# Patient Record
Sex: Female | Born: 1990 | Race: Black or African American | Hispanic: No | Marital: Single | Smoking: Former smoker
Health system: Southern US, Community
[De-identification: ages and names within clinical notes are randomized; demographics above are authoritative.]

## PROBLEM LIST (undated history)

## (undated) ENCOUNTER — Inpatient Hospital Stay (HOSPITAL_COMMUNITY): Payer: Self-pay

## (undated) DIAGNOSIS — N83209 Unspecified ovarian cyst, unspecified side: Secondary | ICD-10-CM

## (undated) DIAGNOSIS — W3400XA Accidental discharge from unspecified firearms or gun, initial encounter: Secondary | ICD-10-CM

## (undated) DIAGNOSIS — D219 Benign neoplasm of connective and other soft tissue, unspecified: Secondary | ICD-10-CM

## (undated) DIAGNOSIS — O133 Gestational [pregnancy-induced] hypertension without significant proteinuria, third trimester: Secondary | ICD-10-CM

## (undated) DIAGNOSIS — B999 Unspecified infectious disease: Secondary | ICD-10-CM

## (undated) DIAGNOSIS — N390 Urinary tract infection, site not specified: Secondary | ICD-10-CM

---
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
## 2010-07-21 LAB — CBC
HCT: 37.7 % (ref 36.0–46.0)
Hemoglobin: 12.6 g/dL (ref 12.0–15.0)
MCHC: 33.2 g/dL (ref 30.0–36.0)
MCHC: 33.4 g/dL (ref 30.0–36.0)
MCV: 90.6 fL (ref 78.0–100.0)
MCV: 91.1 fL (ref 78.0–100.0)
Platelets: 291 10*3/uL (ref 150–400)
Platelets: 329 10*3/uL (ref 150–400)
RBC: 4.16 MIL/uL (ref 3.87–5.11)
RBC: 4.21 MIL/uL (ref 3.87–5.11)
RDW: 13.3 % (ref 11.5–15.5)
RDW: 13.6 % (ref 11.5–15.5)
WBC: 16.3 10*3/uL — ABNORMAL HIGH (ref 4.0–10.5)

## 2010-07-21 LAB — RPR: RPR Ser Ql: NONREACTIVE

## 2010-10-22 ENCOUNTER — Emergency Department (HOSPITAL_COMMUNITY)
Admission: EM | Admit: 2010-10-22 | Discharge: 2010-10-22 | Disposition: A | Discharge status: Home / Self Care | Payer: No Typology Code available for payment source | Attending: Emergency Medicine | Admitting: Emergency Medicine

## 2010-10-22 DIAGNOSIS — S139XXA Sprain of joints and ligaments of unspecified parts of neck, initial encounter: Secondary | ICD-10-CM | POA: Insufficient documentation

## 2010-10-22 DIAGNOSIS — M549 Dorsalgia, unspecified: Secondary | ICD-10-CM | POA: Insufficient documentation

## 2010-10-22 DIAGNOSIS — S335XXA Sprain of ligaments of lumbar spine, initial encounter: Secondary | ICD-10-CM | POA: Insufficient documentation

## 2010-10-22 DIAGNOSIS — M542 Cervicalgia: Secondary | ICD-10-CM | POA: Insufficient documentation

## 2011-01-24 LAB — CBC
HCT: 32.5 — ABNORMAL LOW
Hemoglobin: 10.9 — ABNORMAL LOW
MCHC: 33.4
MCV: 80.3
MCV: 80.8
RDW: 15
WBC: 17.1 — ABNORMAL HIGH

## 2011-01-24 LAB — COMPREHENSIVE METABOLIC PANEL
Alkaline Phosphatase: 169 — ABNORMAL HIGH
BUN: 1 — ABNORMAL LOW
CO2: 25
Calcium: 8.8
Creatinine, Ser: 0.47
Sodium: 136
Total Bilirubin: 0.9
Total Protein: 5.7 — ABNORMAL LOW

## 2011-01-24 LAB — URINALYSIS, DIPSTICK ONLY
Nitrite: NEGATIVE
Protein, ur: NEGATIVE
Specific Gravity, Urine: 1.01
Urobilinogen, UA: 0.2
pH: 7

## 2011-01-24 LAB — LACTATE DEHYDROGENASE: LDH: 172

## 2011-01-24 LAB — RPR: RPR Ser Ql: NONREACTIVE

## 2011-02-11 LAB — URINALYSIS, ROUTINE W REFLEX MICROSCOPIC
Hgb urine dipstick: NEGATIVE
Nitrite: NEGATIVE
Urobilinogen, UA: 1
pH: 7.5

## 2011-02-11 LAB — URINE CULTURE: Colony Count: NO GROWTH

## 2011-02-11 LAB — URINE MICROSCOPIC-ADD ON

## 2011-02-14 LAB — URINALYSIS, ROUTINE W REFLEX MICROSCOPIC
Glucose, UA: NEGATIVE
Hgb urine dipstick: NEGATIVE
Ketones, ur: 15 — AB
Protein, ur: NEGATIVE
Urobilinogen, UA: 1
pH: 6

## 2011-02-14 LAB — GC/CHLAMYDIA PROBE AMP, GENITAL
Chlamydia, DNA Probe: NEGATIVE
GC Probe Amp, Genital: NEGATIVE

## 2011-02-14 LAB — WET PREP, GENITAL: Trich, Wet Prep: NONE SEEN

## 2011-12-06 ENCOUNTER — Emergency Department (HOSPITAL_COMMUNITY)
Admission: EM | Admit: 2011-12-06 | Discharge: 2011-12-06 | Disposition: A | Discharge status: Home / Self Care | Payer: Self-pay | Attending: Emergency Medicine | Admitting: Emergency Medicine

## 2011-12-06 ENCOUNTER — Encounter (HOSPITAL_COMMUNITY): Payer: Self-pay | Admitting: *Deleted

## 2011-12-06 DIAGNOSIS — T148XXA Other injury of unspecified body region, initial encounter: Secondary | ICD-10-CM

## 2011-12-06 DIAGNOSIS — F17200 Nicotine dependence, unspecified, uncomplicated: Secondary | ICD-10-CM | POA: Insufficient documentation

## 2011-12-06 DIAGNOSIS — IMO0002 Reserved for concepts with insufficient information to code with codable children: Secondary | ICD-10-CM | POA: Insufficient documentation
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
## 2011-12-06 NOTE — ED Provider Notes (Signed)
Medical screening examination/treatment/procedure(s) were performed by non-physician practitioner and as supervising physician I was immediately available for consultation/collaboration.   Loren Racer, MD 12/06/11 2223

## 2011-12-17 ENCOUNTER — Emergency Department (HOSPITAL_COMMUNITY)
Admission: EM | Admit: 2011-12-17 | Discharge: 2011-12-17 | Disposition: A | Discharge status: Home / Self Care | Payer: No Typology Code available for payment source | Attending: Emergency Medicine | Admitting: Emergency Medicine

## 2011-12-17 ENCOUNTER — Encounter (HOSPITAL_COMMUNITY): Payer: Self-pay | Admitting: Emergency Medicine

## 2011-12-17 ENCOUNTER — Emergency Department (HOSPITAL_COMMUNITY): Payer: No Typology Code available for payment source

## 2011-12-17 DIAGNOSIS — Z043 Encounter for examination and observation following other accident: Secondary | ICD-10-CM | POA: Insufficient documentation

## 2011-12-17 DIAGNOSIS — Y93I9 Activity, other involving external motion: Secondary | ICD-10-CM | POA: Insufficient documentation

## 2011-12-17 DIAGNOSIS — Y998 Other external cause status: Secondary | ICD-10-CM | POA: Insufficient documentation

## 2011-12-17 DIAGNOSIS — F17200 Nicotine dependence, unspecified, uncomplicated: Secondary | ICD-10-CM | POA: Insufficient documentation

## 2011-12-17 MED ORDER — OXYCODONE-ACETAMINOPHEN 5-325 MG PO TABS
2.0000 | ORAL_TABLET | Freq: Once | ORAL | Status: AC
  Administered 2011-12-17: 2 via ORAL
  Filled 2011-12-17: qty 2

## 2011-12-17 MED ORDER — DIAZEPAM 5 MG PO TABS: 5.0000 mg | ORAL_TABLET | Freq: Two times a day (BID) | ORAL | Status: AC

## 2011-12-17 MED ORDER — HYDROCODONE-ACETAMINOPHEN 5-325 MG PO TABS: 2.0000 | ORAL_TABLET | Freq: Four times a day (QID) | ORAL | Status: AC | PRN
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
## 2011-12-17 NOTE — ED Notes (Signed)
Pt removed from backboard

## 2011-12-19 NOTE — ED Provider Notes (Signed)
Medical screening examination/treatment/procedure(s) were performed by non-physician practitioner and as supervising physician I was immediately available for consultation/collaboration.  Codylee Patil K Linker, MD 12/19/11 0712 

## 2012-02-06 ENCOUNTER — Observation Stay (HOSPITAL_COMMUNITY)
Admission: EM | Admit: 2012-02-06 | Discharge: 2012-02-08 | Disposition: A | Discharge status: Home / Self Care | Payer: Medicaid Other | Attending: Orthopedic Surgery | Admitting: Orthopedic Surgery

## 2012-02-06 DIAGNOSIS — S81809A Unspecified open wound, unspecified lower leg, initial encounter: Secondary | ICD-10-CM | POA: Insufficient documentation

## 2012-02-06 DIAGNOSIS — Y998 Other external cause status: Secondary | ICD-10-CM | POA: Insufficient documentation

## 2012-02-06 DIAGNOSIS — S82209B Unspecified fracture of shaft of unspecified tibia, initial encounter for open fracture type I or II: Secondary | ICD-10-CM

## 2012-02-06 DIAGNOSIS — S82409B Unspecified fracture of shaft of unspecified fibula, initial encounter for open fracture type I or II: Principal | ICD-10-CM | POA: Insufficient documentation

## 2012-02-06 DIAGNOSIS — S81009A Unspecified open wound, unspecified knee, initial encounter: Secondary | ICD-10-CM | POA: Insufficient documentation

## 2012-02-06 DIAGNOSIS — W3400XA Accidental discharge from unspecified firearms or gun, initial encounter: Secondary | ICD-10-CM

## 2012-02-06 DIAGNOSIS — F17200 Nicotine dependence, unspecified, uncomplicated: Secondary | ICD-10-CM | POA: Insufficient documentation

## 2012-02-06 DIAGNOSIS — S82899B Other fracture of unspecified lower leg, initial encounter for open fracture type I or II: Secondary | ICD-10-CM | POA: Insufficient documentation

## 2012-02-06 MED ORDER — FENTANYL CITRATE 0.05 MG/ML IJ SOLN
50.0000 ug | Freq: Once | INTRAMUSCULAR | Status: AC
  Administered 2012-02-07: 50 ug via INTRAVENOUS
  Filled 2012-02-06: qty 2
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
## 2012-02-07 ENCOUNTER — Encounter (HOSPITAL_COMMUNITY)
Admission: EM | Disposition: A | Discharge status: Home / Self Care | Payer: Self-pay | Source: Home / Self Care | Attending: Emergency Medicine

## 2012-02-07 ENCOUNTER — Observation Stay (HOSPITAL_COMMUNITY): Payer: Medicaid Other

## 2012-02-07 ENCOUNTER — Observation Stay (HOSPITAL_COMMUNITY): Payer: Medicaid Other | Admitting: Certified Registered"

## 2012-02-07 ENCOUNTER — Encounter (HOSPITAL_COMMUNITY): Payer: Self-pay | Admitting: Emergency Medicine

## 2012-02-07 ENCOUNTER — Encounter (HOSPITAL_COMMUNITY): Payer: Self-pay | Admitting: Certified Registered"

## 2012-02-07 ENCOUNTER — Emergency Department (HOSPITAL_COMMUNITY): Payer: Medicaid Other

## 2012-02-07 HISTORY — PX: ORIF TIBIA FRACTURE: SHX5416

## 2012-02-07 HISTORY — PX: I & D EXTREMITY: SHX5045

## 2012-02-07 LAB — CBC
Hemoglobin: 12.9 g/dL (ref 12.0–15.0)
MCHC: 33.6 g/dL (ref 30.0–36.0)
Platelets: 227 10*3/uL (ref 150–400)
RDW: 13 % (ref 11.5–15.5)

## 2012-02-07 LAB — TYPE AND SCREEN
ABO/RH(D): B POS
Antibody Screen: NEGATIVE

## 2012-02-07 LAB — BASIC METABOLIC PANEL
Calcium: 9 mg/dL (ref 8.4–10.5)
GFR calc Af Amer: >90 mL/min (ref >90)
GFR calc non Af Amer: >90 mL/min (ref >90)
Potassium: 3.1 mEq/L — ABNORMAL LOW (ref 3.5–5.1)
Sodium: 143 mEq/L (ref 135–145)

## 2012-02-07 SURGERY — IRRIGATION AND DEBRIDEMENT EXTREMITY
Anesthesia: General | Site: Leg Lower | Laterality: Right | Wound class: Clean

## 2012-02-07 MED ORDER — SUCCINYLCHOLINE CHLORIDE 20 MG/ML IJ SOLN
INTRAMUSCULAR | Status: DC | PRN
  Administered 2012-02-07: 100 mg via INTRAVENOUS

## 2012-02-07 MED ORDER — LACTATED RINGERS IV SOLN
INTRAVENOUS | Status: DC | PRN
  Administered 2012-02-07 (×2): via INTRAVENOUS

## 2012-02-07 MED ORDER — ONDANSETRON HCL 4 MG PO TABS: 4.0000 mg | ORAL_TABLET | Freq: Four times a day (QID) | ORAL | Status: DC | PRN

## 2012-02-07 MED ORDER — EPHEDRINE SULFATE 50 MG/ML IJ SOLN
INTRAMUSCULAR | Status: DC | PRN
  Administered 2012-02-07: 15 mg via INTRAVENOUS

## 2012-02-07 MED ORDER — OXYCODONE HCL 5 MG PO TABS: 5.0000 mg | ORAL_TABLET | Freq: Once | ORAL | Status: DC | PRN

## 2012-02-07 MED ORDER — MIDAZOLAM HCL 5 MG/5ML IJ SOLN
INTRAMUSCULAR | Status: DC | PRN
  Administered 2012-02-07: 2 mg via INTRAVENOUS

## 2012-02-07 MED ORDER — PROMETHAZINE HCL 25 MG/ML IJ SOLN
6.2500 mg | INTRAMUSCULAR | Status: DC | PRN
  Administered 2012-02-07: 6.25 mg via INTRAVENOUS

## 2012-02-07 MED ORDER — METOCLOPRAMIDE HCL 5 MG/ML IJ SOLN: 5.0000 mg | Freq: Three times a day (TID) | INTRAMUSCULAR | Status: DC | PRN

## 2012-02-07 MED ORDER — LIDOCAINE HCL (CARDIAC) 20 MG/ML IV SOLN
INTRAVENOUS | Status: DC | PRN
  Administered 2012-02-07: 70 mg via INTRAVENOUS

## 2012-02-07 MED ORDER — TETANUS-DIPHTH-ACELL PERTUSSIS 5-2.5-18.5 LF-MCG/0.5 IM SUSP
0.5000 mL | Freq: Once | INTRAMUSCULAR | Status: AC
  Administered 2012-02-07: 0.5 mL via INTRAMUSCULAR
  Filled 2012-02-07: qty 0.5

## 2012-02-07 MED ORDER — PROPOFOL 10 MG/ML IV BOLUS
INTRAVENOUS | Status: DC | PRN
  Administered 2012-02-07: 150 mg via INTRAVENOUS

## 2012-02-07 MED ORDER — SUFENTANIL CITRATE 50 MCG/ML IV SOLN
INTRAVENOUS | Status: DC | PRN
  Administered 2012-02-07: 5 ug via INTRAVENOUS
  Administered 2012-02-07: 10 ug via INTRAVENOUS
  Administered 2012-02-07: 5 ug via INTRAVENOUS

## 2012-02-07 MED ORDER — MORPHINE SULFATE 4 MG/ML IJ SOLN
4.0000 mg | Freq: Once | INTRAMUSCULAR | Status: AC
  Administered 2012-02-07: 4 mg via INTRAVENOUS
  Filled 2012-02-07: qty 1

## 2012-02-07 MED ORDER — ONDANSETRON HCL 4 MG/2ML IJ SOLN
4.0000 mg | Freq: Once | INTRAMUSCULAR | Status: AC
  Administered 2012-02-07: 4 mg via INTRAVENOUS
  Filled 2012-02-07: qty 2

## 2012-02-07 MED ORDER — POTASSIUM CHLORIDE IN NACL 20-0.9 MEQ/L-% IV SOLN
INTRAVENOUS | Status: AC
  Filled 2012-02-07 (×4): qty 1000

## 2012-02-07 MED ORDER — METHOCARBAMOL 100 MG/ML IJ SOLN: 500.0000 mg | Freq: Four times a day (QID) | INTRAVENOUS | Status: DC | PRN

## 2012-02-07 MED ORDER — HYDROMORPHONE HCL PF 1 MG/ML IJ SOLN
1.0000 mg | Freq: Once | INTRAMUSCULAR | Status: AC
  Administered 2012-02-07: 1 mg via INTRAVENOUS
  Filled 2012-02-07: qty 1

## 2012-02-07 MED ORDER — METHOCARBAMOL 500 MG PO TABS
500.0000 mg | ORAL_TABLET | Freq: Four times a day (QID) | ORAL | Status: DC | PRN
  Administered 2012-02-07 – 2012-02-08 (×5): 500 mg via ORAL
  Filled 2012-02-07 (×4): qty 1

## 2012-02-07 MED ORDER — ONDANSETRON HCL 4 MG/2ML IJ SOLN: 4.0000 mg | Freq: Four times a day (QID) | INTRAMUSCULAR | Status: DC | PRN

## 2012-02-07 MED ORDER — CEFAZOLIN SODIUM 1-5 GM-% IV SOLN
1.0000 g | Freq: Once | INTRAVENOUS | Status: AC
  Administered 2012-02-07: 1 g via INTRAVENOUS
  Filled 2012-02-07: qty 50

## 2012-02-07 MED ORDER — MEPERIDINE HCL 25 MG/ML IJ SOLN: 6.2500 mg | INTRAMUSCULAR | Status: DC | PRN

## 2012-02-07 MED ORDER — HYDROMORPHONE HCL PF 1 MG/ML IJ SOLN: 0.2500 mg | INTRAMUSCULAR | Status: DC | PRN

## 2012-02-07 MED ORDER — ASPIRIN 325 MG PO TABS
325.0000 mg | ORAL_TABLET | Freq: Every day | ORAL | Status: DC
  Administered 2012-02-07 – 2012-02-08 (×2): 325 mg via ORAL
  Filled 2012-02-07 (×2): qty 1

## 2012-02-07 MED ORDER — OXYCODONE HCL 5 MG/5ML PO SOLN: 5.0000 mg | Freq: Once | ORAL | Status: DC | PRN

## 2012-02-07 MED ORDER — MORPHINE SULFATE 2 MG/ML IJ SOLN
1.0000 mg | INTRAMUSCULAR | Status: DC | PRN
  Administered 2012-02-07 – 2012-02-08 (×3): 1 mg via INTRAVENOUS
  Filled 2012-02-07: qty 1

## 2012-02-07 MED ORDER — CEFAZOLIN SODIUM 1-5 GM-% IV SOLN
1.0000 g | Freq: Three times a day (TID) | INTRAVENOUS | Status: AC
  Administered 2012-02-07 (×2): 1 g via INTRAVENOUS
  Filled 2012-02-07 (×4): qty 50

## 2012-02-07 MED ORDER — CEFAZOLIN SODIUM-DEXTROSE 2-3 GM-% IV SOLR
INTRAVENOUS | Status: DC | PRN
  Administered 2012-02-07: 2 g via INTRAVENOUS

## 2012-02-07 MED ORDER — METOCLOPRAMIDE HCL 5 MG PO TABS
5.0000 mg | ORAL_TABLET | Freq: Three times a day (TID) | ORAL | Status: DC | PRN
  Filled 2012-02-07: qty 2

## 2012-02-07 MED ORDER — CEFAZOLIN SODIUM 1-5 GM-% IV SOLN
INTRAVENOUS | Status: AC
  Filled 2012-02-07: qty 100

## 2012-02-07 MED ORDER — OXYCODONE HCL 5 MG PO TABS
5.0000 mg | ORAL_TABLET | ORAL | Status: DC | PRN
  Administered 2012-02-07 – 2012-02-08 (×7): 10 mg via ORAL
  Filled 2012-02-07 (×6): qty 2

## 2012-02-07 SURGICAL SUPPLY — 57 items
BANDAGE ELASTIC 4 VELCRO ST LF (GAUZE/BANDAGES/DRESSINGS) ×2 IMPLANT
BANDAGE ELASTIC 6 VELCRO ST LF (GAUZE/BANDAGES/DRESSINGS) ×1 IMPLANT
BANDAGE GAUZE ELAST BULKY 4 IN (GAUZE/BANDAGES/DRESSINGS) ×1 IMPLANT
BIT DRILL 2.9X70 QC CALB (BIT) ×1 IMPLANT
BNDG COHESIVE 4X5 TAN STRL (GAUZE/BANDAGES/DRESSINGS) IMPLANT
CLOTH BEACON ORANGE TIMEOUT ST (SAFETY) ×3 IMPLANT
COVER SURGICAL LIGHT HANDLE (MISCELLANEOUS) ×3 IMPLANT
CUFF TOURNIQUET SINGLE 18IN (TOURNIQUET CUFF) ×2 IMPLANT
CUFF TOURNIQUET SINGLE 24IN (TOURNIQUET CUFF) IMPLANT
CUFF TOURNIQUET SINGLE 34IN LL (TOURNIQUET CUFF) IMPLANT
CUFF TOURNIQUET SINGLE 44IN (TOURNIQUET CUFF) IMPLANT
DRAPE U-SHAPE 47X51 STRL (DRAPES) ×4 IMPLANT
DRSG KUZMA FLUFF (GAUZE/BANDAGES/DRESSINGS) ×1 IMPLANT
DRSG PAD ABDOMINAL 8X10 ST (GAUZE/BANDAGES/DRESSINGS) ×2 IMPLANT
DURAPREP 26ML APPLICATOR (WOUND CARE) ×3 IMPLANT
ELECT REM PT RETURN 9FT ADLT (ELECTROSURGICAL)
ELECTRODE REM PT RTRN 9FT ADLT (ELECTROSURGICAL) IMPLANT
FACESHIELD LNG OPTICON STERILE (SAFETY) ×2 IMPLANT
GAUZE XEROFORM 1X8 LF (GAUZE/BANDAGES/DRESSINGS) ×2 IMPLANT
GAUZE XEROFORM 5X9 LF (GAUZE/BANDAGES/DRESSINGS) IMPLANT
GLOVE BIOGEL PI IND STRL 8 (GLOVE) ×2 IMPLANT
GLOVE BIOGEL PI INDICATOR 8 (GLOVE) ×1
GLOVE SURG ORTHO 8.0 STRL STRW (GLOVE) ×3 IMPLANT
GOWN PREVENTION PLUS LG XLONG (DISPOSABLE) IMPLANT
GOWN PREVENTION PLUS XLARGE (GOWN DISPOSABLE) ×3 IMPLANT
GOWN STRL NON-REIN LRG LVL3 (GOWN DISPOSABLE) ×5 IMPLANT
HANDPIECE INTERPULSE COAX TIP (DISPOSABLE)
K-WIRE ACE 1.6X6 (WIRE) ×3
KIT BASIN OR (CUSTOM PROCEDURE TRAY) ×3 IMPLANT
KIT ROOM TURNOVER OR (KITS) ×3 IMPLANT
KWIRE ACE 1.6X6 (WIRE) IMPLANT
MANIFOLD NEPTUNE II (INSTRUMENTS) ×3 IMPLANT
NS IRRIG 1000ML POUR BTL (IV SOLUTION) ×3 IMPLANT
PACK ORTHO EXTREMITY (CUSTOM PROCEDURE TRAY) ×3 IMPLANT
PAD ARMBOARD 7.5X6 YLW CONV (MISCELLANEOUS) ×5 IMPLANT
PAD CAST 4YDX4 CTTN HI CHSV (CAST SUPPLIES) IMPLANT
PADDING CAST COTTON 4X4 STRL (CAST SUPPLIES)
SCREW ACE CAN 4.0 36M (Screw) ×1 IMPLANT
SCREW CANN ACE 4.0X32 (Screw) ×1 IMPLANT
SET HNDPC FAN SPRY TIP SCT (DISPOSABLE) IMPLANT
SPONGE GAUZE 4X4 12PLY (GAUZE/BANDAGES/DRESSINGS) ×2 IMPLANT
SPONGE LAP 18X18 X RAY DECT (DISPOSABLE) ×7 IMPLANT
SPONGE LAP 4X18 X RAY DECT (DISPOSABLE) ×2 IMPLANT
STOCKINETTE IMPERVIOUS 9X36 MD (GAUZE/BANDAGES/DRESSINGS) ×3 IMPLANT
SUT ETHILON 2 0 FS 18 (SUTURE) IMPLANT
SUT ETHILON 3 0 PS 1 (SUTURE) IMPLANT
SUT ETHILON 4 0 PS 2 18 (SUTURE) IMPLANT
SUT PROLENE 3 0 PS 2 (SUTURE) IMPLANT
SUT VIC AB 3-0 SH 27 (SUTURE)
SUT VIC AB 3-0 SH 27X BRD (SUTURE) IMPLANT
TOWEL OR 17X24 6PK STRL BLUE (TOWEL DISPOSABLE) ×3 IMPLANT
TOWEL OR 17X26 10 PK STRL BLUE (TOWEL DISPOSABLE) ×3 IMPLANT
TUBE ANAEROBIC SPECIMEN COL (MISCELLANEOUS) IMPLANT
TUBE CONNECTING 12X1/4 (SUCTIONS) ×3 IMPLANT
UNDERPAD 30X30 INCONTINENT (UNDERPADS AND DIAPERS) ×3 IMPLANT
WATER STERILE IRR 1000ML POUR (IV SOLUTION) ×3 IMPLANT
YANKAUER SUCT BULB TIP NO VENT (SUCTIONS) ×3 IMPLANT
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
## 2012-02-07 NOTE — Progress Notes (Signed)
Orthopedic Tech Progress Note Patient Details:  Paula Gilbert 1991/01/16 [id number]  Ortho Devices Type of Ortho Device: CAM walker Ortho Device/Splint Location: right cam walker Ortho Device/Splint Interventions: Application   Cammer, Mickie Bail 02/07/2012, 9:10 AM

## 2012-02-07 NOTE — ED Notes (Signed)
Dr Dean at bedside.
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
## 2012-02-07 NOTE — Progress Notes (Signed)
Dictating addendum to the H&P ulceration at worst reviewed the CT scan and it does show the fracture extends down into the joint. This will require screw fixation in order to maintain the reduced articular surface. Don't think anything more extensive such as waiting will be required. I think screw fixation to hold the articular surface and positioned along with for 6 weeks of short leg casting should be adequate for this to heal. All questions answered patient understands the additional procedure required nor to fix the right ankle.

## 2012-02-07 NOTE — ED Notes (Signed)
Consent signed and pt to or.

## 2012-02-07 NOTE — ED Notes (Signed)
Introduced self to patient, and pt dressing to left ankle changed.
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
## 2012-02-07 NOTE — Anesthesia Preprocedure Evaluation (Addendum)
Anesthesia Evaluation  Patient identified by MRN, date of birth, ID band Patient awake, Patient confused and Patient unresponsive    Reviewed: Allergy & Precautions, H&P , NPO status   Airway Mallampati: I  Neck ROM: full    Dental  (+) Teeth Intact, Caps and Dental Advidsory Given   Pulmonary Current Smoker,  Smokes 1pk/dy X 52yrs breath sounds clear to auscultation        Cardiovascular Exercise Tolerance: Good negative cardio ROS  Rhythm:Regular Rate:Normal     Neuro/Psych negative neurological ROS     GI/Hepatic negative GI ROS, Neg liver ROS,   Endo/Other  negative endocrine ROS  Renal/GU negative Renal ROS  negative genitourinary   Musculoskeletal   Abdominal   Peds  Hematology negative hematology ROS (+)   Anesthesia Other Findings   Reproductive/Obstetrics negative OB ROS                          Anesthesia Physical Anesthesia Plan  ASA: I and Emergent  Anesthesia Plan: General   Post-op Pain Management:    Induction: Intravenous, Rapid sequence and Cricoid pressure planned  Airway Management Planned: Oral ETT  Additional Equipment:   Intra-op Plan:   Post-operative Plan: Extubation in OR  Informed Consent: I have reviewed the patients History and Physical, chart, labs and discussed the procedure including the risks, benefits and alternatives for the proposed anesthesia with the patient or authorized representative who has indicated his/her understanding and acceptance.   Dental Advisory Given and Dental advisory given  Plan Discussed with: Anesthesiologist, CRNA and Surgeon  Anesthesia Plan Comments:         Anesthesia Quick Evaluation

## 2012-02-07 NOTE — ED Notes (Signed)
Pt family informed multiple times that only 2 visitors per room, due to acuity of pts on assignment and due to patients injuires for safety

## 2012-02-07 NOTE — Anesthesia Postprocedure Evaluation (Signed)
  Anesthesia Post-op Note  Patient: Paula Gilbert  Procedure(s) Performed: Procedure(s) (LRB) with comments: IRRIGATION AND DEBRIDEMENT EXTREMITY (Bilateral) OPEN REDUCTION INTERNAL FIXATION (ORIF) TIBIA FRACTURE (Right)  Patient Location: PACU  Anesthesia Type: General  Level of Consciousness: awake, alert , oriented and patient cooperative  Airway and Oxygen Therapy: Patient Spontanous Breathing  Post-op Pain: mild  Post-op Assessment: Post-op Vital signs reviewed, Patient's Cardiovascular Status Stable, Respiratory Function Stable, Patent Airway, No signs of Nausea or vomiting and Pain level controlled  Post-op Vital Signs: Reviewed and stable  Complications: No apparent anesthesia complications

## 2012-02-07 NOTE — ED Notes (Signed)
Pt to ct 

## 2012-02-07 NOTE — Anesthesia Procedure Notes (Signed)
Procedure Name: Intubation Date/Time: 02/07/2012 6:48 AM Performed by: Alanda Amass A Pre-anesthesia Checklist: Patient identified, Timeout performed, Emergency Drugs available, Suction available and Patient being monitored Patient Re-evaluated:Patient Re-evaluated prior to inductionOxygen Delivery Method: Circle system utilized Preoxygenation: Pre-oxygenation with 100% oxygen Intubation Type: IV induction Laryngoscope Size: Mac and 3 Grade View: Grade II Tube type: Oral Tube size: 8.0 mm Number of attempts: 1 Airway Equipment and Method: Stylet

## 2012-02-08 MED ORDER — ASPIRIN 325 MG PO TABS: 325.0000 mg | ORAL_TABLET | Freq: Every day | ORAL | Status: DC

## 2012-02-08 MED ORDER — METHOCARBAMOL 500 MG PO TABS: 500.0000 mg | ORAL_TABLET | Freq: Four times a day (QID) | ORAL | Status: DC | PRN

## 2012-02-08 MED ORDER — OXYCODONE HCL 5 MG PO TABS: 5.0000 mg | ORAL_TABLET | ORAL | Status: DC | PRN
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
## 2012-02-08 NOTE — Progress Notes (Signed)
Pt stable Left leg doing well - can wb on this side rle toes mobile and perfused - sens diminished Plan dc today after PT Will need [facility] for PT and blood draws for coumadin

## 2012-02-08 NOTE — Progress Notes (Signed)
   CARE MANAGEMENT NOTE 02/08/2012  Patient:  ZOXWRUE,[id number]   Account Number:  [id number]  Date Initiated:  02/08/2012  Documentation initiated by:  [facility]  Subjective/Objective Assessment:     Action/Plan:   HH, DME   Anticipated DC Date:  02/08/2012   Anticipated DC Plan:  HOME W HOME HEALTH SERVICES      DC Planning Services  CM consult      [facility] Choice  HOME HEALTH   Choice offered to / List presented to:  C-1 Patient      DME agency  [facility]     HH arranged  HH-2 PT  HH-3 OT      [facility] agency  [facility]   Status of service:  Completed, signed off Medicare Important Message given?   (If response is "NO", the following Medicare IM given date fields will be blank) Date Medicare IM given:   Date Additional Medicare IM given:    Discharge Disposition:  HOME W HOME HEALTH SERVICES  Per UR Regulation:    If discussed at Long Length of Stay Meetings, dates discussed:    Comments:  02/08/2012 1430 NCM spoke to pt and offered choice for [facility] Pt states no preference for HH. States she does have Medicaid. Pt listed as self-pay. Provided Unit RN with [facility] contact info to add to pt's d/c instructions for home. NCM instructed pt to contact [facility] with Medicaid information. NCM contacted pt accounting and left vm. Contacted [facility] with referral for [facility] DME delivered by [facility]. Isidoro Donning RN CCM Case Mgmt phone [phone]

## 2012-02-09 ENCOUNTER — Encounter (HOSPITAL_COMMUNITY): Payer: Self-pay | Admitting: Orthopedic Surgery

## 2012-02-10 NOTE — Progress Notes (Signed)
Utilization review completed. Cairo Agostinelli, RN, BSN. 
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
## 2012-08-12 ENCOUNTER — Ambulatory Visit (INDEPENDENT_AMBULATORY_CARE_PROVIDER_SITE_OTHER): Payer: Medicaid Other | Admitting: Obstetrics & Gynecology

## 2012-08-12 ENCOUNTER — Encounter: Payer: Self-pay | Admitting: Obstetrics & Gynecology

## 2012-08-12 VITALS — BP 109/75 | HR 64 | Temp 98.6°F | Ht 67.0 in | Wt 122.0 lb

## 2012-08-12 DIAGNOSIS — Z3046 Encounter for surveillance of implantable subdermal contraceptive: Secondary | ICD-10-CM | POA: Insufficient documentation

## 2012-08-12 DIAGNOSIS — Z789 Other specified health status: Secondary | ICD-10-CM

## 2012-08-12 MED ORDER — MEDROXYPROGESTERONE ACETATE 150 MG/ML IM SUSP: 150.0000 mg | INTRAMUSCULAR | Status: DC

## 2012-08-12 NOTE — Patient Instructions (Signed)

## 2012-08-12 NOTE — Progress Notes (Deleted)
NEXPLANON REMOVAL NOTE  Date of LMP:   08/05/12  Contraception used: *Nexplanon  Pregnancy test result:  No results found for this basename: PREGTESTUR, PREGSERUM, HCG, HCGQUANT    Indications:  The patient desires contraception.  She understands risks, benefits, and alternatives to Implanon and would like to proceed.  Anesthesia:   Lidocaine 1% plain.  Procedure:  A time-out was performed confirming the procedure and the patient's allergy status.  The patient's non-dominant was identified as the {left/right:311354} arm.  The protection cap was removed. While placing countertraction on the skin, the needle was inserted at a 30 degree angle.  The applicator was held horizontal to the skin; the skin was tented upward as the needle was introduced into the subdermal space.  While holding the applicator in place, the slider was unlocked. The Nexplanon was removed from the field.  The Nexplanon was palpated to ensure proper placement.  Complications: {Mis complications:31741}  Instructions:  The patient was instructed to remove the dressing in 24 hours and that some bruising is to be expected.  She was advised to use over the counter analgesics as needed for any pain at the site.  She is to keep the area dry for 24 hours and to call if her hand or arm becomes cold, numb, or blue.  Return visit:  Return in {1-6 weeks:20341} Pt would like to use the Depo for contraception after removal.

## 2012-08-12 NOTE — Progress Notes (Signed)
Reasons  for removal:  Due for removal   A timeout was performed confirming the patient, the procedure and allergy status. The patient's left  arm was palpated and the implant device located. The area was prepped with Betadinex3. The distal end of the device was palpated and 2 cc of 1% lidocaine was injected. A 2 mm incision was made. Any fibrotic tissue was carefully dissected away using blunt and/or sharp dissection. The device was removed in an intact manner. Steri-strips and a sterile dressing were applied to the incision. The patient tolerated the procedure well.  New contraceptive method: Depo-Provera

## 2012-10-13 ENCOUNTER — Ambulatory Visit: Payer: Medicaid Other | Admitting: Obstetrics & Gynecology

## 2013-02-15 ENCOUNTER — Ambulatory Visit (INDEPENDENT_AMBULATORY_CARE_PROVIDER_SITE_OTHER): Payer: Medicaid Other | Admitting: *Deleted

## 2013-02-15 VITALS — BP 120/77 | HR 91 | Temp 99.3°F | Wt 124.0 lb

## 2013-02-15 DIAGNOSIS — Z3201 Encounter for pregnancy test, result positive: Secondary | ICD-10-CM | POA: Diagnosis not present

## 2013-02-15 LAB — POCT URINE PREGNANCY: Preg Test, Ur: POSITIVE

## 2013-02-15 NOTE — Progress Notes (Signed)
Pt in office today for a confirmation of pregnancy. Pt states she had a positive home pregnancy test about 1 month ago.  Pregnancy test in office is positive.  Pt advised to schedule a NOB appointment and to start on prenatal vitamin. Pt given samples of prenatal vitamins.

## 2013-03-08 ENCOUNTER — Encounter (HOSPITAL_COMMUNITY): Payer: Self-pay | Admitting: Emergency Medicine

## 2013-03-08 ENCOUNTER — Emergency Department (HOSPITAL_COMMUNITY): Payer: Medicaid Other

## 2013-03-08 ENCOUNTER — Emergency Department (HOSPITAL_COMMUNITY)
Admission: EM | Admit: 2013-03-08 | Discharge: 2013-03-08 | Disposition: A | Discharge status: Home / Self Care | Payer: Medicaid Other | Attending: Emergency Medicine | Admitting: Emergency Medicine

## 2013-03-08 DIAGNOSIS — O99330 Smoking (tobacco) complicating pregnancy, unspecified trimester: Secondary | ICD-10-CM | POA: Insufficient documentation

## 2013-03-08 DIAGNOSIS — O039 Complete or unspecified spontaneous abortion without complication: Secondary | ICD-10-CM | POA: Insufficient documentation

## 2013-03-08 DIAGNOSIS — Z7982 Long term (current) use of aspirin: Secondary | ICD-10-CM | POA: Insufficient documentation

## 2013-03-08 DIAGNOSIS — N939 Abnormal uterine and vaginal bleeding, unspecified: Secondary | ICD-10-CM

## 2013-03-08 LAB — COMPREHENSIVE METABOLIC PANEL
AST: 12 U/L (ref 0–37)
Albumin: 4.3 g/dL (ref 3.5–5.2)
Alkaline Phosphatase: 58 U/L (ref 39–117)
Chloride: 103 mEq/L (ref 96–112)
Creatinine, Ser: 0.53 mg/dL (ref 0.50–1.10)
Potassium: 3.3 mEq/L — ABNORMAL LOW (ref 3.5–5.1)
Sodium: 139 mEq/L (ref 135–145)
Total Bilirubin: 0.8 mg/dL (ref 0.3–1.2)

## 2013-03-08 LAB — CBC WITH DIFFERENTIAL/PLATELET
Basophils Absolute: 0 10*3/uL (ref 0.0–0.1)
Basophils Relative: 0 % (ref 0–1)
MCHC: 35.1 g/dL (ref 30.0–36.0)
Neutro Abs: 6.7 10*3/uL (ref 1.7–7.7)
Neutrophils Relative %: 63 % (ref 43–77)
Platelets: 277 10*3/uL (ref 150–400)
RDW: 12.7 % (ref 11.5–15.5)

## 2013-03-08 LAB — URINE MICROSCOPIC-ADD ON

## 2013-03-08 LAB — URINALYSIS, ROUTINE W REFLEX MICROSCOPIC
Glucose, UA: NEGATIVE mg/dL
Ketones, ur: NEGATIVE mg/dL
pH: 6 (ref 5.0–8.0)

## 2013-03-08 LAB — ABO/RH: ABO/RH(D): B POS

## 2013-03-08 LAB — WET PREP, GENITAL: Trich, Wet Prep: NONE SEEN

## 2013-03-08 NOTE — ED Provider Notes (Signed)
CSN: [id number]     Arrival date & time 03/08/13  1955 History   First MD Initiated Contact with Patient 03/08/13 2055     Chief Complaint  Patient presents with  . Vaginal Bleeding   (Consider location/radiation/quality/duration/timing/severity/associated sxs/prior Treatment) HPI Pt here for vaginal bleeding. She states she is 4 months pregnant. Onset was one episode of bleeding found on underwear 3 days ago, occurred again just prior to arrival.  The pain is mild, described as lower cramping. Modifying factors: none.  Associated symptoms: no fever, no emesis, no recent trauma.  Recent medical care: none. One prior OB visit for this pregnancy. No ultrasound.    History reviewed. No pertinent past medical history. Past Surgical History  Procedure Laterality Date  . I&d extremity  02/07/2012    Procedure: IRRIGATION AND DEBRIDEMENT EXTREMITY;  Surgeon: Cammy Copa, MD;  Location: [facility] OR;  Service: Orthopedics;  Laterality: Bilateral;  . Orif tibia fracture  02/07/2012    Procedure: OPEN REDUCTION INTERNAL FIXATION (ORIF) TIBIA FRACTURE;  Surgeon: Cammy Copa, MD;  Location: [facility] OR;  Service: Orthopedics;  Laterality: Right;   Family History  Problem Relation Age of Onset  . Hypertension Paternal Grandfather   . Hypertension Maternal Grandmother   . Hypertension Mother   . Hypertension Maternal Grandfather    History  Substance Use Topics  . Smoking status: Current Every Day Smoker -- 0.50 packs/day for 3 years    Types: Cigarettes  . Smokeless tobacco: Not on file  . Alcohol Use: No   OB History   Grav Para Term Preterm Abortions TAB SAB Ect Mult Living   1              Review of Systems Constitutional: Negative for fever.  Eyes: Negative for vision loss.  ENT: Negative for difficulty swallowing.  Cardiovascular: Negative for chest pain. Respiratory: Negative for respiratory distress.  Gastrointestinal:  Negative for vomiting.  Genitourinary: Negative for  inability to void.  Musculoskeletal: Negative for gait problem.  Integumentary: Negative for rash.  Neurological: Negative for new focal weakness.     Allergies  Review of patient's allergies indicates no known allergies.  Home Medications   Current Outpatient Rx  Name  Route  Sig  Dispense  Refill  . aspirin 325 MG tablet   Oral   Take 1 tablet (325 mg total) by mouth daily.   30 tablet   0    BP 120/77  Pulse 66  Temp(Src) 98.4 F (36.9 C) (Oral)  Resp 14  Wt 125 lb (56.7 kg)  SpO2 100%  LMP 11/26/2012 Physical Exam Nursing note and vitals reviewed.  Constitutional: Pt is alert and appears stated age. Eyes: No injection, no scleral icterus. HENT: Atraumatic, airway open without erythema or exudate.  Respiratory: No respiratory distress. Equal breathing bilaterally. Cardiovascular: Normal rate. Extremities warm and well perfused.  Abdomen: Soft, non-tender. MSK: Extremities are atraumatic without deformity. Skin: No rash, no wounds.   Neuro: No motor nor sensory deficit. GU: Normal external vagina. Scant dark blood in vaginal vault. Os closed. No tissue. No cervical or uterine tenderness.     ED Course  Procedures (including critical care time) Labs Review Labs Reviewed  CBC WITH DIFFERENTIAL - Abnormal; Notable for the following:    WBC 10.7 (*)    All other components within normal limits  COMPREHENSIVE METABOLIC PANEL - Abnormal; Notable for the following:    Potassium 3.3 (*)    All other components within normal limits  HCG, QUANTITATIVE, PREGNANCY - Abnormal; Notable for the following:    hCG, Beta Chain, Quant, S 8103 (*)    All other components within normal limits  URINALYSIS, ROUTINE W REFLEX MICROSCOPIC - Abnormal; Notable for the following:    APPearance CLOUDY (*)    Hgb urine dipstick LARGE (*)    Leukocytes, UA SMALL (*)    All other components within normal limits  URINE MICROSCOPIC-ADD ON - Abnormal; Notable for the following:     Squamous Epithelial / LPF FEW (*)    Bacteria, UA FEW (*)    Casts HYALINE CASTS (*)    All other components within normal limits  POCT PREGNANCY, URINE - Abnormal; Notable for the following:    Preg Test, Ur POSITIVE (*)    All other components within normal limits  URINE CULTURE  GC/CHLAMYDIA PROBE AMP  WET PREP, GENITAL  ABO/RH   Imaging Review No results found.  EKG Interpretation   None       MDM   1. Vaginal bleeding   2. Miscarriage    22 y.o. female G3P2 presents w/ vaginal bleeding in context of pregnancy. LMP 7/25, pt about 14 weeks by dates. No prior US. Pt looks well, normal vitals. Plan to r/u ectopic.   Labs reviewed and are unremarkable. No signs of anemia. [place] with empty and misshapen gestational sac most compatible with fetal demise or an anembryonic pregnancy. Pt informed of results. She is doing well on re-eval. Rh +. Plan to follow up with ob/gyn. Counseling provided regarding diagnosis, treatment plan, follow up recommendations, and return precautions. Questions answered.       I independently viewed, interpreted, and used in my medical decision making all ordered lab and imaging tests. Medical Decision Making discussed with ED attending Toy Baker, MD      Charm Barges, MD 03/08/13 [phone]

## 2013-03-08 NOTE — ED Notes (Signed)
Pt did not respond to call from waiting room x 1  

## 2013-03-08 NOTE — ED Notes (Signed)
Pt. reports vaginal bleeding onset this evening with mild low abdominal cramping , pt. stated she is 4 months pregnant ( G3P2 ) .

## 2013-03-09 LAB — GC/CHLAMYDIA PROBE AMP: CT Probe RNA: NEGATIVE

## 2013-03-10 ENCOUNTER — Encounter: Payer: Self-pay | Admitting: Obstetrics

## 2013-03-10 ENCOUNTER — Encounter: Payer: Medicaid Other | Admitting: Obstetrics

## 2013-03-10 ENCOUNTER — Ambulatory Visit: Payer: Medicaid Other | Admitting: Obstetrics

## 2013-03-10 ENCOUNTER — Ambulatory Visit (INDEPENDENT_AMBULATORY_CARE_PROVIDER_SITE_OTHER): Payer: Medicaid Other | Admitting: Obstetrics

## 2013-03-10 VITALS — BP 109/72 | HR 75 | Temp 98.5°F | Ht 67.0 in | Wt 122.0 lb

## 2013-03-10 DIAGNOSIS — Z0000 Encounter for general adult medical examination without abnormal findings: Secondary | ICD-10-CM

## 2013-03-10 DIAGNOSIS — O039 Complete or unspecified spontaneous abortion without complication: Secondary | ICD-10-CM | POA: Insufficient documentation

## 2013-03-10 DIAGNOSIS — Z3202 Encounter for pregnancy test, result negative: Secondary | ICD-10-CM

## 2013-03-10 LAB — POCT URINALYSIS DIPSTICK
Bilirubin, UA: NEGATIVE
Blood, UA: NEGATIVE
Glucose, UA: NEGATIVE
Ketones, UA: NEGATIVE
Leukocytes, UA: NEGATIVE
Nitrite, UA: NEGATIVE
Spec Grav, UA: 1.01
Urobilinogen, UA: NEGATIVE
pH, UA: 8

## 2013-03-10 LAB — URINE CULTURE: Colony Count: >=100000

## 2013-03-10 LAB — POCT URINE PREGNANCY: Preg Test, Ur: POSITIVE

## 2013-03-10 NOTE — Progress Notes (Signed)
New patient in office following recent miscarriage, reports light pink discharge, not currently using any contraceptives, denies pain or discomfort, educational review complete. PE;       Abdomen - soft, NT.  A/P:  SAB - complete.  Doing well.            Contraceptive options discussed.  Considering IUD.           F/U 2 weeks.

## 2013-03-10 NOTE — ED Provider Notes (Signed)
I saw and evaluated the patient, reviewed the resident's note and I agree with the findings and plan.  Toy Baker, MD 03/10/13 1921

## 2013-03-24 ENCOUNTER — Ambulatory Visit: Payer: Medicaid Other | Admitting: Obstetrics

## 2013-03-29 ENCOUNTER — Encounter (HOSPITAL_COMMUNITY): Payer: Self-pay | Admitting: Emergency Medicine

## 2013-03-29 ENCOUNTER — Emergency Department (HOSPITAL_COMMUNITY): Payer: Medicaid Other

## 2013-03-29 ENCOUNTER — Emergency Department (HOSPITAL_COMMUNITY)
Admission: EM | Admit: 2013-03-29 | Discharge: 2013-03-29 | Disposition: A | Discharge status: Home / Self Care | Payer: Medicaid Other | Attending: Emergency Medicine | Admitting: Emergency Medicine

## 2013-03-29 DIAGNOSIS — O99330 Smoking (tobacco) complicating pregnancy, unspecified trimester: Secondary | ICD-10-CM | POA: Insufficient documentation

## 2013-03-29 DIAGNOSIS — Z79899 Other long term (current) drug therapy: Secondary | ICD-10-CM | POA: Insufficient documentation

## 2013-03-29 DIAGNOSIS — O039 Complete or unspecified spontaneous abortion without complication: Secondary | ICD-10-CM

## 2013-03-29 LAB — URINALYSIS, ROUTINE W REFLEX MICROSCOPIC
Glucose, UA: NEGATIVE mg/dL
Nitrite: NEGATIVE
Protein, ur: NEGATIVE mg/dL
pH: 6 (ref 5.0–8.0)

## 2013-03-29 LAB — URINE MICROSCOPIC-ADD ON

## 2013-03-29 LAB — CBC
Hemoglobin: 13.3 g/dL (ref 12.0–15.0)
Platelets: 258 10*3/uL (ref 150–400)
RBC: 4.19 MIL/uL (ref 3.87–5.11)
WBC: 13 10*3/uL — ABNORMAL HIGH (ref 4.0–10.5)

## 2013-03-29 LAB — WET PREP, GENITAL
Clue Cells Wet Prep HPF POC: NONE SEEN
Trich, Wet Prep: NONE SEEN
Yeast Wet Prep HPF POC: NONE SEEN

## 2013-03-29 LAB — PREGNANCY, URINE: Preg Test, Ur: POSITIVE — AB

## 2013-03-29 MED ORDER — HYDROCODONE-ACETAMINOPHEN 5-325 MG PO TABS: 1.0000 | ORAL_TABLET | Freq: Four times a day (QID) | ORAL | Status: DC | PRN

## 2013-03-29 MED ORDER — ONDANSETRON HCL 4 MG/2ML IJ SOLN
4.0000 mg | Freq: Once | INTRAMUSCULAR | Status: AC
  Administered 2013-03-29: 4 mg via INTRAVENOUS
  Filled 2013-03-29: qty 2

## 2013-03-29 MED ORDER — MORPHINE SULFATE 4 MG/ML IJ SOLN
4.0000 mg | Freq: Once | INTRAMUSCULAR | Status: AC
  Administered 2013-03-29: 4 mg via INTRAVENOUS
  Filled 2013-03-29: qty 1

## 2013-03-29 MED ORDER — SODIUM CHLORIDE 0.9 % IV BOLUS (SEPSIS)
1000.0000 mL | Freq: Once | INTRAVENOUS | Status: AC
  Administered 2013-03-29: 1000 mL via INTRAVENOUS

## 2013-03-29 NOTE — ED Notes (Signed)
Pt updated about Dr. Jeraldine Loots waiting for OBGYN MD to call back. Jeraldine Loots reports to keep pt NPO for right now.

## 2013-03-29 NOTE — ED Notes (Signed)
After the pelvic exam pt delivered a large blood clot, looking like placenta. MD and PA notified. Pt very upset, asked if she would like the staff to call anyone for her, she said.

## 2013-03-29 NOTE — ED Notes (Signed)
Pt reports that at 6am this morning she woke up with bad cramps, then went to the bathroom and had a large blood clot. Pt reports it feels like she is in labor. LMP July 2014. Reports she was recently pregnant and told about 3 weeks ago she was having a miscarriage. sts that 3 weeks ago she had vaginal bleeding but no pain. Pt c/o pain across lower abd. reports she is feeling a little lightheaded.

## 2013-03-29 NOTE — ED Provider Notes (Signed)
CSN: [id number]     Arrival date & time 03/29/13  0807 History   First MD Initiated Contact with Patient 03/29/13 [phone]     Chief Complaint  Patient presents with  . Vaginal Bleeding  . Abdominal Pain   (Consider location/radiation/quality/duration/timing/severity/associated sxs/prior Treatment) HPI Comments: Patient who is currently G3P2 presents with a chief complaint of vaginal bleeding.  She is currently pregnant.  LMP was 07/25.  She was seen in the ED on 03/08/13 for vaginal bleeding.   At that time she had an ultrasound done, which showed an empty and misshapen gestational sac most compatible with fetal demise or anembryonic pregnancy.  She reports that she has had vaginal spotting since that time, but this morning was passing large blood clots and having intense pelvic cramping.  She reports that the cramping felt like "labor pains."  She has not taken anything for pain prior to arrival.  She reports that she is feeling slightly lightheaded, but no syncope.  She denies nausea or vomiting.  OB/GYN is Dr. Gaynell Face.  The history is provided by the patient.    History reviewed. No pertinent past medical history. Past Surgical History  Procedure Laterality Date  . I&d extremity  02/07/2012    Procedure: IRRIGATION AND DEBRIDEMENT EXTREMITY;  Surgeon: Cammy Copa, MD;  Location: [facility];  Service: Orthopedics;  Laterality: Bilateral;  . Orif tibia fracture  02/07/2012    Procedure: OPEN REDUCTION INTERNAL FIXATION (ORIF) TIBIA FRACTURE;  Surgeon: Cammy Copa, MD;  Location: [facility] OR;  Service: Orthopedics;  Laterality: Right;   Family History  Problem Relation Age of Onset  . Hypertension Paternal Grandfather   . Hypertension Maternal Grandmother   . Diabetes Maternal Grandmother   . Hypertension Mother   . Hypertension Maternal Grandfather   . Diabetes Paternal Grandmother    History  Substance Use Topics  . Smoking status: Current Every Day Smoker -- 0.50 packs/day for 3  years    Types: Cigarettes  . Smokeless tobacco: Not on file  . Alcohol Use: No   OB History   Grav Para Term Preterm Abortions TAB SAB Ect Mult Living   1              Review of Systems  All other systems reviewed and are negative.    Allergies  Review of patient's allergies indicates no known allergies.  Home Medications   Current Outpatient Rx  Name  Route  Sig  Dispense  Refill  . Prenatal Vit-Fe Fumarate-FA (MULTIVITAMIN-PRENATAL) 27-0.8 MG TABS tablet   Oral   Take 1 tablet by mouth daily at 12 noon.          BP 111/79  Pulse 66  Temp(Src) 97.3 F (36.3 C) (Oral)  Resp 16  SpO2 100%  LMP 03/08/2013 Physical Exam  Nursing note and vitals reviewed. Constitutional: She appears well-developed and well-nourished.  HENT:  Head: Normocephalic and atraumatic.  Mouth/Throat: Oropharynx is clear and moist.  Neck: Neck supple.  Cardiovascular: Normal rate, regular rhythm and normal heart sounds.   Pulmonary/Chest: Effort normal and breath sounds normal.  Abdominal: Soft. Bowel sounds are normal. She exhibits no distension and no mass. There is tenderness in the suprapubic area and left lower quadrant. There is no rebound and no guarding.  Genitourinary: Right adnexum displays no mass, no tenderness and no fullness. Left adnexum displays tenderness. Left adnexum displays no mass.  Large amount of blood and tissue in the vaginal vault  Musculoskeletal: Normal range of  motion.  Neurological: She is alert.  Skin: Skin is warm and dry.  Psychiatric: She has a normal mood and affect.    ED Course  Procedures (including critical care time) Labs Review Labs Reviewed  GC/CHLAMYDIA PROBE AMP  WET PREP, GENITAL  PREGNANCY, URINE  CBC  URINALYSIS, ROUTINE W REFLEX MICROSCOPIC   Imaging Review US Ob Comp Less 14 Wks  03/29/2013   CLINICAL DATA:  Abdominal pain. Vaginal bleeding. Positive urine pregnancy test.  EXAM: OBSTETRIC <14 WK [place] AND TRANSVAGINAL OB US   TECHNIQUE: Both transabdominal and transvaginal ultrasound examinations were performed for complete evaluation of the gestation as well as the maternal uterus, adnexal regions, and pelvic cul-de-sac. Transvaginal technique was performed to assess early pregnancy.  COMPARISON:  03/08/2013  FINDINGS: Intrauterine gestational sac: The gestational sac noted in the uterus on the prior study is no longer seen.  Yolk sac:  None  Embryo:  None  Maternal uterus/adnexae: There is complex material extending from the lower endometrial canal into the endocervical canal, distending the endocervical canal by 12 mm. This has the appearance of thrombus possibly retained products of conception. The endometrium above this measures 11 mm in thickness. No uterine mass. The external cervical os appears closed.  Ovaries are normal in size in echogenicity.  Small amount pelvic free fluid.  No adnexal masses.  IMPRESSION: 1. The misshapen gestational sac noted on the prior study is no longer present. There is no evidence of an intrauterine pregnancy. There is heterogeneous material distending the lower endometrial canal and the endocervical canal consistent with thrombus and possibly products of conception. 2. Normal ovaries.  No adnexal masses. 3. Small amount pelvic free fluid.   Electronically Signed   By: Amie Portland M.D.   On: 03/29/2013 11:47   US Ob Transvaginal  03/29/2013   CLINICAL DATA:  Abdominal pain. Vaginal bleeding. Positive urine pregnancy test.  EXAM: OBSTETRIC <14 WK [place] AND TRANSVAGINAL OB US  TECHNIQUE: Both transabdominal and transvaginal ultrasound examinations were performed for complete evaluation of the gestation as well as the maternal uterus, adnexal regions, and pelvic cul-de-sac. Transvaginal technique was performed to assess early pregnancy.  COMPARISON:  03/08/2013  FINDINGS: Intrauterine gestational sac: The gestational sac noted in the uterus on the prior study is no longer seen.  Yolk sac:  None   Embryo:  None  Maternal uterus/adnexae: There is complex material extending from the lower endometrial canal into the endocervical canal, distending the endocervical canal by 12 mm. This has the appearance of thrombus possibly retained products of conception. The endometrium above this measures 11 mm in thickness. No uterine mass. The external cervical os appears closed.  Ovaries are normal in size in echogenicity.  Small amount pelvic free fluid.  No adnexal masses.  IMPRESSION: 1. The misshapen gestational sac noted on the prior study is no longer present. There is no evidence of an intrauterine pregnancy. There is heterogeneous material distending the lower endometrial canal and the endocervical canal consistent with thrombus and possibly products of conception. 2. Normal ovaries.  No adnexal masses. 3. Small amount pelvic free fluid.   Electronically Signed   By: Amie Portland M.D.   On: 03/29/2013 11:47    EKG Interpretation   None     During ED course the patient expelled a large amount of bloody vaginal tissue.  She reports that the pain improved after this.    10:17 AM Discussed with Dr. Gaynell Face with OB/GYN.  He recommends getting an ultrasound.  2:00 PM Discussed the results of the pelvic ultrasound with Dr. Tamela Oddi with OB/GYN.  She recommends having the patient follow up in the office in 2 weeks.   MDM  No diagnosis found. Patient who is G3P2, currently pregnant presents today with a chief complaint of vaginal bleeding.  Patient was seen in the ED on 03/08/13 and ultrasound at that time showed an empty gestational sac.  She reports that she has had some vaginal spotting since that time, however, today was having a large amount of vaginal bleeding with large clots.  Bleeding associated with pelvic cramping.  Repeat ultrasound today shows no evidence of intrauterine pregnancy.  Does show heterogeneous material distending the lower end of the endometrial canal and the endocervical  canal consistent with thrombus and possibly products of conception.  Patient is hemodynamically stable.  Pain controlled in the ED.  Results of the ultrasound discussed with Dr. Tamela Oddi with OB/GYN.  She reports that the patient can follow up in the office in 2 weeks.  Patient stable for discharge.  Return precautions given.    Santiago Glad, PA-C 03/29/13 1547

## 2013-03-30 LAB — URINE CULTURE

## 2013-03-30 LAB — GC/CHLAMYDIA PROBE AMP: GC Probe RNA: NEGATIVE

## 2013-04-01 NOTE — ED Provider Notes (Signed)
  Medical screening examination/treatment/procedure(s) were performed by non-physician practitioner and as supervising physician I was immediately available for consultation/collaboration. With the passage of material, there was concern for incomplete miscarriage.  Her case was discussed at length, multiple times, with her obstetric team.    Gerhard Munch, MD 04/01/13 [phone]

## 2013-05-05 NOTE — L&D Delivery Note (Signed)
Delivery Note At 11:20 PM a viable female was delivered via Vaginal, Spontaneous Delivery (Presentation: ; Occiput Anterior).  APGAR: 7, 9; weight pending.   Placenta status: Intact, Spontaneous.  Cord: 3 vessels with the following complications: None.  Cord pH: pending   Anesthesia: Epidural  Episiotomy: None Lacerations: None Suture Repair: N/A Est. Blood Loss (mL): 150  Mother doing well.  Baby completely pink with pulse O2 of 96% at 27min postpartum. Mom to postpartum.  Baby to Couplet care / Skin to Skin.  ADAMS,SHNIQUAL SHWON 02/23/2014, 11:46 PM

## 2013-05-05 NOTE — L&D Delivery Note (Signed)
Attestation of Attending Supervision of Advanced Practitioner (PA/CNM/NP): Evaluation and management procedures were performed by the Advanced Practitioner under my supervision and collaboration.  I have reviewed the Advanced Practitioner's note and chart, and I agree with the management and plan.  Alethea Terhaar, MD, FACOG Attending Obstetrician & Gynecologist Faculty Practice, [facility]   

## 2013-07-19 ENCOUNTER — Inpatient Hospital Stay (HOSPITAL_COMMUNITY): Payer: Medicaid Other

## 2013-07-19 ENCOUNTER — Ambulatory Visit (INDEPENDENT_AMBULATORY_CARE_PROVIDER_SITE_OTHER): Payer: Medicaid Other | Admitting: *Deleted

## 2013-07-19 ENCOUNTER — Inpatient Hospital Stay (HOSPITAL_COMMUNITY)
Admission: AD | Admit: 2013-07-19 | Discharge: 2013-07-19 | Disposition: A | Discharge status: Home / Self Care | Payer: Medicaid Other | Source: Ambulatory Visit | Attending: Family Medicine | Admitting: Family Medicine

## 2013-07-19 ENCOUNTER — Encounter: Payer: Self-pay | Admitting: *Deleted

## 2013-07-19 ENCOUNTER — Encounter (HOSPITAL_COMMUNITY): Payer: Self-pay | Admitting: *Deleted

## 2013-07-19 DIAGNOSIS — O4590 Premature separation of placenta, unspecified, unspecified trimester: Secondary | ICD-10-CM

## 2013-07-19 DIAGNOSIS — O468X9 Other antepartum hemorrhage, unspecified trimester: Secondary | ICD-10-CM

## 2013-07-19 DIAGNOSIS — O418X90 Other specified disorders of amniotic fluid and membranes, unspecified trimester, not applicable or unspecified: Secondary | ICD-10-CM

## 2013-07-19 DIAGNOSIS — N926 Irregular menstruation, unspecified: Secondary | ICD-10-CM

## 2013-07-19 DIAGNOSIS — O208 Other hemorrhage in early pregnancy: Secondary | ICD-10-CM | POA: Insufficient documentation

## 2013-07-19 DIAGNOSIS — O4690 Antepartum hemorrhage, unspecified, unspecified trimester: Secondary | ICD-10-CM

## 2013-07-19 DIAGNOSIS — O99330 Smoking (tobacco) complicating pregnancy, unspecified trimester: Secondary | ICD-10-CM | POA: Insufficient documentation

## 2013-07-19 HISTORY — DX: Unspecified infectious disease: B99.9

## 2013-07-19 LAB — CBC
HCT: 36.5 % (ref 36.0–46.0)
Hemoglobin: 12.8 g/dL (ref 12.0–15.0)
MCH: 31.6 pg (ref 26.0–34.0)
MCHC: 35.1 g/dL (ref 30.0–36.0)
MCV: 90.1 fL (ref 78.0–100.0)
PLATELETS: 267 10*3/uL (ref 150–400)
RBC: 4.05 MIL/uL (ref 3.87–5.11)
RDW: 12.4 % (ref 11.5–15.5)
WBC: 12 10*3/uL — ABNORMAL HIGH (ref 4.0–10.5)

## 2013-07-19 LAB — URINALYSIS, ROUTINE W REFLEX MICROSCOPIC
Glucose, UA: NEGATIVE mg/dL
KETONES UR: NEGATIVE mg/dL
Leukocytes, UA: NEGATIVE
NITRITE: NEGATIVE
Protein, ur: 30 mg/dL — AB
SPECIFIC GRAVITY, URINE: 1.02 (ref 1.005–1.030)
UROBILINOGEN UA: 1 mg/dL (ref 0.0–1.0)
pH: 8 (ref 5.0–8.0)

## 2013-07-19 LAB — WET PREP, GENITAL
TRICH WET PREP: NONE SEEN
YEAST WET PREP: NONE SEEN

## 2013-07-19 LAB — POCT PREGNANCY, URINE: Preg Test, Ur: POSITIVE — AB

## 2013-07-19 LAB — URINE MICROSCOPIC-ADD ON

## 2013-07-19 LAB — HCG, QUANTITATIVE, PREGNANCY: HCG, BETA CHAIN, QUANT, S: 9174 m[IU]/mL — AB (ref <5)
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
## 2013-07-19 NOTE — Discharge Instructions (Signed)
° °Pelvic Rest °Pelvic rest is sometimes recommended for women when:  °· The placenta is partially or completely covering the opening of the cervix (placenta previa). °· There is bleeding between the uterine wall and the amniotic sac in the first trimester (subchorionic hemorrhage). °· The cervix begins to open without labor starting (incompetent cervix, cervical insufficiency). °· The labor is too early (preterm labor). °HOME CARE INSTRUCTIONS °· Do not have sexual intercourse, stimulation, or an orgasm. °· Do not use tampons, douche, or put anything in the vagina. °· Do not lift anything over 10 pounds (4.5 kg). °· Avoid strenuous activity or straining your pelvic muscles. °SEEK MEDICAL CARE IF:  °· You have any vaginal bleeding during pregnancy. Treat this as a potential emergency. °· You have cramping pain felt low in the stomach (stronger than menstrual cramps). °· You notice vaginal discharge (watery, mucus, or bloody). °· You have a low, dull backache. °· There are regular contractions or uterine tightening. °SEEK IMMEDIATE MEDICAL CARE IF: °You have vaginal bleeding and have placenta previa.  °Document Released: 08/16/2010 Document Revised: 07/14/2011 Document Reviewed: 08/16/2010 °ExitCare® Patient Information ©2014 [employer]. ° °Vaginal Bleeding During Pregnancy, First Trimester °A small amount of bleeding (spotting) from the vagina is relatively common in early pregnancy. It usually stops on its own. Various things may cause bleeding or spotting in early pregnancy. Some bleeding may be related to the pregnancy, and some may not. In most cases, the bleeding is normal and is not a problem. However, bleeding can also be a sign of something serious. Be sure to tell your health care provider about any vaginal bleeding right away. °Some possible causes of vaginal bleeding during the first trimester include: °· Infection or inflammation of the cervix. °· Growths (polyps) on the cervix. °· Miscarriage or  threatened miscarriage. °· Pregnancy tissue has developed outside of the uterus and in a fallopian tube (tubal pregnancy). °· Tiny cysts have developed in the uterus instead of pregnancy tissue (molar pregnancy). °HOME CARE INSTRUCTIONS  °Watch your condition for any changes. The following actions may help to lessen any discomfort you are feeling: °· Follow your health care provider's instructions for limiting your activity. If your health care provider orders bed rest, you may need to stay in bed and only get up to use the bathroom. However, your health care provider may allow you to continue light activity. °· If needed, make plans for someone to help with your regular activities and responsibilities while you are on bed rest. °· Keep track of the number of pads you use each day, how often you change pads, and how soaked (saturated) they are. Write this down. °· Do not use tampons. Do not douche. °· Do not have sexual intercourse or orgasms until approved by your health care provider. °· If you pass any tissue from your vagina, save the tissue so you can show it to your health care provider. °· Only take over-the-counter or prescription medicines as directed by your health care provider. °· Do not take aspirin because it can make you bleed. °· Keep all follow-up appointments as directed by your health care provider. °SEEK MEDICAL CARE IF: °· You have any vaginal bleeding during any part of your pregnancy. °· You have cramps or labor pains. °SEEK IMMEDIATE MEDICAL CARE IF:  °· You have severe cramps in your back or belly (abdomen). °· You have a fever, not controlled by medicine. °· You pass large clots or tissue from your vagina. °· Your bleeding   increases. °· You feel lightheaded or weak, or you have fainting episodes. °· You have chills. °· You are leaking fluid or have a gush of fluid from your vagina. °· You pass out while having a bowel movement. °MAKE SURE YOU: °· Understand these instructions. °· Will watch  your condition. °· Will get help right away if you are not doing well or get worse. °Document Released: 01/29/2005 Document Revised: 02/09/2013 Document Reviewed: 12/27/2012 °ExitCare® Patient Information ©2014 [employer]. ° °

## 2013-07-19 NOTE — Progress Notes (Signed)
Pregnancy test: POSITIVE, labs and dating ultrasound ordered/obtained today. Letter of pregnancy verification given.

## 2013-07-19 NOTE — MAU Provider Note (Signed)
Attestation of Attending Supervision of Advanced Practitioner (PA/CNM/NP): Evaluation and management procedures were performed by the Advanced Practitioner under my supervision and collaboration.  I have reviewed the Advanced Practitioner's note and chart, and I agree with the management and plan.  Donnamae Jude, MD [facility] Attending 07/19/2013 8:55 PM
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
## 2013-07-20 LAB — OBSTETRIC PANEL
Antibody Screen: NEGATIVE
Basophils Absolute: 0 10*3/uL (ref 0.0–0.1)
Basophils Relative: 0 % (ref 0–1)
EOS ABS: 0.1 10*3/uL (ref 0.0–0.7)
Eosinophils Relative: 1 % (ref 0–5)
HCT: 38.6 % (ref 36.0–46.0)
HEP B S AG: NEGATIVE
Hemoglobin: 13.1 g/dL (ref 12.0–15.0)
Lymphocytes Relative: 25 % (ref 12–46)
Lymphs Abs: 2.2 10*3/uL (ref 0.7–4.0)
MCH: 30.2 pg (ref 26.0–34.0)
MCHC: 33.9 g/dL (ref 30.0–36.0)
MCV: 88.9 fL (ref 78.0–100.0)
Monocytes Absolute: 0.6 10*3/uL (ref 0.1–1.0)
Monocytes Relative: 7 % (ref 3–12)
NEUTROS PCT: 67 % (ref 43–77)
Neutro Abs: 5.8 10*3/uL (ref 1.7–7.7)
PLATELETS: 289 10*3/uL (ref 150–400)
RBC: 4.34 MIL/uL (ref 3.87–5.11)
RDW: 13 % (ref 11.5–15.5)
Rh Type: POSITIVE
Rubella: 0.46 Index (ref <0.90)
WBC: 8.7 10*3/uL (ref 4.0–10.5)

## 2013-07-20 LAB — GC/CHLAMYDIA PROBE AMP
CT PROBE, AMP APTIMA: NEGATIVE
GC Probe RNA: NEGATIVE

## 2013-07-20 LAB — HIV ANTIBODY (ROUTINE TESTING W REFLEX): HIV: NONREACTIVE

## 2013-07-21 ENCOUNTER — Encounter: Payer: Self-pay | Admitting: Family

## 2013-07-21 DIAGNOSIS — O9989 Other specified diseases and conditions complicating pregnancy, childbirth and the puerperium: Secondary | ICD-10-CM

## 2013-07-21 DIAGNOSIS — Z283 Underimmunization status: Secondary | ICD-10-CM | POA: Insufficient documentation

## 2013-07-21 DIAGNOSIS — Z2839 Other underimmunization status: Secondary | ICD-10-CM | POA: Insufficient documentation

## 2013-07-21 LAB — URINE CULTURE: Colony Count: >=100000

## 2013-07-21 LAB — HEMOGLOBINOPATHY EVALUATION
HEMOGLOBIN OTHER: 0 %
HGB A2 QUANT: 2.9 % (ref 2.2–3.2)
HGB A: 97.1 % (ref 96.8–97.8)
HGB S QUANTITAION: 0 %
Hgb F Quant: 0 % (ref 0.0–2.0)

## 2013-07-22 ENCOUNTER — Other Ambulatory Visit: Payer: Self-pay | Admitting: Medical

## 2013-07-22 DIAGNOSIS — O2340 Unspecified infection of urinary tract in pregnancy, unspecified trimester: Principal | ICD-10-CM

## 2013-07-22 DIAGNOSIS — B951 Streptococcus, group B, as the cause of diseases classified elsewhere: Secondary | ICD-10-CM

## 2013-07-22 MED ORDER — AMOXICILLIN 500 MG PO CAPS: 500.0000 mg | ORAL_CAPSULE | Freq: Three times a day (TID) | ORAL | Status: DC

## 2013-07-22 NOTE — Progress Notes (Signed)
Called patient and informed her of UTI dx and Rx at her pharmacy. Confirmed correct pharmacy. Advised increased PO hydration as tolerated. Return to MAU if symptoms worsen or fever develops.   Farris Has, PA-C 07/22/2013 12:14 PM

## 2013-07-29 ENCOUNTER — Ambulatory Visit (HOSPITAL_COMMUNITY)
Admission: RE | Admit: 2013-07-29 | Discharge: 2013-07-29 | Disposition: A | Discharge status: Home / Self Care | Payer: Medicaid Other | Source: Ambulatory Visit | Attending: Family | Admitting: Family

## 2013-07-29 DIAGNOSIS — Z3689 Encounter for other specified antenatal screening: Secondary | ICD-10-CM | POA: Insufficient documentation

## 2013-07-29 DIAGNOSIS — O3680X0 Pregnancy with inconclusive fetal viability, not applicable or unspecified: Secondary | ICD-10-CM | POA: Insufficient documentation

## 2013-07-29 DIAGNOSIS — N926 Irregular menstruation, unspecified: Secondary | ICD-10-CM

## 2013-07-29 DIAGNOSIS — O208 Other hemorrhage in early pregnancy: Secondary | ICD-10-CM | POA: Insufficient documentation

## 2013-07-31 ENCOUNTER — Encounter (HOSPITAL_COMMUNITY): Payer: Self-pay | Admitting: Family

## 2013-08-03 ENCOUNTER — Inpatient Hospital Stay (HOSPITAL_COMMUNITY)
Admission: AD | Admit: 2013-08-03 | Discharge: 2013-08-03 | Disposition: A | Discharge status: Home / Self Care | Payer: Medicaid Other | Source: Ambulatory Visit | Attending: Obstetrics & Gynecology | Admitting: Obstetrics & Gynecology

## 2013-08-03 ENCOUNTER — Telehealth: Payer: Self-pay | Admitting: *Deleted

## 2013-08-03 ENCOUNTER — Encounter (HOSPITAL_COMMUNITY): Payer: Self-pay | Admitting: *Deleted

## 2013-08-03 DIAGNOSIS — R197 Diarrhea, unspecified: Secondary | ICD-10-CM | POA: Insufficient documentation

## 2013-08-03 DIAGNOSIS — R112 Nausea with vomiting, unspecified: Secondary | ICD-10-CM

## 2013-08-03 DIAGNOSIS — O210 Mild hyperemesis gravidarum: Secondary | ICD-10-CM | POA: Insufficient documentation

## 2013-08-03 DIAGNOSIS — Z8249 Family history of ischemic heart disease and other diseases of the circulatory system: Secondary | ICD-10-CM | POA: Insufficient documentation

## 2013-08-03 DIAGNOSIS — O99330 Smoking (tobacco) complicating pregnancy, unspecified trimester: Secondary | ICD-10-CM | POA: Insufficient documentation

## 2013-08-03 LAB — URINALYSIS, ROUTINE W REFLEX MICROSCOPIC
Glucose, UA: NEGATIVE mg/dL
Hgb urine dipstick: NEGATIVE
KETONES UR: 40 mg/dL — AB
Leukocytes, UA: NEGATIVE
NITRITE: NEGATIVE
PH: 7 (ref 5.0–8.0)
Protein, ur: 30 mg/dL — AB
SPECIFIC GRAVITY, URINE: 1.015 (ref 1.005–1.030)
UROBILINOGEN UA: 0.2 mg/dL (ref 0.0–1.0)

## 2013-08-03 LAB — URINE MICROSCOPIC-ADD ON

## 2013-08-03 MED ORDER — LACTATED RINGERS IV SOLN
INTRAVENOUS | Status: DC
  Administered 2013-08-03: 12:00:00 via INTRAVENOUS
  Filled 2013-08-03 (×19): qty 1000

## 2013-08-03 MED ORDER — PROMETHAZINE HCL 25 MG PO TABS: 25.0000 mg | ORAL_TABLET | Freq: Four times a day (QID) | ORAL | Status: DC | PRN

## 2013-08-03 MED ORDER — FAMOTIDINE IN NACL 20-0.9 MG/50ML-% IV SOLN
20.0000 mg | Freq: Once | INTRAVENOUS | Status: AC
  Administered 2013-08-03: 20 mg via INTRAVENOUS
  Filled 2013-08-03: qty 50

## 2013-08-03 MED ORDER — PROMETHAZINE HCL 25 MG/ML IJ SOLN
25.0000 mg | Freq: Once | INTRAMUSCULAR | Status: AC
  Administered 2013-08-03: 25 mg via INTRAVENOUS
  Filled 2013-08-03: qty 1

## 2013-08-03 NOTE — MAU Note (Signed)
Patient presents to MAU with dizziness and diarrhea since Sunday. Reports a decrease in appetite. Concerned about dehydration.

## 2013-08-03 NOTE — MAU Provider Note (Signed)
History     CSN: [id number]  Arrival date and time: 08/03/13 1004   First Provider Initiated Contact with Patient 08/03/13 1115      Chief Complaint  Patient presents with  . Dizziness  . Diarrhea  . decreased appetite    HPI Comments: Paula Gilbert 23 y.o. [id number] presents to MAU with dizziness, nausea, vomiting and degydration ongoing for 3 days. She also has a lack of appetite. Her care will be at [facility]. She has had an ultrasound with this pregnancy and has IUP.  Dizziness Associated symptoms include nausea and vomiting.  Diarrhea  Associated symptoms include vomiting.      Past Medical History  Diagnosis Date  . Infection     trich, chlamydia    Past Surgical History  Procedure Laterality Date  . I&d extremity  02/07/2012    Procedure: IRRIGATION AND DEBRIDEMENT EXTREMITY;  Surgeon: Meredith Pel, MD;  Location: [facility];  Service: Orthopedics;  Laterality: Bilateral;  . Orif tibia fracture  02/07/2012    Procedure: OPEN REDUCTION INTERNAL FIXATION (ORIF) TIBIA FRACTURE;  Surgeon: Meredith Pel, MD;  Location: [facility];  Service: Orthopedics;  Laterality: Right;    Family History  Problem Relation Age of Onset  . Hypertension Paternal Grandfather   . Hypertension Maternal Grandmother   . Diabetes Maternal Grandmother   . Hypertension Mother   . Hypertension Maternal Grandfather   . Diabetes Paternal Grandmother   . Hearing loss Neg Hx     History  Substance Use Topics  . Smoking status: Current Every Day Smoker -- 0.50 packs/day for 5 years    Types: Cigarettes  . Smokeless tobacco: Never Used  . Alcohol Use: No    Allergies: No Known Allergies  No prescriptions prior to admission    Review of Systems  Gastrointestinal: Positive for nausea, vomiting and diarrhea.  Neurological: Positive for dizziness.   Physical Exam   Blood pressure 111/72, pulse 105, temperature 97.9 F (36.6 C), temperature source Oral, resp. rate 16, height  5\' 7"  (1.702 m), weight 118 lb 12.8 oz (53.887 kg), last menstrual period 05/23/2013, SpO2 100.00%.  Physical Exam  Constitutional: She is oriented to person, place, and time. She appears well-developed and well-nourished. No distress.  multiple tattoos  HENT:  Head: Normocephalic and atraumatic.  Eyes: Pupils are equal, round, and reactive to light.  Cardiovascular: Normal rate, regular rhythm and normal heart sounds.   Respiratory: Effort normal and breath sounds normal. No respiratory distress.  GI: Soft. Bowel sounds are normal. She exhibits no distension. There is no tenderness. There is no rebound.  Genitourinary:  Not examined  Musculoskeletal: Normal range of motion.  Neurological: She is alert and oriented to person, place, and time.  Skin: Skin is dry.  Psychiatric: She has a normal mood and affect. Her behavior is normal. Judgment and thought content normal.   Results for orders placed during the hospital encounter of 08/03/13 (from the past 24 hour(s))  URINALYSIS, ROUTINE W REFLEX MICROSCOPIC     Status: Abnormal   Collection Time    08/03/13 10:25 AM      Result Value Ref Range   Color, Urine YELLOW  YELLOW   APPearance CLEAR  CLEAR   Specific Gravity, Urine 1.015  1.005 - 1.030   pH 7.0  5.0 - 8.0   Glucose, UA NEGATIVE  NEGATIVE mg/dL   Hgb urine dipstick NEGATIVE  NEGATIVE   Bilirubin Urine SMALL (*) NEGATIVE   Ketones, ur 40 (*)  NEGATIVE mg/dL   Protein, ur 30 (*) NEGATIVE mg/dL   Urobilinogen, UA 0.2  0.0 - 1.0 mg/dL   Nitrite NEGATIVE  NEGATIVE   Leukocytes, UA NEGATIVE  NEGATIVE  URINE MICROSCOPIC-ADD ON     Status: None   Collection Time    08/03/13 10:25 AM      Result Value Ref Range   Squamous Epithelial / LPF RARE  RARE   WBC, UA 0-2  <3 WBC/hpf   Bacteria, UA RARE  RARE   Urine-Other MUCOUS PRESENT      MAU Course  Procedures  MDM  Phenergan 25 mg IVP LR 1 liter bolus Pepcid 20mg  IVPB  Assessment and Plan   A: Nausea and vomiting in  early pregnancy  P: Phenergan 25 mg po q6 hrs  Increase fluids/ rest Establish prenatal care Return prn   Georgia Duff 08/03/2013, 11:26 AM

## 2013-08-03 NOTE — Telephone Encounter (Signed)
Patient called nurse line requesting call back from nurse.  Needs information on dehydration being [redacted] wks pregnant.  Called patient and patient is currently in MAU.

## 2013-08-03 NOTE — MAU Provider Note (Signed)
Attestation of Attending Supervision of Advanced Practitioner (CNM/NP): Evaluation and management procedures were performed by the Advanced Practitioner under my supervision and collaboration.  I have reviewed the Advanced Practitioner's note and chart, and I agree with the management and plan.  HARRAWAY-SMITH, Kemani Heidel 2:19 PM

## 2013-08-03 NOTE — Discharge Instructions (Signed)

## 2013-08-17 ENCOUNTER — Other Ambulatory Visit (HOSPITAL_COMMUNITY)
Admission: RE | Admit: 2013-08-17 | Discharge: 2013-08-17 | Disposition: A | Discharge status: Home / Self Care | Payer: Medicaid Other | Source: Ambulatory Visit | Attending: Advanced Practice Midwife | Admitting: Advanced Practice Midwife

## 2013-08-17 ENCOUNTER — Encounter: Payer: Self-pay | Admitting: Advanced Practice Midwife

## 2013-08-17 ENCOUNTER — Ambulatory Visit (INDEPENDENT_AMBULATORY_CARE_PROVIDER_SITE_OTHER): Payer: Medicaid Other | Admitting: Advanced Practice Midwife

## 2013-08-17 ENCOUNTER — Other Ambulatory Visit: Payer: Self-pay | Admitting: Advanced Practice Midwife

## 2013-08-17 VITALS — BP 116/77 | Temp 98.3°F | Wt 121.6 lb

## 2013-08-17 DIAGNOSIS — R8271 Bacteriuria: Secondary | ICD-10-CM | POA: Insufficient documentation

## 2013-08-17 DIAGNOSIS — O209 Hemorrhage in early pregnancy, unspecified: Secondary | ICD-10-CM

## 2013-08-17 DIAGNOSIS — Z01419 Encounter for gynecological examination (general) (routine) without abnormal findings: Secondary | ICD-10-CM | POA: Insufficient documentation

## 2013-08-17 DIAGNOSIS — N390 Urinary tract infection, site not specified: Secondary | ICD-10-CM

## 2013-08-17 DIAGNOSIS — Z23 Encounter for immunization: Secondary | ICD-10-CM

## 2013-08-17 DIAGNOSIS — O09299 Supervision of pregnancy with other poor reproductive or obstetric history, unspecified trimester: Secondary | ICD-10-CM

## 2013-08-17 DIAGNOSIS — Z3682 Encounter for antenatal screening for nuchal translucency: Secondary | ICD-10-CM

## 2013-08-17 DIAGNOSIS — B951 Streptococcus, group B, as the cause of diseases classified elsewhere: Secondary | ICD-10-CM

## 2013-08-17 DIAGNOSIS — Z113 Encounter for screening for infections with a predominantly sexual mode of transmission: Secondary | ICD-10-CM | POA: Insufficient documentation

## 2013-08-17 HISTORY — DX: Bacteriuria: R82.71

## 2013-08-17 LAB — POCT URINALYSIS DIP (DEVICE)
BILIRUBIN URINE: NEGATIVE
Glucose, UA: NEGATIVE mg/dL
HGB URINE DIPSTICK: NEGATIVE
KETONES UR: NEGATIVE mg/dL
Leukocytes, UA: NEGATIVE
Nitrite: NEGATIVE
Protein, ur: NEGATIVE mg/dL
Specific Gravity, Urine: 1.015 (ref 1.005–1.030)
Urobilinogen, UA: 0.2 mg/dL (ref 0.0–1.0)
pH: 7.5 (ref 5.0–8.0)

## 2013-08-17 MED ORDER — PRENATAL VITAMINS PLUS 27-1 MG PO TABS: 1.0000 | ORAL_TABLET | Freq: Every day | ORAL | Status: DC

## 2013-08-17 NOTE — Progress Notes (Signed)
Pulse- 83  Pain-lower abd Weight gain 25-35lbs Declined flu vaccine

## 2013-08-17 NOTE — Patient Instructions (Signed)
Pregnancy - First Trimester  During sexual intercourse, millions of sperm go into the vagina. Only 1 sperm will penetrate and fertilize the female egg while it is in the Fallopian tube. One week later, the fertilized egg implants into the wall of the uterus. An embryo begins to develop into a baby. At 6 to 8 weeks, the eyes and face are formed and the heartbeat can be seen on ultrasound. At the end of 12 weeks (first trimester), all the baby's organs are formed. Now that you are pregnant, you will want to do everything you can to have a healthy baby. Two of the most important things are to get good prenatal care and follow your caregiver's instructions. Prenatal care is all the medical care you receive before the baby's birth. It is given to prevent, find, and treat problems during the pregnancy and childbirth.  PRENATAL EXAMS  · During prenatal visits, your weight, blood pressure, and urine are checked. This is done to make sure you are healthy and progressing normally during the pregnancy.  · A pregnant woman should gain 25 to 35 pounds during the pregnancy. However, if you are overweight or underweight, your caregiver will advise you regarding your weight.  · Your caregiver will ask and answer questions for you.  · Blood work, cervical cultures, other necessary tests, and a Pap test are done during your prenatal exams. These tests are done to check on your health and the probable health of your baby. Tests are strongly recommended and done for HIV with your permission. This is the virus that causes AIDS. These tests are done because medicines can be given to help prevent your baby from being born with this infection should you have been infected without knowing it. Blood work is also used to find out your blood type, previous infections, and follow your blood levels (hemoglobin).  · Low hemoglobin (anemia) is common during pregnancy. Iron and vitamins are given to help prevent this. Later in the pregnancy, blood  tests for diabetes will be done along with any other tests if any problems develop.  · You may need other tests to make sure you and the baby are doing well.  CHANGES DURING THE FIRST TRIMESTER   Your body goes through many changes during pregnancy. They vary from person to person. Talk to your caregiver about changes you notice and are concerned about. Changes can include:  · Your menstrual period stops.  · The egg and sperm carry the genes that determine what you look like. Genes from you and your partner are forming a baby. The female genes determine whether the baby is a boy or a girl.  · Your body increases in girth and you may feel bloated.  · Feeling sick to your stomach (nauseous) and throwing up (vomiting). If the vomiting is uncontrollable, call your caregiver.  · Your breasts will begin to enlarge and become tender.  · Your nipples may stick out more and become darker.  · The need to urinate more. Painful urination may mean you have a bladder infection.  · Tiring easily.  · Loss of appetite.  · Cravings for certain kinds of food.  · At first, you may gain or lose a couple of pounds.  · You may have changes in your emotions from day to day (excited to be pregnant or concerned something may go wrong with the pregnancy and baby).  · You may have more vivid and strange dreams.  HOME CARE INSTRUCTIONS   ·   It is very important to avoid all smoking, alcohol and non-prescribed drugs during your pregnancy. These affect the formation and growth of the baby. Avoid chemicals while pregnant to ensure the delivery of a healthy infant.  · Start your prenatal visits by the 12th week of pregnancy. They are usually scheduled monthly at first, then more often in the last 2 months before delivery. Keep your caregiver's appointments. Follow your caregiver's instructions regarding medicine use, blood and lab tests, exercise, and diet.  · During pregnancy, you are providing food for you and your baby. Eat regular, well-balanced  meals. Choose foods such as meat, fish, milk and other low fat dairy products, vegetables, fruits, and whole-grain breads and cereals. Your caregiver will tell you of the ideal weight gain.  · You can help morning sickness by keeping soda crackers at the bedside. Eat a couple before arising in the morning. You may want to use the crackers without salt on them.  · Eating 4 to 5 small meals rather than 3 large meals a day also may help the nausea and vomiting.  · Drinking liquids between meals instead of during meals also seems to help nausea and vomiting.  · A physical sexual relationship may be continued throughout pregnancy if there are no other problems. Problems may be early (premature) leaking of amniotic fluid from the membranes, vaginal bleeding, or belly (abdominal) pain.  · Exercise regularly if there are no restrictions. Check with your caregiver or physical therapist if you are unsure of the safety of some of your exercises. Greater weight gain will occur in the last 2 trimesters of pregnancy. Exercising will help:  · Control your weight.  · Keep you in shape.  · Prepare you for labor and delivery.  · Help you lose your pregnancy weight after you deliver your baby.  · Wear a good support or jogging bra for breast tenderness during pregnancy. This may help if worn during sleep too.  · Ask when prenatal classes are available. Begin classes when they are offered.  · Do not use hot tubs, steam rooms, or saunas.  · Wear your seat belt when driving. This protects you and your baby if you are in an accident.  · Avoid raw meat, uncooked cheese, cat litter boxes, and soil used by cats throughout the pregnancy. These carry germs that can cause birth defects in the baby.  · The first trimester is a good time to visit your dentist for your dental health. Getting your teeth cleaned is okay. Use a softer toothbrush and brush gently during pregnancy.  · Ask for help if you have financial, counseling, or nutritional needs  during pregnancy. Your caregiver will be able to offer counseling for these needs as well as refer you for other special needs.  · Do not take any medicines or herbs unless told by your caregiver.  · Inform your caregiver if there is any mental or physical domestic violence.  · Make a list of emergency phone numbers of family, friends, hospital, and police and fire departments.  · Write down your questions. Take them to your prenatal visit.  · Do not douche.  · Do not cross your legs.  · If you have to stand for long periods of time, rotate you feet or take small steps in a circle.  · You may have more vaginal secretions that may require a sanitary pad. Do not use tampons or scented sanitary pads.  MEDICINES AND DRUG USE IN PREGNANCY  ·   Take prenatal vitamins as directed. The vitamin should contain 1 milligram of folic acid. Keep all vitamins out of reach of children. Only a couple vitamins or tablets containing iron may be fatal to a baby or young child when ingested.  · Avoid use of all medicines, including herbs, over-the-counter medicines, not prescribed or suggested by your caregiver. Only take over-the-counter or prescription medicines for pain, discomfort, or fever as directed by your caregiver. Do not use aspirin, ibuprofen, or naproxen unless directed by your caregiver.  · Let your caregiver also know about herbs you may be using.  · Alcohol is related to a number of birth defects. This includes fetal alcohol syndrome. All alcohol, in any form, should be avoided completely. Smoking will cause low birth rate and premature babies.  · Street or illegal drugs are very harmful to the baby. They are absolutely forbidden. A baby born to an addicted mother will be addicted at birth. The baby will go through the same withdrawal an adult does.  · Let your caregiver know about any medicines that you have to take and for what reason you take them.  SEEK MEDICAL CARE IF:   You have any concerns or worries during your  pregnancy. It is better to call with your questions if you feel they cannot wait, rather than worry about them.  SEEK IMMEDIATE MEDICAL CARE IF:   · An unexplained oral temperature above 102° F (38.9° C) develops, or as your caregiver suggests.  · You have leaking of fluid from the vagina (birth canal). If leaking membranes are suspected, take your temperature and inform your caregiver of this when you call.  · There is vaginal spotting or bleeding. Notify your caregiver of the amount and how many pads are used.  · You develop a bad smelling vaginal discharge with a change in the color.  · You continue to feel sick to your stomach (nauseated) and have no relief from remedies suggested. You vomit blood or coffee ground-like materials.  · You lose more than 2 pounds of weight in 1 week.  · You gain more than 2 pounds of weight in 1 week and you notice swelling of your face, hands, feet, or legs.  · You gain 5 pounds or more in 1 week (even if you do not have swelling of your hands, face, legs, or feet).  · You get exposed to German measles and have never had them.  · You are exposed to fifth disease or chickenpox.  · You develop belly (abdominal) pain. Round ligament discomfort is a common non-cancerous (benign) cause of abdominal pain in pregnancy. Your caregiver still must evaluate this.  · You develop headache, fever, diarrhea, pain with urination, or shortness of breath.  · You fall or are in a car accident or have any kind of trauma.  · There is mental or physical violence in your home.  Document Released: 04/15/2001 Document Revised: 01/14/2012 Document Reviewed: 10/17/2008  [employer]® Patient Information ©2014 [employer].

## 2013-08-17 NOTE — Progress Notes (Signed)
New OB. Routines reviewed. See smartset  Subjective:    Paula Gilbert is a [id number] [redacted]w[redacted]d being seen today for her first obstetrical visit.  Her obstetrical history is significant for group B strep colonizer. Patient does intend to breast feed. Pregnancy history fully reviewed.  Patient reports no complaints.  Filed Vitals:   08/17/13 1006  BP: 116/77  Temp: 98.3 F (36.8 C)  Weight: 55.171 kg (121 lb 10.1 oz)    HISTORY: OB History  Gravida Para Term Preterm AB SAB TAB Ectopic Multiple Living  4 2 2  0 1 1 0 0 0 2    # Outcome Date GA Lbr Len/2nd Weight Sex Delivery Anes PTL Lv  4 CUR           3 SAB 03/2013             Comments: "lots of bleeding"  2 TRM 05/13/09 [redacted]w[redacted]d  2.92 kg (6 lb 7 oz) F SVD None N Y  1 TRM 06/16/08 [redacted]w[redacted]d  3.941 kg (8 lb 11 oz) F SVD EPI N Y     Past Medical History  Diagnosis Date  . Infection     trich, chlamydia   Past Surgical History  Procedure Laterality Date  . I&d extremity  02/07/2012    Procedure: IRRIGATION AND DEBRIDEMENT EXTREMITY;  Surgeon: Meredith Pel, MD;  Location: [facility];  Service: Orthopedics;  Laterality: Bilateral;  . Orif tibia fracture  02/07/2012    Procedure: OPEN REDUCTION INTERNAL FIXATION (ORIF) TIBIA FRACTURE;  Surgeon: Meredith Pel, MD;  Location: [facility];  Service: Orthopedics;  Laterality: Right;   Family History  Problem Relation Age of Onset  . Hypertension Paternal Grandfather   . Hypertension Maternal Grandmother   . Diabetes Maternal Grandmother   . Hypertension Mother   . Hypertension Maternal Grandfather   . Diabetes Paternal Grandmother   . Hearing loss Neg Hx      Exam    Uterus:  Fundal Height: 10 cm  Pelvic Exam:    Perineum: No Hemorrhoids, Normal Perineum   Vulva: Bartholin's, Urethra, Skene's normal   Vagina:  normal mucosa, normal discharge   pH:    Cervix: no cervical motion tenderness and no lesions   Adnexa: normal adnexa and no mass, fullness, tenderness   Bony Pelvis:  gynecoid  System: Breast:  normal appearance, no masses or tenderness   Skin: normal coloration and turgor, no rashes    Neurologic: oriented, normal mood   Extremities: normal strength, tone, and muscle mass   HEENT neck supple with midline trachea   Mouth/Teeth mucous membranes moist, pharynx normal without lesions   Neck supple and no masses   Cardiovascular: regular rate and rhythm, no murmurs or gallops   Respiratory:  appears well, vitals normal, no respiratory distress, acyanotic, normal RR, ear and throat exam is normal, neck free of mass or lymphadenopathy, chest clear, no wheezing, crepitations, rhonchi, normal symmetric air entry   Abdomen: soft, non-tender; bowel sounds normal; no masses,  no organomegaly   Urinary: urethral meatus normal      Assessment:    Pregnancy: [id number] Patient Active Problem List   Diagnosis Date Noted  . H/O spontaneous abortion, currently pregnant 08/17/2013  . Group B streptococcal bacteriuria 08/17/2013  . Rubella nonimmune status, delivered, current hospitalization 07/21/2013  . Complete spontaneous abortion without mention of complication 18/56/3149  . Encounter for Nexplanon removal 08/12/2012    SIUP at [redacted]w[redacted]d     Plan:  Initial labs drawn. Prenatal vitamins. Problem list reviewed and updated. Genetic Screening discussed Integrated Screen: ordered.  Ultrasound discussed; fetal survey: ordered.  Follow up in 4 weeks. 50% of 30 min visit spent on counseling and coordination of care.     Seabron Spates 08/17/2013

## 2013-08-19 LAB — CULTURE, OB URINE: Colony Count: 75000

## 2013-08-26 ENCOUNTER — Other Ambulatory Visit: Payer: Self-pay | Admitting: Advanced Practice Midwife

## 2013-08-26 MED ORDER — PENICILLIN V POTASSIUM 500 MG PO TABS: 500.0000 mg | ORAL_TABLET | Freq: Four times a day (QID) | ORAL | Status: DC

## 2013-08-26 NOTE — Progress Notes (Signed)
GBS in urine again.  Will retreat.

## 2013-08-29 ENCOUNTER — Telehealth: Payer: Self-pay

## 2013-08-29 MED ORDER — PENICILLIN V POTASSIUM 500 MG PO TABS: 500.0000 mg | ORAL_TABLET | Freq: Four times a day (QID) | ORAL | Status: DC

## 2013-08-29 NOTE — Telephone Encounter (Signed)
Message copied by Geanie Logan on Mon Aug 29, 2013  8:12 AM ------      Message from: Seabron Spates      Created: Fri Aug 26, 2013  4:43 PM      Regarding: UTI tx       Needs retreatment for UTI with GBS            Would not eprscribe            I put in ordr for PCN PenVK 500mg  qid x 7 d            Lelan Pons ------

## 2013-08-29 NOTE — Telephone Encounter (Signed)
Pt. Informed of UTI and prescription that was sent to pharmacy. Pt. Verbalized understanding. No questions or concerns.

## 2013-09-06 ENCOUNTER — Other Ambulatory Visit: Payer: Self-pay

## 2013-09-06 ENCOUNTER — Encounter (HOSPITAL_COMMUNITY): Payer: Self-pay

## 2013-09-06 ENCOUNTER — Ambulatory Visit (HOSPITAL_COMMUNITY)
Admission: RE | Admit: 2013-09-06 | Discharge: 2013-09-06 | Disposition: A | Discharge status: Home / Self Care | Payer: Medicaid Other | Source: Ambulatory Visit | Attending: Advanced Practice Midwife | Admitting: Advanced Practice Midwife

## 2013-09-06 DIAGNOSIS — Z36 Encounter for antenatal screening of mother: Secondary | ICD-10-CM | POA: Insufficient documentation

## 2013-09-06 DIAGNOSIS — Z3682 Encounter for antenatal screening for nuchal translucency: Secondary | ICD-10-CM

## 2013-09-07 ENCOUNTER — Encounter (HOSPITAL_COMMUNITY): Payer: Self-pay | Admitting: Advanced Practice Midwife

## 2013-09-14 ENCOUNTER — Ambulatory Visit (INDEPENDENT_AMBULATORY_CARE_PROVIDER_SITE_OTHER): Payer: Medicaid Other | Admitting: Family

## 2013-09-14 VITALS — BP 102/68 | HR 80 | Temp 97.1°F | Wt 126.4 lb

## 2013-09-14 DIAGNOSIS — O09299 Supervision of pregnancy with other poor reproductive or obstetric history, unspecified trimester: Secondary | ICD-10-CM

## 2013-09-14 LAB — POCT URINALYSIS DIP (DEVICE)
BILIRUBIN URINE: NEGATIVE
GLUCOSE, UA: NEGATIVE mg/dL
Ketones, ur: NEGATIVE mg/dL
Leukocytes, UA: NEGATIVE
Nitrite: NEGATIVE
PH: 6 (ref 5.0–8.0)
Protein, ur: NEGATIVE mg/dL
SPECIFIC GRAVITY, URINE: 1.02 (ref 1.005–1.030)
Urobilinogen, UA: 0.2 mg/dL (ref 0.0–1.0)

## 2013-09-14 NOTE — Progress Notes (Signed)
No questions or concerns today.  Reviewed OB labs.  Schedule ultrasound in 4 weeks.

## 2013-09-14 NOTE — Progress Notes (Signed)
Pt. Has not picked up any RXs including for UTI r/t finances; pt. States she will go today to pick them up.  C/o of occasional sharp shooting pain upper abdomen.

## 2013-10-07 ENCOUNTER — Ambulatory Visit (HOSPITAL_COMMUNITY)
Admission: RE | Admit: 2013-10-07 | Discharge: 2013-10-07 | Disposition: A | Discharge status: Home / Self Care | Payer: Medicaid Other | Source: Ambulatory Visit | Attending: Family | Admitting: Family

## 2013-10-07 ENCOUNTER — Other Ambulatory Visit: Payer: Self-pay | Admitting: Family

## 2013-10-07 DIAGNOSIS — O09299 Supervision of pregnancy with other poor reproductive or obstetric history, unspecified trimester: Secondary | ICD-10-CM

## 2013-10-07 DIAGNOSIS — Z3689 Encounter for other specified antenatal screening: Secondary | ICD-10-CM | POA: Insufficient documentation

## 2013-10-11 ENCOUNTER — Encounter: Payer: Self-pay | Admitting: Family

## 2013-10-12 ENCOUNTER — Ambulatory Visit: Payer: Medicaid Other | Admitting: Advanced Practice Midwife

## 2013-10-12 VITALS — BP 116/79 | HR 80 | Wt 130.5 lb

## 2013-10-12 DIAGNOSIS — O09299 Supervision of pregnancy with other poor reproductive or obstetric history, unspecified trimester: Secondary | ICD-10-CM

## 2013-10-12 DIAGNOSIS — N949 Unspecified condition associated with female genital organs and menstrual cycle: Secondary | ICD-10-CM

## 2013-10-12 NOTE — Progress Notes (Signed)
US done last week. Will schedule repeat one at next visit for 6 wks to complete anatomy. Has some ligament pain with intercourse. Reassured will probably resolve

## 2013-10-12 NOTE — Progress Notes (Signed)
Patient reports pelvic pain after intercourse.

## 2013-10-12 NOTE — Patient Instructions (Signed)
Second Trimester of Pregnancy The second trimester is from week 13 through week 28, months 4 through 6. The second trimester is often a time when you feel your best. Your body has also adjusted to being pregnant, and you begin to feel better physically. Usually, morning sickness has lessened or quit completely, you may have more energy, and you may have an increase in appetite. The second trimester is also a time when the fetus is growing rapidly. At the end of the sixth month, the fetus is about 9 inches long and weighs about 1 pounds. You will likely begin to feel the baby move (quickening) between 18 and 20 weeks of the pregnancy. BODY CHANGES Your body goes through many changes during pregnancy. The changes vary from woman to woman.   Your weight will continue to increase. You will notice your lower abdomen bulging out.  You may begin to get stretch marks on your hips, abdomen, and breasts.  You may develop headaches that can be relieved by medicines approved by your caregiver.  You may urinate more often because the fetus is pressing on your bladder.  You may develop or continue to have heartburn as a result of your pregnancy.  You may develop constipation because certain hormones are causing the muscles that push waste through your intestines to slow down.  You may develop hemorrhoids or swollen, bulging veins (varicose veins).  You may have back pain because of the weight gain and pregnancy hormones relaxing your joints between the bones in your pelvis and as a result of a shift in weight and the muscles that support your balance.  Your breasts will continue to grow and be tender.  Your gums may bleed and may be sensitive to brushing and flossing.  Dark spots or blotches (chloasma, mask of pregnancy) may develop on your face. This will likely fade after the baby is born.  A dark line from your belly button to the pubic area (linea nigra) may appear. This will likely fade after the  baby is born. WHAT TO EXPECT AT YOUR PRENATAL VISITS During a routine prenatal visit:  You will be weighed to make sure you and the fetus are growing normally.  Your blood pressure will be taken.  Your abdomen will be measured to track your baby's growth.  The fetal heartbeat will be listened to.  Any test results from the previous visit will be discussed. Your caregiver may ask you:  How you are feeling.  If you are feeling the baby move.  If you have had any abnormal symptoms, such as leaking fluid, bleeding, severe headaches, or abdominal cramping.  If you have any questions. Other tests that may be performed during your second trimester include:  Blood tests that check for:  Low iron levels (anemia).  Gestational diabetes (between 24 and 28 weeks).  Rh antibodies.  Urine tests to check for infections, diabetes, or protein in the urine.  An ultrasound to confirm the proper growth and development of the baby.  An amniocentesis to check for possible genetic problems.  Fetal screens for spina bifida and Down syndrome. HOME CARE INSTRUCTIONS   Avoid all smoking, herbs, alcohol, and unprescribed drugs. These chemicals affect the formation and growth of the baby.  Follow your caregiver's instructions regarding medicine use. There are medicines that are either safe or unsafe to take during pregnancy.  Exercise only as directed by your caregiver. Experiencing uterine cramps is a good sign to stop exercising.  Continue to eat regular,   healthy meals.  Wear a good support bra for breast tenderness.  Do not use hot tubs, steam rooms, or saunas.  Wear your seat belt at all times when driving.  Avoid raw meat, uncooked cheese, cat litter boxes, and soil used by cats. These carry germs that can cause birth defects in the baby.  Take your prenatal vitamins.  Try taking a stool softener (if your caregiver approves) if you develop constipation. Eat more high-fiber foods,  such as fresh vegetables or fruit and whole grains. Drink plenty of fluids to keep your urine clear or pale yellow.  Take warm sitz baths to soothe any pain or discomfort caused by hemorrhoids. Use hemorrhoid cream if your caregiver approves.  If you develop varicose veins, wear support hose. Elevate your feet for 15 minutes, 3 4 times a day. Limit salt in your diet.  Avoid heavy lifting, wear low heel shoes, and practice good posture.  Rest with your legs elevated if you have leg cramps or low back pain.  Visit your dentist if you have not gone yet during your pregnancy. Use a soft toothbrush to brush your teeth and be gentle when you floss.  A sexual relationship may be continued unless your caregiver directs you otherwise.  Continue to go to all your prenatal visits as directed by your caregiver. SEEK MEDICAL CARE IF:   You have dizziness.  You have mild pelvic cramps, pelvic pressure, or nagging pain in the abdominal area.  You have persistent nausea, vomiting, or diarrhea.  You have a bad smelling vaginal discharge.  You have pain with urination. SEEK IMMEDIATE MEDICAL CARE IF:   You have a fever.  You are leaking fluid from your vagina.  You have spotting or bleeding from your vagina.  You have severe abdominal cramping or pain.  You have rapid weight gain or loss.  You have shortness of breath with chest pain.  You notice sudden or extreme swelling of your face, hands, ankles, feet, or legs.  You have not felt your baby move in over an hour.  You have severe headaches that do not go away with medicine.  You have vision changes. Document Released: 04/15/2001 Document Revised: 12/22/2012 Document Reviewed: 06/22/2012 [employer] Patient Information 2014 [employer].  

## 2013-11-09 ENCOUNTER — Ambulatory Visit (INDEPENDENT_AMBULATORY_CARE_PROVIDER_SITE_OTHER): Payer: Medicaid Other | Admitting: Family

## 2013-11-09 ENCOUNTER — Encounter: Payer: Medicaid Other | Admitting: Advanced Practice Midwife

## 2013-11-09 VITALS — BP 113/73 | HR 74 | Wt 135.9 lb

## 2013-11-09 DIAGNOSIS — Z3490 Encounter for supervision of normal pregnancy, unspecified, unspecified trimester: Secondary | ICD-10-CM

## 2013-11-09 DIAGNOSIS — Z3480 Encounter for supervision of other normal pregnancy, unspecified trimester: Secondary | ICD-10-CM

## 2013-11-09 DIAGNOSIS — O23599 Infection of other part of genital tract in pregnancy, unspecified trimester: Secondary | ICD-10-CM

## 2013-11-09 DIAGNOSIS — N760 Acute vaginitis: Secondary | ICD-10-CM

## 2013-11-09 DIAGNOSIS — O2390 Unspecified genitourinary tract infection in pregnancy, unspecified trimester: Secondary | ICD-10-CM

## 2013-11-09 DIAGNOSIS — O09299 Supervision of pregnancy with other poor reproductive or obstetric history, unspecified trimester: Secondary | ICD-10-CM

## 2013-11-09 LAB — POCT URINALYSIS DIP (DEVICE)
BILIRUBIN URINE: NEGATIVE
Glucose, UA: NEGATIVE mg/dL
HGB URINE DIPSTICK: NEGATIVE
Ketones, ur: NEGATIVE mg/dL
Nitrite: NEGATIVE
PH: 6.5 (ref 5.0–8.0)
PROTEIN: NEGATIVE mg/dL
SPECIFIC GRAVITY, URINE: 1.02 (ref 1.005–1.030)
Urobilinogen, UA: 0.2 mg/dL (ref 0.0–1.0)

## 2013-11-09 MED ORDER — FLUCONAZOLE 150 MG PO TABS: 150.0000 mg | ORAL_TABLET | Freq: Once | ORAL | Status: DC

## 2013-11-09 NOTE — Progress Notes (Signed)
Reports white vaginal discharge with itching.  Recently on antibiotics for UTI.  RX Diflucan, wet prep collected.  Follow-up anatomy scan ordered.

## 2013-11-09 NOTE — Progress Notes (Signed)
Has white discharge with itching.

## 2013-11-10 LAB — WET PREP, GENITAL
CLUE CELLS WET PREP: NONE SEEN
Trich, Wet Prep: NONE SEEN

## 2013-11-21 ENCOUNTER — Ambulatory Visit (HOSPITAL_COMMUNITY)
Admission: RE | Admit: 2013-11-21 | Discharge: 2013-11-21 | Disposition: A | Discharge status: Home / Self Care | Payer: Medicaid Other | Source: Ambulatory Visit | Attending: Advanced Practice Midwife | Admitting: Advanced Practice Midwife

## 2013-11-21 DIAGNOSIS — Z3689 Encounter for other specified antenatal screening: Secondary | ICD-10-CM | POA: Diagnosis not present

## 2013-11-21 DIAGNOSIS — O09299 Supervision of pregnancy with other poor reproductive or obstetric history, unspecified trimester: Secondary | ICD-10-CM | POA: Diagnosis not present

## 2013-12-07 ENCOUNTER — Ambulatory Visit (INDEPENDENT_AMBULATORY_CARE_PROVIDER_SITE_OTHER): Payer: Medicaid Other | Admitting: Family

## 2013-12-07 VITALS — BP 124/82 | HR 76 | Wt 138.5 lb

## 2013-12-07 DIAGNOSIS — O09292 Supervision of pregnancy with other poor reproductive or obstetric history, second trimester: Secondary | ICD-10-CM

## 2013-12-07 DIAGNOSIS — O09299 Supervision of pregnancy with other poor reproductive or obstetric history, unspecified trimester: Secondary | ICD-10-CM

## 2013-12-07 LAB — POCT URINALYSIS DIP (DEVICE)
Bilirubin Urine: NEGATIVE
Glucose, UA: NEGATIVE mg/dL
Ketones, ur: NEGATIVE mg/dL
Nitrite: NEGATIVE
PROTEIN: NEGATIVE mg/dL
Specific Gravity, Urine: 1.015 (ref 1.005–1.030)
Urobilinogen, UA: 4 mg/dL — ABNORMAL HIGH (ref 0.0–1.0)
pH: 7 (ref 5.0–8.0)

## 2013-12-07 LAB — CBC
HCT: 33.4 % — ABNORMAL LOW (ref 36.0–46.0)
HEMOGLOBIN: 11.8 g/dL — AB (ref 12.0–15.0)
MCH: 31 pg (ref 26.0–34.0)
MCHC: 35.3 g/dL (ref 30.0–36.0)
MCV: 87.7 fL (ref 78.0–100.0)
Platelets: 248 10*3/uL (ref 150–400)
RBC: 3.81 MIL/uL — ABNORMAL LOW (ref 3.87–5.11)
RDW: 13.3 % (ref 11.5–15.5)
WBC: 10.7 10*3/uL — ABNORMAL HIGH (ref 4.0–10.5)

## 2013-12-07 LAB — OB RESULTS CONSOLE GBS: GBS: POSITIVE

## 2013-12-07 NOTE — Progress Notes (Signed)
Reports lower pelvic pressure after working and on feet.  Denies vaginal bleeding or leaking of fluid.  Reviewed labor precautions.  1 hr today.  Reviewed repeat ultrasound (choroid plexus resolved).

## 2013-12-07 NOTE — Progress Notes (Signed)
Having pain in her lower abdomen.  1hr today due at [phone]

## 2013-12-08 LAB — GLUCOSE TOLERANCE, 1 HOUR (50G) W/O FASTING: GLUCOSE 1 HOUR GTT: 134 mg/dL (ref 70–140)

## 2013-12-08 LAB — RPR

## 2013-12-08 LAB — HIV ANTIBODY (ROUTINE TESTING W REFLEX): HIV: NONREACTIVE

## 2013-12-09 ENCOUNTER — Encounter: Payer: Self-pay | Admitting: Family

## 2013-12-10 LAB — CULTURE, OB URINE

## 2013-12-12 ENCOUNTER — Other Ambulatory Visit: Payer: Self-pay | Admitting: Family

## 2013-12-12 DIAGNOSIS — O09292 Supervision of pregnancy with other poor reproductive or obstetric history, second trimester: Secondary | ICD-10-CM

## 2013-12-12 MED ORDER — AMOXICILLIN 500 MG PO CAPS
500.0000 mg | ORAL_CAPSULE | Freq: Three times a day (TID) | ORAL | Status: DC
Start: 2013-12-12 — End: 2014-01-19

## 2013-12-12 NOTE — Progress Notes (Signed)
Left message on patient voicemail regarding needing to call clinic.  When pt calls inform regarding UTI and RX for Amoxicillin sent to pharmacy.

## 2013-12-26 ENCOUNTER — Encounter: Payer: Medicaid Other | Admitting: Physician Assistant

## 2014-01-02 ENCOUNTER — Encounter: Payer: Self-pay | Admitting: Advanced Practice Midwife

## 2014-01-02 ENCOUNTER — Ambulatory Visit (INDEPENDENT_AMBULATORY_CARE_PROVIDER_SITE_OTHER): Payer: Medicaid Other | Admitting: Advanced Practice Midwife

## 2014-01-02 VITALS — BP 114/70 | HR 90 | Wt 142.0 lb

## 2014-01-02 DIAGNOSIS — Z3490 Encounter for supervision of normal pregnancy, unspecified, unspecified trimester: Secondary | ICD-10-CM

## 2014-01-02 DIAGNOSIS — Z3480 Encounter for supervision of other normal pregnancy, unspecified trimester: Secondary | ICD-10-CM

## 2014-01-02 LAB — POCT URINALYSIS DIP (DEVICE)
Bilirubin Urine: NEGATIVE
Glucose, UA: NEGATIVE mg/dL
KETONES UR: NEGATIVE mg/dL
Nitrite: NEGATIVE
PH: 7 (ref 5.0–8.0)
Protein, ur: NEGATIVE mg/dL
SPECIFIC GRAVITY, URINE: 1.015 (ref 1.005–1.030)
Urobilinogen, UA: 2 mg/dL — ABNORMAL HIGH (ref 0.0–1.0)

## 2014-01-02 LAB — OB RESULTS CONSOLE GBS: GBS: POSITIVE

## 2014-01-02 NOTE — Patient Instructions (Signed)
Third Trimester of Pregnancy The third trimester is from week 29 through week 42, months 7 through 9. The third trimester is a time when the fetus is growing rapidly. At the end of the ninth month, the fetus is about 20 inches in length and weighs 6-10 pounds.  BODY CHANGES Your body goes through many changes during pregnancy. The changes vary from woman to woman.   Your weight will continue to increase. You can expect to gain 25-35 pounds (11-16 kg) by the end of the pregnancy.  You may begin to get stretch marks on your hips, abdomen, and breasts.  You may urinate more often because the fetus is moving lower into your pelvis and pressing on your bladder.  You may develop or continue to have heartburn as a result of your pregnancy.  You may develop constipation because certain hormones are causing the muscles that push waste through your intestines to slow down.  You may develop hemorrhoids or swollen, bulging veins (varicose veins).  You may have pelvic pain because of the weight gain and pregnancy hormones relaxing your joints between the bones in your pelvis. Backaches may result from overexertion of the muscles supporting your posture.  You may have changes in your hair. These can include thickening of your hair, rapid growth, and changes in texture. Some women also have hair loss during or after pregnancy, or hair that feels dry or thin. Your hair will most likely return to normal after your baby is born.  Your breasts will continue to grow and be tender. A yellow discharge may leak from your breasts called colostrum.  Your belly button may stick out.  You may feel short of breath because of your expanding uterus.  You may notice the fetus "dropping," or moving lower in your abdomen.  You may have a bloody mucus discharge. This usually occurs a few days to a week before labor begins.  Your cervix becomes thin and soft (effaced) near your due date. WHAT TO EXPECT AT YOUR PRENATAL  EXAMS  You will have prenatal exams every 2 weeks until week 36. Then, you will have weekly prenatal exams. During a routine prenatal visit:  You will be weighed to make sure you and the fetus are growing normally.  Your blood pressure is taken.  Your abdomen will be measured to track your baby's growth.  The fetal heartbeat will be listened to.  Any test results from the previous visit will be discussed.  You may have a cervical check near your due date to see if you have effaced. At around 36 weeks, your caregiver will check your cervix. At the same time, your caregiver will also perform a test on the secretions of the vaginal tissue. This test is to determine if a type of bacteria, Group B streptococcus, is present. Your caregiver will explain this further. Your caregiver may ask you:  What your birth plan is.  How you are feeling.  If you are feeling the baby move.  If you have had any abnormal symptoms, such as leaking fluid, bleeding, severe headaches, or abdominal cramping.  If you have any questions. Other tests or screenings that may be performed during your third trimester include:  Blood tests that check for low iron levels (anemia).  Fetal testing to check the health, activity level, and growth of the fetus. Testing is done if you have certain medical conditions or if there are problems during the pregnancy. FALSE LABOR You may feel small, irregular contractions that   eventually go away. These are called Braxton Hicks contractions, or false labor. Contractions may last for hours, days, or even weeks before true labor sets in. If contractions come at regular intervals, intensify, or become painful, it is best to be seen by your caregiver.  SIGNS OF LABOR   Menstrual-like cramps.  Contractions that are 5 minutes apart or less.  Contractions that start on the top of the uterus and spread down to the lower abdomen and back.  A sense of increased pelvic pressure or back  pain.  A watery or bloody mucus discharge that comes from the vagina. If you have any of these signs before the 37th week of pregnancy, call your caregiver right away. You need to go to the hospital to get checked immediately. HOME CARE INSTRUCTIONS   Avoid all smoking, herbs, alcohol, and unprescribed drugs. These chemicals affect the formation and growth of the baby.  Follow your caregiver's instructions regarding medicine use. There are medicines that are either safe or unsafe to take during pregnancy.  Exercise only as directed by your caregiver. Experiencing uterine cramps is a good sign to stop exercising.  Continue to eat regular, healthy meals.  Wear a good support bra for breast tenderness.  Do not use hot tubs, steam rooms, or saunas.  Wear your seat belt at all times when driving.  Avoid raw meat, uncooked cheese, cat litter boxes, and soil used by cats. These carry germs that can cause birth defects in the baby.  Take your prenatal vitamins.  Try taking a stool softener (if your caregiver approves) if you develop constipation. Eat more high-fiber foods, such as fresh vegetables or fruit and whole grains. Drink plenty of fluids to keep your urine clear or pale yellow.  Take warm sitz baths to soothe any pain or discomfort caused by hemorrhoids. Use hemorrhoid cream if your caregiver approves.  If you develop varicose veins, wear support hose. Elevate your feet for 15 minutes, 3-4 times a day. Limit salt in your diet.  Avoid heavy lifting, wear low heal shoes, and practice good posture.  Rest a lot with your legs elevated if you have leg cramps or low back pain.  Visit your dentist if you have not gone during your pregnancy. Use a soft toothbrush to brush your teeth and be gentle when you floss.  A sexual relationship may be continued unless your caregiver directs you otherwise.  Do not travel far distances unless it is absolutely necessary and only with the approval  of your caregiver.  Take prenatal classes to understand, practice, and ask questions about the labor and delivery.  Make a trial run to the hospital.  Pack your hospital bag.  Prepare the baby's nursery.  Continue to go to all your prenatal visits as directed by your caregiver. SEEK MEDICAL CARE IF:  You are unsure if you are in labor or if your water has broken.  You have dizziness.  You have mild pelvic cramps, pelvic pressure, or nagging pain in your abdominal area.  You have persistent nausea, vomiting, or diarrhea.  You have a bad smelling vaginal discharge.  You have pain with urination. SEEK IMMEDIATE MEDICAL CARE IF:   You have a fever.  You are leaking fluid from your vagina.  You have spotting or bleeding from your vagina.  You have severe abdominal cramping or pain.  You have rapid weight loss or gain.  You have shortness of breath with chest pain.  You notice sudden or extreme swelling   of your face, hands, ankles, feet, or legs.  You have not felt your baby move in over an hour.  You have severe headaches that do not go away with medicine.  You have vision changes. Document Released: 04/15/2001 Document Revised: 04/26/2013 Document Reviewed: 06/22/2012 [employer] Patient Information 2015 [employer]. This information is not intended to replace advice given to you by your health care provider. Make sure you discuss any questions you have with your health care provider.  

## 2014-01-02 NOTE — Progress Notes (Signed)
Informed glucola normal at 134. Feels well, has some B-H contractions, less than 5/hour. PTL precautions reviewed.

## 2014-01-19 ENCOUNTER — Ambulatory Visit (INDEPENDENT_AMBULATORY_CARE_PROVIDER_SITE_OTHER): Payer: Medicaid Other | Admitting: Family

## 2014-01-19 VITALS — BP 127/73 | HR 78 | Temp 98.0°F | Wt 142.4 lb

## 2014-01-19 DIAGNOSIS — N898 Other specified noninflammatory disorders of vagina: Secondary | ICD-10-CM

## 2014-01-19 DIAGNOSIS — Z23 Encounter for immunization: Secondary | ICD-10-CM

## 2014-01-19 DIAGNOSIS — O09299 Supervision of pregnancy with other poor reproductive or obstetric history, unspecified trimester: Secondary | ICD-10-CM

## 2014-01-19 DIAGNOSIS — O09293 Supervision of pregnancy with other poor reproductive or obstetric history, third trimester: Secondary | ICD-10-CM

## 2014-01-19 LAB — POCT URINALYSIS DIP (DEVICE)
Glucose, UA: NEGATIVE mg/dL
HGB URINE DIPSTICK: NEGATIVE
Ketones, ur: NEGATIVE mg/dL
NITRITE: NEGATIVE
Protein, ur: 30 mg/dL — AB
Specific Gravity, Urine: 1.015 (ref 1.005–1.030)
UROBILINOGEN UA: 1 mg/dL (ref 0.0–1.0)
pH: 8.5 — ABNORMAL HIGH (ref 5.0–8.0)

## 2014-01-19 MED ORDER — METRONIDAZOLE 500 MG PO TABS: 500.0000 mg | ORAL_TABLET | Freq: Two times a day (BID) | ORAL | Status: DC

## 2014-01-19 NOTE — Progress Notes (Signed)
Increased Braxton Hicks contractions while at work.  Reports about two throughout day.  Denies vaginal bleeding or leaking of fluid.  +white curdy discharge, no vaginal itching or burning.  +odor.  Desires letter stating work restrictions.  Works at [employer] approximately 35 hours/week.  Increased standing.  Existing work restrictions letter given to the patient.  Wet prep collected, RX flagyl sent.  flu vaccine today.

## 2014-01-19 NOTE — Progress Notes (Signed)
Pt has curdy discharge.  Pt is having contractions while at work and has concerns about some restrictions.  Needs a note for her employer.

## 2014-01-20 LAB — WET PREP, GENITAL
CLUE CELLS WET PREP: NONE SEEN
TRICH WET PREP: NONE SEEN

## 2014-01-25 ENCOUNTER — Other Ambulatory Visit: Payer: Self-pay | Admitting: Family

## 2014-01-25 MED ORDER — FLUCONAZOLE 150 MG PO TABS: 150.0000 mg | ORAL_TABLET | Freq: Once | ORAL | Status: DC

## 2014-01-25 NOTE — Progress Notes (Signed)
Pt called to notify regarding +yeast on wet prep and RX sent to pharmacy.  Please inform when patient returns call.

## 2014-02-03 ENCOUNTER — Ambulatory Visit (INDEPENDENT_AMBULATORY_CARE_PROVIDER_SITE_OTHER): Payer: Medicaid Other | Admitting: Obstetrics and Gynecology

## 2014-02-03 ENCOUNTER — Encounter: Payer: Self-pay | Admitting: Obstetrics and Gynecology

## 2014-02-03 ENCOUNTER — Encounter: Payer: Self-pay | Admitting: *Deleted

## 2014-02-03 VITALS — BP 120/87 | HR 77 | Wt 149.4 lb

## 2014-02-03 DIAGNOSIS — O23599 Infection of other part of genital tract in pregnancy, unspecified trimester: Secondary | ICD-10-CM

## 2014-02-03 DIAGNOSIS — O3663X1 Maternal care for excessive fetal growth, third trimester, fetus 1: Secondary | ICD-10-CM

## 2014-02-03 DIAGNOSIS — N760 Acute vaginitis: Secondary | ICD-10-CM

## 2014-02-03 LAB — POCT URINALYSIS DIP (DEVICE)
Bilirubin Urine: NEGATIVE
Glucose, UA: NEGATIVE mg/dL
Ketones, ur: NEGATIVE mg/dL
NITRITE: POSITIVE — AB
PH: 6.5 (ref 5.0–8.0)
PROTEIN: 30 mg/dL — AB
Specific Gravity, Urine: 1.02 (ref 1.005–1.030)
Urobilinogen, UA: 0.2 mg/dL (ref 0.0–1.0)

## 2014-02-03 MED ORDER — FLUCONAZOLE 150 MG PO TABS: 150.0000 mg | ORAL_TABLET | Freq: Once | ORAL | Status: DC

## 2014-02-03 NOTE — Patient Instructions (Addendum)
Third Trimester of Pregnancy The third trimester is from week 29 through week 42, months 7 through 9. The third trimester is a time when the fetus is growing rapidly. At the end of the ninth month, the fetus is about 20 inches in length and weighs 6-10 pounds.  BODY CHANGES Your body goes through many changes during pregnancy. The changes vary from woman to woman.   Your weight will continue to increase. You can expect to gain 25-35 pounds (11-16 kg) by the end of the pregnancy.  You may begin to get stretch marks on your hips, abdomen, and breasts.  You may urinate more often because the fetus is moving lower into your pelvis and pressing on your bladder.  You may develop or continue to have heartburn as a result of your pregnancy.  You may develop constipation because certain hormones are causing the muscles that push waste through your intestines to slow down.  You may develop hemorrhoids or swollen, bulging veins (varicose veins).  You may have pelvic pain because of the weight gain and pregnancy hormones relaxing your joints between the bones in your pelvis. Backaches may result from overexertion of the muscles supporting your posture.  You may have changes in your hair. These can include thickening of your hair, rapid growth, and changes in texture. Some women also have hair loss during or after pregnancy, or hair that feels dry or thin. Your hair will most likely return to normal after your baby is born.  Your breasts will continue to grow and be tender. A yellow discharge may leak from your breasts called colostrum.  Your belly button may stick out.  You may feel short of breath because of your expanding uterus.  You may notice the fetus "dropping," or moving lower in your abdomen.  You may have a bloody mucus discharge. This usually occurs a few days to a week before labor begins.  Your cervix becomes thin and soft (effaced) near your due date. WHAT TO EXPECT AT YOUR PRENATAL  EXAMS  You will have prenatal exams every 2 weeks until week 36. Then, you will have weekly prenatal exams. During a routine prenatal visit:  You will be weighed to make sure you and the fetus are growing normally.  Your blood pressure is taken.  Your abdomen will be measured to track your baby's growth.  The fetal heartbeat will be listened to.  Any test results from the previous visit will be discussed.  You may have a cervical check near your due date to see if you have effaced. At around 36 weeks, your caregiver will check your cervix. At the same time, your caregiver will also perform a test on the secretions of the vaginal tissue. This test is to determine if a type of bacteria, Group B streptococcus, is present. Your caregiver will explain this further. Your caregiver may ask you:  What your birth plan is.  How you are feeling.  If you are feeling the baby move.  If you have had any abnormal symptoms, such as leaking fluid, bleeding, severe headaches, or abdominal cramping.  If you have any questions. Other tests or screenings that may be performed during your third trimester include:  Blood tests that check for low iron levels (anemia).  Fetal testing to check the health, activity level, and growth of the fetus. Testing is done if you have certain medical conditions or if there are problems during the pregnancy. FALSE LABOR You may feel small, irregular contractions that   eventually go away. These are called Braxton Hicks contractions, or false labor. Contractions may last for hours, days, or even weeks before true labor sets in. If contractions come at regular intervals, intensify, or become painful, it is best to be seen by your caregiver.  SIGNS OF LABOR   Menstrual-like cramps.  Contractions that are 5 minutes apart or less.  Contractions that start on the top of the uterus and spread down to the lower abdomen and back.  A sense of increased pelvic pressure or back  pain.  A watery or bloody mucus discharge that comes from the vagina. If you have any of these signs before the 37th week of pregnancy, call your caregiver right away. You need to go to the hospital to get checked immediately. HOME CARE INSTRUCTIONS   Avoid all smoking, herbs, alcohol, and unprescribed drugs. These chemicals affect the formation and growth of the baby.  Follow your caregiver's instructions regarding medicine use. There are medicines that are either safe or unsafe to take during pregnancy.  Exercise only as directed by your caregiver. Experiencing uterine cramps is a good sign to stop exercising.  Continue to eat regular, healthy meals.  Wear a good support bra for breast tenderness.  Do not use hot tubs, steam rooms, or saunas.  Wear your seat belt at all times when driving.  Avoid raw meat, uncooked cheese, cat litter boxes, and soil used by cats. These carry germs that can cause birth defects in the baby.  Take your prenatal vitamins.  Try taking a stool softener (if your caregiver approves) if you develop constipation. Eat more high-fiber foods, such as fresh vegetables or fruit and whole grains. Drink plenty of fluids to keep your urine clear or pale yellow.  Take warm sitz baths to soothe any pain or discomfort caused by hemorrhoids. Use hemorrhoid cream if your caregiver approves.  If you develop varicose veins, wear support hose. Elevate your feet for 15 minutes, 3-4 times a day. Limit salt in your diet.  Avoid heavy lifting, wear low heal shoes, and practice good posture.  Rest a lot with your legs elevated if you have leg cramps or low back pain.  Visit your dentist if you have not gone during your pregnancy. Use a soft toothbrush to brush your teeth and be gentle when you floss.  A sexual relationship may be continued unless your caregiver directs you otherwise.  Do not travel far distances unless it is absolutely necessary and only with the approval  of your caregiver.  Take prenatal classes to understand, practice, and ask questions about the labor and delivery.  Make a trial run to the hospital.  Pack your hospital bag.  Prepare the baby's nursery.  Continue to go to all your prenatal visits as directed by your caregiver. SEEK MEDICAL CARE IF:  You are unsure if you are in labor or if your water has broken.  You have dizziness.  You have mild pelvic cramps, pelvic pressure, or nagging pain in your abdominal area.  You have persistent nausea, vomiting, or diarrhea.  You have a bad smelling vaginal discharge.  You have pain with urination. SEEK IMMEDIATE MEDICAL CARE IF:   You have a fever.  You are leaking fluid from your vagina.  You have spotting or bleeding from your vagina.  You have severe abdominal cramping or pain.  You have rapid weight loss or gain.  You have shortness of breath with chest pain.  You notice sudden or extreme swelling   of your face, hands, ankles, feet, or legs.  You have not felt your baby move in over an hour.  You have severe headaches that do not go away with medicine.  You have vision changes. Document Released: 04/15/2001 Document Revised: 04/26/2013 Document Reviewed: 06/22/2012 [facility] Patient Information 2015 [place], [place]. This information is not intended to replace advice given to you by your health care provider. Make sure you discuss any questions you have with your health care provider. Monilial Vaginitis Vaginitis in a soreness, swelling and redness (inflammation) of the vagina and vulva. Monilial vaginitis is not a sexually transmitted infection. CAUSES  Yeast vaginitis is caused by yeast (candida) that is normally found in your vagina. With a yeast infection, the candida has overgrown in number to a point that upsets the chemical balance. SYMPTOMS   White, thick vaginal discharge.  Swelling, itching, redness and irritation of the vagina and possibly the lips of  the vagina (vulva).  Burning or painful urination.  Painful intercourse. DIAGNOSIS  Things that may contribute to monilial vaginitis are:  Postmenopausal and virginal states.  Pregnancy.  Infections.  Being tired, sick or stressed, especially if you had monilial vaginitis in the past.  Diabetes. Good control will help lower the chance.  Birth control pills.  Tight fitting garments.  Using bubble bath, feminine sprays, douches or deodorant tampons.  Taking certain medications that kill germs (antibiotics).  Sporadic recurrence can occur if you become ill. TREATMENT  Your caregiver will give you medication.  There are several kinds of anti monilial vaginal creams and suppositories specific for monilial vaginitis. For recurrent yeast infections, use a suppository or cream in the vagina 2 times a week, or as directed.  Anti-monilial or steroid cream for the itching or irritation of the vulva may also be used. Get your caregiver's permission.  Painting the vagina with methylene blue solution may help if the monilial cream does not work.  Eating yogurt may help prevent monilial vaginitis. HOME CARE INSTRUCTIONS   Finish all medication as prescribed.  Do not have sex until treatment is completed or after your caregiver tells you it is okay.  Take warm sitz baths.  Do not douche.  Do not use tampons, especially scented ones.  Wear cotton underwear.  Avoid tight pants and panty hose.  Tell your sexual partner that you have a yeast infection. They should go to their caregiver if they have symptoms such as mild rash or itching.  Your sexual partner should be treated as well if your infection is difficult to eliminate.  Practice safer sex. Use condoms.  Some vaginal medications cause latex condoms to fail. Vaginal medications that harm condoms are:  Cleocin cream.  Butoconazole (Femstat).  Terconazole (Terazol) vaginal suppository.  Miconazole (Monistat) (may  be purchased over the counter). SEEK MEDICAL CARE IF:   You have a temperature by mouth above 102 F (38.9 C).  The infection is getting worse after 2 days of treatment.  The infection is not getting better after 3 days of treatment.  You develop blisters in or around your vagina.  You develop vaginal bleeding, and it is not your menstrual period.  You have pain when you urinate.  You develop intestinal problems.  You have pain with sexual intercourse. Document Released: 01/29/2005 Document Revised: 07/14/2011 Document Reviewed: 10/13/2008 [facility] Patient Information 2015 [place], [place]. This information is not intended to replace advice given to you by your health care provider. Make sure you discuss any questions you have with your  health care provider.  

## 2014-02-03 NOTE — Progress Notes (Signed)
Doing well. Discussed healthy diet. GCT was borderline. Still with thick white discharge.. (WP 2 wks ago showed yeast, was rx'd Flagyl but did not fill) Rx Diflucan. S:D discrepancy> [place] for growth.

## 2014-02-06 ENCOUNTER — Inpatient Hospital Stay (HOSPITAL_COMMUNITY)
Admission: AD | Admit: 2014-02-06 | Discharge: 2014-02-06 | Disposition: A | Discharge status: Home / Self Care | Payer: Medicaid Other | Source: Ambulatory Visit | Attending: Obstetrics & Gynecology | Admitting: Obstetrics & Gynecology

## 2014-02-06 ENCOUNTER — Encounter (HOSPITAL_COMMUNITY): Payer: Self-pay

## 2014-02-06 ENCOUNTER — Other Ambulatory Visit: Payer: Self-pay | Admitting: Obstetrics and Gynecology

## 2014-02-06 ENCOUNTER — Ambulatory Visit (HOSPITAL_COMMUNITY)
Admission: RE | Admit: 2014-02-06 | Discharge: 2014-02-06 | Disposition: A | Discharge status: Home / Self Care | Payer: Medicaid Other | Source: Ambulatory Visit | Attending: Obstetrics and Gynecology | Admitting: Obstetrics and Gynecology

## 2014-02-06 DIAGNOSIS — O26843 Uterine size-date discrepancy, third trimester: Secondary | ICD-10-CM | POA: Insufficient documentation

## 2014-02-06 DIAGNOSIS — O99333 Smoking (tobacco) complicating pregnancy, third trimester: Secondary | ICD-10-CM | POA: Diagnosis not present

## 2014-02-06 DIAGNOSIS — O23599 Infection of other part of genital tract in pregnancy, unspecified trimester: Principal | ICD-10-CM

## 2014-02-06 DIAGNOSIS — F17210 Nicotine dependence, cigarettes, uncomplicated: Secondary | ICD-10-CM | POA: Insufficient documentation

## 2014-02-06 DIAGNOSIS — Z3A34 34 weeks gestation of pregnancy: Secondary | ICD-10-CM

## 2014-02-06 DIAGNOSIS — O23593 Infection of other part of genital tract in pregnancy, third trimester: Secondary | ICD-10-CM | POA: Insufficient documentation

## 2014-02-06 DIAGNOSIS — N760 Acute vaginitis: Secondary | ICD-10-CM

## 2014-02-06 DIAGNOSIS — O365930 Maternal care for other known or suspected poor fetal growth, third trimester, not applicable or unspecified: Secondary | ICD-10-CM | POA: Insufficient documentation

## 2014-02-06 DIAGNOSIS — IMO0002 Reserved for concepts with insufficient information to code with codable children: Secondary | ICD-10-CM

## 2014-02-06 DIAGNOSIS — O365990 Maternal care for other known or suspected poor fetal growth, unspecified trimester, not applicable or unspecified: Secondary | ICD-10-CM

## 2014-02-06 NOTE — MAU Provider Note (Signed)
Chief Complaint:  No chief complaint on file.   First Provider Initiated Contact with Patient 02/06/14 1711      HPI: Paula Gilbert is a 23 y.o. [id number] at [redacted]w[redacted]d who presents to maternity admissions for NST.  Was seen in [facility] [place] today for fetal growth 2/2 size < dates.  At [place] noted:  Findings:  1. Single intrauterine pregnancy.  2. Estimated fetal weight is in the 18th%; the abdominal circumference is in the <3rd%.  3. Posterior placenta without evidence of previa.  4. Normal amniotic fluid index.  5. The limited anatomy survey is normal.  6. Normal umbilical artery Doppler studies.  7. Biophysical profile is 6/8 (-2 for breathing).  Left before NST, and thus was called to come to MAU for NST.  Endorses dec fetal movement over past 2 days (feeling less movement in AM) as well as mild [employer].   Denies leakage of fluid or vaginal bleeding.   Pregnancy Course:  Rubella NI IUGR  Past Medical History: Past Medical History  Diagnosis Date  . Infection     trich, chlamydia  . SGA (small for gestational age)     Past obstetric history: OB History  Gravida Para Term Preterm AB SAB TAB Ectopic Multiple Living  4 2 2  0 1 1 0 0 0 2    # Outcome Date GA Lbr Len/2nd Weight Sex Delivery Anes PTL Lv  4 CUR           3 SAB 03/2013             Comments: "lots of bleeding"  2 TRM 05/13/09 [redacted]w[redacted]d  6 lb 7 oz (2.92 kg) F SVD None N Y  1 TRM 06/16/08 [redacted]w[redacted]d  8 lb 11 oz (3.941 kg) F SVD EPI N Y      Past Surgical History: Past Surgical History  Procedure Laterality Date  . I&d extremity  02/07/2012    Procedure: IRRIGATION AND DEBRIDEMENT EXTREMITY;  Surgeon: Meredith Pel, MD;  Location: [place];  Service: Orthopedics;  Laterality: Bilateral;  . Orif tibia fracture  02/07/2012    Procedure: OPEN REDUCTION INTERNAL FIXATION (ORIF) TIBIA FRACTURE;  Surgeon: Meredith Pel, MD;  Location: [place];  Service: Orthopedics;  Laterality: Right;     Family History: Family  History  Problem Relation Age of Onset  . Hypertension Paternal Grandfather   . Hypertension Maternal Grandmother   . Diabetes Maternal Grandmother   . Hypertension Mother   . Hypertension Maternal Grandfather   . Diabetes Paternal Grandmother   . Hearing loss Neg Hx     Social History: History  Substance Use Topics  . Smoking status: Current Every Day Smoker -- 0.50 packs/day for 5 years    Types: Cigarettes  . Smokeless tobacco: Never Used  . Alcohol Use: No    Allergies: No Known Allergies  Meds:  Prescriptions prior to admission  Medication Sig Dispense Refill  . Prenatal Vit-Fe Fumarate-FA (PRENATAL VITAMINS PLUS) 27-1 MG TABS Take 1 tablet by mouth daily.  30 tablet  12    ROS: Pertinent findings in history of present illness.  Physical Exam  Blood pressure 129/86, pulse 84, temperature 98.1 F (36.7 C), temperature source Oral, resp. rate 18, height 5\' 7"  (1.702 m), weight 150 lb 4 oz (68.153 kg), last menstrual period 05/23/2013. GENERAL: Well-developed, well-nourished female in no acute distress.  HEENT: normocephalic HEART: normal rate RESP: normal effort ABDOMEN: Soft, non-tender, gravid appropriate for gestational age EXTREMITIES: Nontender, no  edema NEURO: alert and oriented  FHT:  Baseline 135 , moderate variability, accelerations present, no decelerations; reactive Contractions: irregular contractions, 3 total   Labs: No results found for this or any previous visit (from the past 24 hour(s)).  Imaging:  US Ob Follow Up  02/06/2014   OBSTETRICAL ULTRASOUND: This exam was performed within a [place] [facility]. The OB US report was generated in the AS system, and faxed to the ordering physician.   This report is available in the [facility]. See the AS Obstetric US report via the Image Link.  US Fetal Bpp W/o Non Stress  02/06/2014   OBSTETRICAL ULTRASOUND: This exam was performed within a [place] [facility]. The OB US  report was generated in the AS system, and faxed to the ordering physician.   This report is available in the [facility]. See the AS Obstetric US report via the Image Link.  [place] Ua Cord Doppler  02/06/2014   OBSTETRICAL ULTRASOUND: This exam was performed within a [place] [facility]. The OB US report was generated in the AS system, and faxed to the ordering physician.   This report is available in the [facility]. See the AS Obstetric US report via the Image Link.  MAU Course: Reactive NST with 4 accelerations, moderate variability  Assessment: 1. Uterine size date discrepancy pregnancy, third trimester     Plan:  BPP 8/10 with reactive NST Sent message to [facility] for f/up NST this week Discharge home Labor precautions and fetal kick counts  1. Fetal growth restriction:  - patient not counseled as this is a remote read  - recommend fetal growth in 2-3 weeks  - recommend antenatal testing with twice weekly NSTs and weekly AFI  - recommend weekly Doppler studies (scheduled for 10/9)  - if growth restriction remains uncomplicated (normal testing, normal AFI, normal Doppler studies, and appropriate interval fetal growth) recommend delivery at 38-39 weeks or sooner if clinically indicated  - recommend fetal kick counts  Follow-up Information   Follow up with [facility]. (As Scheduled. NST's twice a week. (Someone will call you with this appointment))    Contact information:   [place] [address] [phone]       Medication List    ASK your doctor about these medications       PRENATAL VITAMINS PLUS 27-1 MG Tabs  Take 1 tablet by mouth daily.        Josephine Cables, MD 02/06/2014 5:12 PM

## 2014-02-06 NOTE — MAU Note (Signed)
Pt states was here earlier for u/s for SGA, measuring 31 weeks. Was told to come to MAU for further eval.

## 2014-02-06 NOTE — Discharge Instructions (Signed)
Fetal Biophysical Profile °This is a test that measures five different variables of the fetus: Heart rate, breathing movement, total movement of the baby, fetal muscle tone, the amount of amniotic fluid, and the heart rate activity of the fetus. The five variables are measured individually and contribute either a 2 or a 0 to the overall scoring of the test. The measurements are as follows: °· Fetal heart rate activity. This is measured and scored in the same way as a non-stress test. The fetal heart rate is considered reactive when there are movement-associated fetal heart rate increases of at least 15 beats per minute above baseline, and 15 seconds in duration over a 20-minute period. A score of 2 is given for reactivity, and a score of 0 indicates that the fetal heart rate is non-reactive. °· Fetal breathing movements. This is scored based on fetal breathing movements and indicate fetal well-being. Their absence may indicate a low oxygen level for the fetus. Fetal breathing increases in frequency and uniformity after the 36th week of pregnancy. To earn a score of 2, the fetus must have at least one episode of fetal breathing lasting at least 60 seconds within a 30-minute observation. Absence of this breathing is scored a 0 on the BPP. °· Fetal body movements. Fetal activity is a reflection of brain integrity and function. The presence of at least three episodes of fetal movements within a 30-minute period is given a score of 2. A score of 0 is given with two or less movements in this time period. Fetal activity is highest 1 to 3 hours after the mother has eaten a meal. °· Fetal tone. In the uterus, the fetus is normally in a position of flexion. This means the head is bent down towards the knees. The fetus also stretches, rolls, and moves in the uterus. The arms, legs, trunk, and head may be flexed and extended. A score of 2 is earned when there is at least one episode of active extension with return flexion. A  score of 0 is given for slow extension with a return to only partial flexion. Fetal movement not followed by return to flexion, limbs or spine in extension, and an open fetal hand score 0. °· Amniotic fluid volume. Amniotic fluid volume has been demonstrated to be a good method of predicting fetal distress. Too little amniotic fluid has been associated with fetal abnormalities, slow uterine growth, and over due pregnancy. A score of 2 is given for this when there is at least one pocket of amniotic fluid that measures 1 cm in a specific area. A score of 0 indicates either that fluid is absent in most areas of the uterine cavity or that the largest pocket of fluid measures less than 1 cm. °PREPARATION FOR TEST °No preparation or fasting is necessary. °NORMAL FINDINGS °· A score of 8-10 points (if amniotic fluid volume is adequate). °· Possible critical values: Less than 4 may necessitate immediate delivery of fetus. °Ranges for normal findings may vary among different laboratories and hospitals. You should always check with your doctor after having lab work or other tests done to discuss the meaning of your test results and whether your values are considered within normal limits. °MEANING OF TEST  °Your caregiver will go over the test results with you and discuss the importance and meaning of your results, as well as treatment options and the need for additional tests if necessary. °OBTAINING THE TEST RESULTS  °It is your responsibility to obtain your test   results. Ask the lab or department performing the test when and how you will get your results. Document Released: 08/22/2004 Document Revised: 07/14/2011 Document Reviewed: 03/31/2008 [facility] Patient Information 2015 [place], [place]. This information is not intended to replace advice given to you by your health care provider. Make sure you discuss any questions you have with your health care provider. Fetal Movement Counts Patient Name:  __________________________________________________ Patient Due Date: ____________________ Performing a fetal movement count is highly recommended in high-risk pregnancies, but it is good for every pregnant woman to do. Your health care provider may ask you to start counting fetal movements at 28 weeks of the pregnancy. Fetal movements often increase:  After eating a full meal.  After physical activity.  After eating or drinking something sweet or cold.  At rest. Pay attention to when you feel the baby is most active. This will help you notice a pattern of your baby's sleep and wake cycles and what factors contribute to an increase in fetal movement. It is important to perform a fetal movement count at the same time each day when your baby is normally most active.  HOW TO COUNT FETAL MOVEMENTS 1. Find a quiet and comfortable area to sit or lie down on your left side. Lying on your left side provides the best blood and oxygen circulation to your baby. 2. Write down the day and time on a sheet of paper or in a journal. 3. Start counting kicks, flutters, swishes, rolls, or jabs in a 2-hour period. You should feel at least 10 movements within 2 hours. 4. If you do not feel 10 movements in 2 hours, wait 2-3 hours and count again. Look for a change in the pattern or not enough counts in 2 hours. SEEK MEDICAL CARE IF:  You feel less than 10 counts in 2 hours, tried twice.  There is no movement in over an hour.  The pattern is changing or taking longer each day to reach 10 counts in 2 hours.  You feel the baby is not moving as he or she usually does. Date: ____________ Movements: ____________ Start time: ____________ Elizebeth Koller time: ____________  Date: ____________ Movements: ____________ Start time: ____________ Elizebeth Koller time: ____________ Date: ____________ Movements: ____________ Start time: ____________ Elizebeth Koller time: ____________ Date: ____________ Movements: ____________ Start time: ____________  Elizebeth Koller time: ____________ Date: ____________ Movements: ____________ Start time: ____________ Elizebeth Koller time: ____________ Date: ____________ Movements: ____________ Start time: ____________ Elizebeth Koller time: ____________ Date: ____________ Movements: ____________ Start time: ____________ Elizebeth Koller time: ____________ Date: ____________ Movements: ____________ Start time: ____________ Elizebeth Koller time: ____________  Date: ____________ Movements: ____________ Start time: ____________ Elizebeth Koller time: ____________ Date: ____________ Movements: ____________ Start time: ____________ Elizebeth Koller time: ____________ Date: ____________ Movements: ____________ Start time: ____________ Elizebeth Koller time: ____________ Date: ____________ Movements: ____________ Start time: ____________ Elizebeth Koller time: ____________ Date: ____________ Movements: ____________ Start time: ____________ Elizebeth Koller time: ____________ Date: ____________ Movements: ____________ Start time: ____________ Elizebeth Koller time: ____________ Date: ____________ Movements: ____________ Start time: ____________ Elizebeth Koller time: ____________  Date: ____________ Movements: ____________ Start time: ____________ Elizebeth Koller time: ____________ Date: ____________ Movements: ____________ Start time: ____________ Elizebeth Koller time: ____________ Date: ____________ Movements: ____________ Start time: ____________ Elizebeth Koller time: ____________ Date: ____________ Movements: ____________ Start time: ____________ Elizebeth Koller time: ____________ Date: ____________ Movements: ____________ Start time: ____________ Elizebeth Koller time: ____________ Date: ____________ Movements: ____________ Start time: ____________ Elizebeth Koller time: ____________ Date: ____________ Movements: ____________ Start time: ____________ Elizebeth Koller time: ____________  Date: ____________ Movements: ____________ Start time: ____________ Elizebeth Koller time: ____________ Date: ____________ Movements: ____________ Start time: ____________ Elizebeth Koller  time: ____________ Date: ____________  Movements: ____________ Start time: ____________ Elizebeth Koller time: ____________ Date: ____________ Movements: ____________ Start time: ____________ Elizebeth Koller time: ____________ Date: ____________ Movements: ____________ Start time: ____________ Elizebeth Koller time: ____________ Date: ____________ Movements: ____________ Start time: ____________ Elizebeth Koller time: ____________ Date: ____________ Movements: ____________ Start time: ____________ Elizebeth Koller time: ____________  Date: ____________ Movements: ____________ Start time: ____________ Elizebeth Koller time: ____________ Date: ____________ Movements: ____________ Start time: ____________ Elizebeth Koller time: ____________ Date: ____________ Movements: ____________ Start time: ____________ Elizebeth Koller time: ____________ Date: ____________ Movements: ____________ Start time: ____________ Elizebeth Koller time: ____________ Date: ____________ Movements: ____________ Start time: ____________ Elizebeth Koller time: ____________ Date: ____________ Movements: ____________ Start time: ____________ Elizebeth Koller time: ____________ Date: ____________ Movements: ____________ Start time: ____________ Elizebeth Koller time: ____________  Date: ____________ Movements: ____________ Start time: ____________ Elizebeth Koller time: ____________ Date: ____________ Movements: ____________ Start time: ____________ Elizebeth Koller time: ____________ Date: ____________ Movements: ____________ Start time: ____________ Elizebeth Koller time: ____________ Date: ____________ Movements: ____________ Start time: ____________ Elizebeth Koller time: ____________ Date: ____________ Movements: ____________ Start time: ____________ Elizebeth Koller time: ____________ Date: ____________ Movements: ____________ Start time: ____________ Elizebeth Koller time: ____________ Date: ____________ Movements: ____________ Start time: ____________ Elizebeth Koller time: ____________  Date: ____________ Movements: ____________ Start time: ____________ Elizebeth Koller time: ____________ Date: ____________ Movements: ____________ Start time:  ____________ Elizebeth Koller time: ____________ Date: ____________ Movements: ____________ Start time: ____________ Elizebeth Koller time: ____________ Date: ____________ Movements: ____________ Start time: ____________ Elizebeth Koller time: ____________ Date: ____________ Movements: ____________ Start time: ____________ Elizebeth Koller time: ____________ Date: ____________ Movements: ____________ Start time: ____________ Elizebeth Koller time: ____________ Date: ____________ Movements: ____________ Start time: ____________ Elizebeth Koller time: ____________  Date: ____________ Movements: ____________ Start time: ____________ Elizebeth Koller time: ____________ Date: ____________ Movements: ____________ Start time: ____________ Elizebeth Koller time: ____________ Date: ____________ Movements: ____________ Start time: ____________ Elizebeth Koller time: ____________ Date: ____________ Movements: ____________ Start time: ____________ Elizebeth Koller time: ____________ Date: ____________ Movements: ____________ Start time: ____________ Elizebeth Koller time: ____________ Date: ____________ Movements: ____________ Start time: ____________ Elizebeth Koller time: ____________ Document Released: 05/21/2006 Document Revised: 09/05/2013 Document Reviewed: 02/16/2012 [employer] Patient Information 2015 [employer]. This information is not intended to replace advice given to you by your health care provider. Make sure you discuss any questions you have with your health care provider. Nonstress Test The nonstress test is a procedure that monitors the fetus's heartbeat. The test will monitor the heartbeat when the fetus is at rest and while the fetus is moving. In a healthy fetus, there will be an increase in fetal heart rate when the fetus moves or kicks. The heart rate will decrease at rest. This test helps determine if the fetus is healthy. Your health care provider will look at a number of patterns in the heart rate tracing to make sure your baby is thriving. If there is concern, your health care provider may order  additional tests or may suggest another course of action. This test is often done in the third trimester and can help determine if an early delivery is needed and safe. Common reasons to have this test are:  You are past your due date.  You have a high-risk pregnancy.  You are feeling less movement than normal.  You have lost a pregnancy in the past.  Your health care provider suspects fetal growth problems.  You have too much or too little amniotic fluid. BEFORE THE PROCEDURE  Eat a meal right before the test or as directed by your health care provider. Food may help stimulate fetal movements.  Use the restroom right before the test. PROCEDURE  Two belts will be placed around your  abdomen. These belts have monitors attached to them. One records the fetal heart rate and the other records uterine contractions.  You may be asked to lie down on your side or to stay sitting upright.  You may be given a button to press when you feel movement.  The fetal heartbeat is listened to and watched on a screen. The heartbeat is recorded on a sheet of paper.  If the fetus seems to be sleeping, you may be asked to drink some juice or soda, gently press your abdomen, or make some noise to wake the fetus. AFTER THE PROCEDURE  Your health care provider will discuss the test results with you and make recommendations for the near future. Document Released: 04/11/2002 Document Revised: 09/05/2013 Document Reviewed: 05/25/2012 [facility] Patient Information 2015 [place], [place]. This information is not intended to replace advice given to you by your health care provider. Make sure you discuss any questions you have with your health care provider.

## 2014-02-06 NOTE — Progress Notes (Signed)
[facility] ultrasound  Indication: 23 yr old [id number] at [redacted]w[redacted]d for fetal growth secondary to size<dates. Remote read.  Findings: 1. Single intrauterine pregnancy. 2. Estimated fetal weight is in the 18th%; the abdominal circumference is in the <3rd%. 3. Posterior placenta without evidence of previa. 4. Normal amniotic fluid index. 5. The limited anatomy survey is normal. 6. Normal umbilical artery Doppler studies. 7. Biophysical profile is 6/8 (-2 for breathing).  Recommendations: 1. Fetal growth restriction: - patient not counseled as this is a remote read - recommend fetal growth in 2-3 weeks - recommend antenatal testing with twice weekly NSTs and weekly AFI - recommend weekly Doppler studies (scheduled for 10/9) - if growth restriction remains uncomplicated (normal testing, normal AFI, normal Doppler studies, and appropriate interval fetal growth) recommend delivery at 38-39 weeks or sooner if clinically indicated - recommend fetal kick counts 2. BPP of 6/8: - patient was sent home prior to notifying MD - I have called the patient's father and am awaiting a return call to send patient to [facility]U for an NST  Dr. Gala Romney aware of the above plan and recommendations  [place], MD

## 2014-02-07 ENCOUNTER — Encounter: Payer: Self-pay | Admitting: *Deleted

## 2014-02-07 ENCOUNTER — Telehealth: Payer: Self-pay | Admitting: *Deleted

## 2014-02-07 NOTE — Telephone Encounter (Signed)
Called pt and informed her of need for clinic appt on 10/8 for Ob fu and NST.  She will also need weekly appts @ [facility] for [place] - we will discuss further at her clinic appt this week. Pt voiced understanding and agreed to appt @ 0930 on 10/8 for Ob fu and NST.  [place] appts @ [facility] scheduled with Lucrezia Europe.

## 2014-02-09 ENCOUNTER — Other Ambulatory Visit (HOSPITAL_COMMUNITY): Payer: Self-pay | Admitting: Obstetrics and Gynecology

## 2014-02-09 ENCOUNTER — Ambulatory Visit (INDEPENDENT_AMBULATORY_CARE_PROVIDER_SITE_OTHER): Payer: Medicaid Other | Admitting: Obstetrics & Gynecology

## 2014-02-09 VITALS — BP 136/96 | HR 81 | Wt 148.1 lb

## 2014-02-09 DIAGNOSIS — IMO0002 Reserved for concepts with insufficient information to code with codable children: Secondary | ICD-10-CM

## 2014-02-09 DIAGNOSIS — O365931 Maternal care for other known or suspected poor fetal growth, third trimester, fetus 1: Secondary | ICD-10-CM

## 2014-02-09 LAB — POCT URINALYSIS DIP (DEVICE)
Bilirubin Urine: NEGATIVE
Glucose, UA: NEGATIVE mg/dL
KETONES UR: NEGATIVE mg/dL
Nitrite: NEGATIVE
Protein, ur: NEGATIVE mg/dL
SPECIFIC GRAVITY, URINE: 1.015 (ref 1.005–1.030)
Urobilinogen, UA: 0.2 mg/dL (ref 0.0–1.0)
pH: 7 (ref 5.0–8.0)

## 2014-02-09 NOTE — Progress Notes (Signed)
NST/routine Elevated pressures denies headache, blurred vision, epigastric pain

## 2014-02-09 NOTE — Patient Instructions (Signed)
Third Trimester of Pregnancy The third trimester is from week 29 through week 42, months 7 through 9. The third trimester is a time when the fetus is growing rapidly. At the end of the ninth month, the fetus is about 20 inches in length and weighs 6-10 pounds.  BODY CHANGES Your body goes through many changes during pregnancy. The changes vary from woman to woman.   Your weight will continue to increase. You can expect to gain 25-35 pounds (11-16 kg) by the end of the pregnancy.  You may begin to get stretch marks on your hips, abdomen, and breasts.  You may urinate more often because the fetus is moving lower into your pelvis and pressing on your bladder.  You may develop or continue to have heartburn as a result of your pregnancy.  You may develop constipation because certain hormones are causing the muscles that push waste through your intestines to slow down.  You may develop hemorrhoids or swollen, bulging veins (varicose veins).  You may have pelvic pain because of the weight gain and pregnancy hormones relaxing your joints between the bones in your pelvis. Backaches may result from overexertion of the muscles supporting your posture.  You may have changes in your hair. These can include thickening of your hair, rapid growth, and changes in texture. Some women also have hair loss during or after pregnancy, or hair that feels dry or thin. Your hair will most likely return to normal after your baby is born.  Your breasts will continue to grow and be tender. A yellow discharge may leak from your breasts called colostrum.  Your belly button may stick out.  You may feel short of breath because of your expanding uterus.  You may notice the fetus "dropping," or moving lower in your abdomen.  You may have a bloody mucus discharge. This usually occurs a few days to a week before labor begins.  Your cervix becomes thin and soft (effaced) near your due date. WHAT TO EXPECT AT YOUR PRENATAL  EXAMS  You will have prenatal exams every 2 weeks until week 36. Then, you will have weekly prenatal exams. During a routine prenatal visit:  You will be weighed to make sure you and the fetus are growing normally.  Your blood pressure is taken.  Your abdomen will be measured to track your baby's growth.  The fetal heartbeat will be listened to.  Any test results from the previous visit will be discussed.  You may have a cervical check near your due date to see if you have effaced. At around 36 weeks, your caregiver will check your cervix. At the same time, your caregiver will also perform a test on the secretions of the vaginal tissue. This test is to determine if a type of bacteria, Group B streptococcus, is present. Your caregiver will explain this further. Your caregiver may ask you:  What your birth plan is.  How you are feeling.  If you are feeling the baby move.  If you have had any abnormal symptoms, such as leaking fluid, bleeding, severe headaches, or abdominal cramping.  If you have any questions. Other tests or screenings that may be performed during your third trimester include:  Blood tests that check for low iron levels (anemia).  Fetal testing to check the health, activity level, and growth of the fetus. Testing is done if you have certain medical conditions or if there are problems during the pregnancy. FALSE LABOR You may feel small, irregular contractions that   eventually go away. These are called Braxton Hicks contractions, or false labor. Contractions may last for hours, days, or even weeks before true labor sets in. If contractions come at regular intervals, intensify, or become painful, it is best to be seen by your caregiver.  SIGNS OF LABOR   Menstrual-like cramps.  Contractions that are 5 minutes apart or less.  Contractions that start on the top of the uterus and spread down to the lower abdomen and back.  A sense of increased pelvic pressure or back  pain.  A watery or bloody mucus discharge that comes from the vagina. If you have any of these signs before the 37th week of pregnancy, call your caregiver right away. You need to go to the hospital to get checked immediately. HOME CARE INSTRUCTIONS   Avoid all smoking, herbs, alcohol, and unprescribed drugs. These chemicals affect the formation and growth of the baby.  Follow your caregiver's instructions regarding medicine use. There are medicines that are either safe or unsafe to take during pregnancy.  Exercise only as directed by your caregiver. Experiencing uterine cramps is a good sign to stop exercising.  Continue to eat regular, healthy meals.  Wear a good support bra for breast tenderness.  Do not use hot tubs, steam rooms, or saunas.  Wear your seat belt at all times when driving.  Avoid raw meat, uncooked cheese, cat litter boxes, and soil used by cats. These carry germs that can cause birth defects in the baby.  Take your prenatal vitamins.  Try taking a stool softener (if your caregiver approves) if you develop constipation. Eat more high-fiber foods, such as fresh vegetables or fruit and whole grains. Drink plenty of fluids to keep your urine clear or pale yellow.  Take warm sitz baths to soothe any pain or discomfort caused by hemorrhoids. Use hemorrhoid cream if your caregiver approves.  If you develop varicose veins, wear support hose. Elevate your feet for 15 minutes, 3-4 times a day. Limit salt in your diet.  Avoid heavy lifting, wear low heal shoes, and practice good posture.  Rest a lot with your legs elevated if you have leg cramps or low back pain.  Visit your dentist if you have not gone during your pregnancy. Use a soft toothbrush to brush your teeth and be gentle when you floss.  A sexual relationship may be continued unless your caregiver directs you otherwise.  Do not travel far distances unless it is absolutely necessary and only with the approval  of your caregiver.  Take prenatal classes to understand, practice, and ask questions about the labor and delivery.  Make a trial run to the hospital.  Pack your hospital bag.  Prepare the baby's nursery.  Continue to go to all your prenatal visits as directed by your caregiver. SEEK MEDICAL CARE IF:  You are unsure if you are in labor or if your water has broken.  You have dizziness.  You have mild pelvic cramps, pelvic pressure, or nagging pain in your abdominal area.  You have persistent nausea, vomiting, or diarrhea.  You have a bad smelling vaginal discharge.  You have pain with urination. SEEK IMMEDIATE MEDICAL CARE IF:   You have a fever.  You are leaking fluid from your vagina.  You have spotting or bleeding from your vagina.  You have severe abdominal cramping or pain.  You have rapid weight loss or gain.  You have shortness of breath with chest pain.  You notice sudden or extreme swelling   of your face, hands, ankles, feet, or legs.  You have not felt your baby move in over an hour.  You have severe headaches that do not go away with medicine.  You have vision changes. Document Released: 04/15/2001 Document Revised: 04/26/2013 Document Reviewed: 06/22/2012 [employer] Patient Information 2015 [employer]. This information is not intended to replace advice given to you by your health care provider. Make sure you discuss any questions you have with your health care provider.  

## 2014-02-09 NOTE — Progress Notes (Signed)
NST reactive today, had BPP 10/5 8/10, contine 2/week testing and AFI, growth [place] 2+ weeks

## 2014-02-09 NOTE — MAU Provider Note (Signed)
Attestation of Attending Supervision of Advanced Practitioner (CNM/NP): Evaluation and management procedures were performed by the Advanced Practitioner under my supervision and collaboration. I have reviewed the Advanced Practitioner's note and chart, and I agree with the management and plan.  Prabhjot Maddux H. 10:21 AM

## 2014-02-10 ENCOUNTER — Ambulatory Visit (HOSPITAL_COMMUNITY): Payer: Medicaid Other

## 2014-02-10 ENCOUNTER — Other Ambulatory Visit: Payer: Self-pay | Admitting: Obstetrics and Gynecology

## 2014-02-10 DIAGNOSIS — IMO0002 Reserved for concepts with insufficient information to code with codable children: Secondary | ICD-10-CM

## 2014-02-14 ENCOUNTER — Encounter (HOSPITAL_COMMUNITY): Payer: Self-pay

## 2014-02-14 ENCOUNTER — Other Ambulatory Visit: Payer: Self-pay | Admitting: Obstetrics and Gynecology

## 2014-02-14 ENCOUNTER — Ambulatory Visit (HOSPITAL_COMMUNITY): Admission: RE | Admit: 2014-02-14 | Payer: Medicaid Other | Source: Ambulatory Visit

## 2014-02-14 ENCOUNTER — Ambulatory Visit (HOSPITAL_COMMUNITY)
Admission: RE | Admit: 2014-02-14 | Discharge: 2014-02-14 | Disposition: A | Discharge status: Home / Self Care | Payer: Medicaid Other | Source: Ambulatory Visit | Attending: Obstetrics and Gynecology | Admitting: Obstetrics and Gynecology

## 2014-02-14 VITALS — BP 126/83 | HR 91

## 2014-02-14 DIAGNOSIS — IMO0002 Reserved for concepts with insufficient information to code with codable children: Secondary | ICD-10-CM

## 2014-02-14 DIAGNOSIS — Z3A36 36 weeks gestation of pregnancy: Secondary | ICD-10-CM

## 2014-02-14 DIAGNOSIS — O365930 Maternal care for other known or suspected poor fetal growth, third trimester, not applicable or unspecified: Secondary | ICD-10-CM | POA: Insufficient documentation

## 2014-02-16 ENCOUNTER — Other Ambulatory Visit: Payer: Self-pay | Admitting: Obstetrics & Gynecology

## 2014-02-16 ENCOUNTER — Encounter: Payer: Medicaid Other | Admitting: Obstetrics and Gynecology

## 2014-02-16 ENCOUNTER — Ambulatory Visit (INDEPENDENT_AMBULATORY_CARE_PROVIDER_SITE_OTHER): Payer: Medicaid Other | Admitting: Obstetrics & Gynecology

## 2014-02-16 VITALS — BP 130/95 | HR 72 | Temp 98.0°F | Wt 148.1 lb

## 2014-02-16 DIAGNOSIS — O09293 Supervision of pregnancy with other poor reproductive or obstetric history, third trimester: Secondary | ICD-10-CM

## 2014-02-16 DIAGNOSIS — IMO0002 Reserved for concepts with insufficient information to code with codable children: Secondary | ICD-10-CM

## 2014-02-16 DIAGNOSIS — O365931 Maternal care for other known or suspected poor fetal growth, third trimester, fetus 1: Secondary | ICD-10-CM

## 2014-02-16 DIAGNOSIS — Z3493 Encounter for supervision of normal pregnancy, unspecified, third trimester: Secondary | ICD-10-CM

## 2014-02-16 DIAGNOSIS — Z23 Encounter for immunization: Secondary | ICD-10-CM

## 2014-02-16 LAB — POCT URINALYSIS DIP (DEVICE)
Bilirubin Urine: NEGATIVE
Glucose, UA: NEGATIVE mg/dL
Ketones, ur: NEGATIVE mg/dL
Nitrite: NEGATIVE
Protein, ur: 30 mg/dL — AB
Specific Gravity, Urine: 1.015 (ref 1.005–1.030)
Urobilinogen, UA: 2 mg/dL — ABNORMAL HIGH (ref 0.0–1.0)
pH: 6.5 (ref 5.0–8.0)

## 2014-02-16 LAB — OB RESULTS CONSOLE GC/CHLAMYDIA
Chlamydia: NEGATIVE
Gonorrhea: NEGATIVE

## 2014-02-16 LAB — OB RESULTS CONSOLE GBS: STREP GROUP B AG: POSITIVE

## 2014-02-16 MED ORDER — TETANUS-DIPHTH-ACELL PERTUSSIS 5-2.5-18.5 LF-MCG/0.5 IM SUSP: 0.5000 mL | Freq: Once | INTRAMUSCULAR | Status: DC

## 2014-02-16 NOTE — Progress Notes (Signed)
NST reactive today, no problems. CT GC GBS today

## 2014-02-16 NOTE — Progress Notes (Signed)
NST Denies any headache, blurred vision, or epigastric pain Blood pressures elevated

## 2014-02-16 NOTE — Patient Instructions (Signed)
Third Trimester of Pregnancy The third trimester is from week 29 through week 42, months 7 through 9. The third trimester is a time when the fetus is growing rapidly. At the end of the ninth month, the fetus is about 20 inches in length and weighs 6-10 pounds.  BODY CHANGES Your body goes through many changes during pregnancy. The changes vary from woman to woman.   Your weight will continue to increase. You can expect to gain 25-35 pounds (11-16 kg) by the end of the pregnancy.  You may begin to get stretch marks on your hips, abdomen, and breasts.  You may urinate more often because the fetus is moving lower into your pelvis and pressing on your bladder.  You may develop or continue to have heartburn as a result of your pregnancy.  You may develop constipation because certain hormones are causing the muscles that push waste through your intestines to slow down.  You may develop hemorrhoids or swollen, bulging veins (varicose veins).  You may have pelvic pain because of the weight gain and pregnancy hormones relaxing your joints between the bones in your pelvis. Backaches may result from overexertion of the muscles supporting your posture.  You may have changes in your hair. These can include thickening of your hair, rapid growth, and changes in texture. Some women also have hair loss during or after pregnancy, or hair that feels dry or thin. Your hair will most likely return to normal after your baby is born.  Your breasts will continue to grow and be tender. A yellow discharge may leak from your breasts called colostrum.  Your belly button may stick out.  You may feel short of breath because of your expanding uterus.  You may notice the fetus "dropping," or moving lower in your abdomen.  You may have a bloody mucus discharge. This usually occurs a few days to a week before labor begins.  Your cervix becomes thin and soft (effaced) near your due date. WHAT TO EXPECT AT YOUR PRENATAL  EXAMS  You will have prenatal exams every 2 weeks until week 36. Then, you will have weekly prenatal exams. During a routine prenatal visit:  You will be weighed to make sure you and the fetus are growing normally.  Your blood pressure is taken.  Your abdomen will be measured to track your baby's growth.  The fetal heartbeat will be listened to.  Any test results from the previous visit will be discussed.  You may have a cervical check near your due date to see if you have effaced. At around 36 weeks, your caregiver will check your cervix. At the same time, your caregiver will also perform a test on the secretions of the vaginal tissue. This test is to determine if a type of bacteria, Group B streptococcus, is present. Your caregiver will explain this further. Your caregiver may ask you:  What your birth plan is.  How you are feeling.  If you are feeling the baby move.  If you have had any abnormal symptoms, such as leaking fluid, bleeding, severe headaches, or abdominal cramping.  If you have any questions. Other tests or screenings that may be performed during your third trimester include:  Blood tests that check for low iron levels (anemia).  Fetal testing to check the health, activity level, and growth of the fetus. Testing is done if you have certain medical conditions or if there are problems during the pregnancy. FALSE LABOR You may feel small, irregular contractions that   eventually go away. These are called Braxton Hicks contractions, or false labor. Contractions may last for hours, days, or even weeks before true labor sets in. If contractions come at regular intervals, intensify, or become painful, it is best to be seen by your caregiver.  SIGNS OF LABOR   Menstrual-like cramps.  Contractions that are 5 minutes apart or less.  Contractions that start on the top of the uterus and spread down to the lower abdomen and back.  A sense of increased pelvic pressure or back  pain.  A watery or bloody mucus discharge that comes from the vagina. If you have any of these signs before the 37th week of pregnancy, call your caregiver right away. You need to go to the hospital to get checked immediately. HOME CARE INSTRUCTIONS   Avoid all smoking, herbs, alcohol, and unprescribed drugs. These chemicals affect the formation and growth of the baby.  Follow your caregiver's instructions regarding medicine use. There are medicines that are either safe or unsafe to take during pregnancy.  Exercise only as directed by your caregiver. Experiencing uterine cramps is a good sign to stop exercising.  Continue to eat regular, healthy meals.  Wear a good support bra for breast tenderness.  Do not use hot tubs, steam rooms, or saunas.  Wear your seat belt at all times when driving.  Avoid raw meat, uncooked cheese, cat litter boxes, and soil used by cats. These carry germs that can cause birth defects in the baby.  Take your prenatal vitamins.  Try taking a stool softener (if your caregiver approves) if you develop constipation. Eat more high-fiber foods, such as fresh vegetables or fruit and whole grains. Drink plenty of fluids to keep your urine clear or pale yellow.  Take warm sitz baths to soothe any pain or discomfort caused by hemorrhoids. Use hemorrhoid cream if your caregiver approves.  If you develop varicose veins, wear support hose. Elevate your feet for 15 minutes, 3-4 times a day. Limit salt in your diet.  Avoid heavy lifting, wear low heal shoes, and practice good posture.  Rest a lot with your legs elevated if you have leg cramps or low back pain.  Visit your dentist if you have not gone during your pregnancy. Use a soft toothbrush to brush your teeth and be gentle when you floss.  A sexual relationship may be continued unless your caregiver directs you otherwise.  Do not travel far distances unless it is absolutely necessary and only with the approval  of your caregiver.  Take prenatal classes to understand, practice, and ask questions about the labor and delivery.  Make a trial run to the hospital.  Pack your hospital bag.  Prepare the baby's nursery.  Continue to go to all your prenatal visits as directed by your caregiver. SEEK MEDICAL CARE IF:  You are unsure if you are in labor or if your water has broken.  You have dizziness.  You have mild pelvic cramps, pelvic pressure, or nagging pain in your abdominal area.  You have persistent nausea, vomiting, or diarrhea.  You have a bad smelling vaginal discharge.  You have pain with urination. SEEK IMMEDIATE MEDICAL CARE IF:   You have a fever.  You are leaking fluid from your vagina.  You have spotting or bleeding from your vagina.  You have severe abdominal cramping or pain.  You have rapid weight loss or gain.  You have shortness of breath with chest pain.  You notice sudden or extreme swelling   of your face, hands, ankles, feet, or legs.  You have not felt your baby move in over an hour.  You have severe headaches that do not go away with medicine.  You have vision changes. Document Released: 04/15/2001 Document Revised: 04/26/2013 Document Reviewed: 06/22/2012 [employer] Patient Information 2015 [employer]. This information is not intended to replace advice given to you by your health care provider. Make sure you discuss any questions you have with your health care provider.  

## 2014-02-17 LAB — GC/CHLAMYDIA PROBE AMP
CT PROBE, AMP APTIMA: NEGATIVE
GC Probe RNA: NEGATIVE

## 2014-02-18 LAB — CULTURE, BETA STREP (GROUP B ONLY)

## 2014-02-20 ENCOUNTER — Ambulatory Visit (HOSPITAL_COMMUNITY)
Admission: RE | Admit: 2014-02-20 | Discharge: 2014-02-20 | Disposition: A | Discharge status: Home / Self Care | Payer: Medicaid Other | Source: Ambulatory Visit | Attending: Obstetrics & Gynecology | Admitting: Obstetrics & Gynecology

## 2014-02-20 ENCOUNTER — Encounter (HOSPITAL_COMMUNITY): Payer: Self-pay

## 2014-02-20 VITALS — BP 138/103 | HR 69 | Wt 152.0 lb

## 2014-02-20 DIAGNOSIS — O365931 Maternal care for other known or suspected poor fetal growth, third trimester, fetus 1: Secondary | ICD-10-CM

## 2014-02-20 DIAGNOSIS — IMO0002 Reserved for concepts with insufficient information to code with codable children: Secondary | ICD-10-CM

## 2014-02-20 DIAGNOSIS — O365990 Maternal care for other known or suspected poor fetal growth, unspecified trimester, not applicable or unspecified: Secondary | ICD-10-CM | POA: Insufficient documentation

## 2014-02-20 DIAGNOSIS — Z3A00 Weeks of gestation of pregnancy not specified: Secondary | ICD-10-CM | POA: Insufficient documentation

## 2014-02-20 NOTE — ED Notes (Signed)
Pt states she has been leaking watery fluid since yesterday.  Had 2 gushes then, with none today.  Placed on monitor for NST.  Has AFI after.

## 2014-02-23 ENCOUNTER — Encounter (HOSPITAL_COMMUNITY): Payer: Medicaid Other | Admitting: Anesthesiology

## 2014-02-23 ENCOUNTER — Inpatient Hospital Stay (HOSPITAL_COMMUNITY)
Admission: AD | Admit: 2014-02-23 | Discharge: 2014-02-25 | DRG: 775 | Disposition: A | Discharge status: Home / Self Care | Payer: Medicaid Other | Source: Ambulatory Visit | Attending: Obstetrics & Gynecology | Admitting: Obstetrics & Gynecology

## 2014-02-23 ENCOUNTER — Inpatient Hospital Stay (HOSPITAL_COMMUNITY): Payer: Medicaid Other | Admitting: Anesthesiology

## 2014-02-23 ENCOUNTER — Encounter (HOSPITAL_COMMUNITY): Payer: Self-pay | Admitting: *Deleted

## 2014-02-23 ENCOUNTER — Ambulatory Visit (INDEPENDENT_AMBULATORY_CARE_PROVIDER_SITE_OTHER): Payer: Medicaid Other | Admitting: Obstetrics & Gynecology

## 2014-02-23 VITALS — BP 147/88 | HR 87 | Wt 154.2 lb

## 2014-02-23 DIAGNOSIS — Z3A37 37 weeks gestation of pregnancy: Secondary | ICD-10-CM | POA: Diagnosis present

## 2014-02-23 DIAGNOSIS — O365930 Maternal care for other known or suspected poor fetal growth, third trimester, not applicable or unspecified: Secondary | ICD-10-CM | POA: Diagnosis present

## 2014-02-23 DIAGNOSIS — Z87891 Personal history of nicotine dependence: Secondary | ICD-10-CM | POA: Diagnosis not present

## 2014-02-23 DIAGNOSIS — Z8249 Family history of ischemic heart disease and other diseases of the circulatory system: Secondary | ICD-10-CM | POA: Diagnosis not present

## 2014-02-23 DIAGNOSIS — Z833 Family history of diabetes mellitus: Secondary | ICD-10-CM

## 2014-02-23 DIAGNOSIS — O133 Gestational [pregnancy-induced] hypertension without significant proteinuria, third trimester: Secondary | ICD-10-CM

## 2014-02-23 DIAGNOSIS — O99824 Streptococcus B carrier state complicating childbirth: Secondary | ICD-10-CM | POA: Diagnosis present

## 2014-02-23 DIAGNOSIS — Z283 Underimmunization status: Secondary | ICD-10-CM

## 2014-02-23 DIAGNOSIS — O9989 Other specified diseases and conditions complicating pregnancy, childbirth and the puerperium: Secondary | ICD-10-CM

## 2014-02-23 DIAGNOSIS — O1503 Eclampsia in pregnancy, third trimester: Secondary | ICD-10-CM

## 2014-02-23 DIAGNOSIS — O365990 Maternal care for other known or suspected poor fetal growth, unspecified trimester, not applicable or unspecified: Secondary | ICD-10-CM

## 2014-02-23 DIAGNOSIS — O365931 Maternal care for other known or suspected poor fetal growth, third trimester, fetus 1: Secondary | ICD-10-CM

## 2014-02-23 DIAGNOSIS — Z2839 Other underimmunization status: Secondary | ICD-10-CM

## 2014-02-23 HISTORY — DX: Gestational (pregnancy-induced) hypertension without significant proteinuria, third trimester: O13.3

## 2014-02-23 LAB — COMPREHENSIVE METABOLIC PANEL
ALT: 10 U/L (ref 0–35)
AST: 14 U/L (ref 0–37)
Albumin: 2.8 g/dL — ABNORMAL LOW (ref 3.5–5.2)
Alkaline Phosphatase: 127 U/L — ABNORMAL HIGH (ref 39–117)
Anion gap: 11 (ref 5–15)
BUN: 7 mg/dL (ref 6–23)
CO2: 23 mEq/L (ref 19–32)
Calcium: 8.7 mg/dL (ref 8.4–10.5)
Chloride: 102 mEq/L (ref 96–112)
Creatinine, Ser: 0.57 mg/dL (ref 0.50–1.10)
GFR calc non Af Amer: >90 mL/min (ref >90)
Glucose, Bld: 103 mg/dL — ABNORMAL HIGH (ref 70–99)
Potassium: 3.8 mEq/L (ref 3.7–5.3)
SODIUM: 136 meq/L — AB (ref 137–147)
TOTAL PROTEIN: 6.3 g/dL (ref 6.0–8.3)
Total Bilirubin: 0.5 mg/dL (ref 0.3–1.2)

## 2014-02-23 LAB — PROTEIN / CREATININE RATIO, URINE
Creatinine, Urine: 174.34 mg/dL
Protein Creatinine Ratio: 0.28 — ABNORMAL HIGH (ref 0.00–0.15)
TOTAL PROTEIN, URINE: 48.2 mg/dL

## 2014-02-23 LAB — CBC
HEMATOCRIT: 35.8 % — AB (ref 36.0–46.0)
HEMOGLOBIN: 12.7 g/dL (ref 12.0–15.0)
MCH: 31.9 pg (ref 26.0–34.0)
MCHC: 35.5 g/dL (ref 30.0–36.0)
MCV: 89.9 fL (ref 78.0–100.0)
Platelets: 215 10*3/uL (ref 150–400)
RBC: 3.98 MIL/uL (ref 3.87–5.11)
RDW: 12.6 % (ref 11.5–15.5)
WBC: 11.9 10*3/uL — AB (ref 4.0–10.5)

## 2014-02-23 LAB — CBC WITH DIFFERENTIAL/PLATELET
BASOS ABS: 0 10*3/uL (ref 0.0–0.1)
Basophils Relative: 0 % (ref 0–1)
EOS ABS: 0.1 10*3/uL (ref 0.0–0.7)
Eosinophils Relative: 0 % (ref 0–5)
HCT: 36.8 % (ref 36.0–46.0)
Hemoglobin: 12.9 g/dL (ref 12.0–15.0)
Lymphocytes Relative: 20 % (ref 12–46)
Lymphs Abs: 2.5 10*3/uL (ref 0.7–4.0)
MCH: 31.5 pg (ref 26.0–34.0)
MCHC: 35.1 g/dL (ref 30.0–36.0)
MCV: 90 fL (ref 78.0–100.0)
Monocytes Absolute: 1 10*3/uL (ref 0.1–1.0)
Monocytes Relative: 8 % (ref 3–12)
Neutro Abs: 8.8 10*3/uL — ABNORMAL HIGH (ref 1.7–7.7)
Neutrophils Relative %: 71 % (ref 43–77)
PLATELETS: 208 10*3/uL (ref 150–400)
RBC: 4.09 MIL/uL (ref 3.87–5.11)
RDW: 12.7 % (ref 11.5–15.5)
WBC: 12.3 10*3/uL — ABNORMAL HIGH (ref 4.0–10.5)

## 2014-02-23 LAB — ABO/RH: ABO/RH(D): B POS

## 2014-02-23 LAB — TYPE AND SCREEN
ABO/RH(D): B POS
Antibody Screen: NEGATIVE

## 2014-02-23 LAB — RAPID HIV SCREEN (WH-MAU): SUDS RAPID HIV SCREEN: NONREACTIVE

## 2014-02-23 LAB — POCT URINALYSIS DIP (DEVICE)
Bilirubin Urine: NEGATIVE
Glucose, UA: NEGATIVE mg/dL
HGB URINE DIPSTICK: NEGATIVE
Ketones, ur: NEGATIVE mg/dL
NITRITE: NEGATIVE
Protein, ur: 100 mg/dL — AB
SPECIFIC GRAVITY, URINE: 1.025 (ref 1.005–1.030)
UROBILINOGEN UA: 1 mg/dL (ref 0.0–1.0)
pH: 7 (ref 5.0–8.0)

## 2014-02-23 MED ORDER — TERBUTALINE SULFATE 1 MG/ML IJ SOLN: 0.2500 mg | Freq: Once | INTRAMUSCULAR | Status: AC | PRN

## 2014-02-23 MED ORDER — LIDOCAINE HCL (PF) 1 % IJ SOLN
30.0000 mL | INTRAMUSCULAR | Status: DC | PRN
  Filled 2014-02-23: qty 30

## 2014-02-23 MED ORDER — FENTANYL 2.5 MCG/ML BUPIVACAINE 1/10 % EPIDURAL INFUSION (WH - ANES)
14.0000 mL/h | INTRAMUSCULAR | Status: DC | PRN
  Administered 2014-02-23: 14 mL/h via EPIDURAL
  Filled 2014-02-23: qty 125

## 2014-02-23 MED ORDER — OXYCODONE-ACETAMINOPHEN 5-325 MG PO TABS: 1.0000 | ORAL_TABLET | ORAL | Status: DC | PRN

## 2014-02-23 MED ORDER — LIDOCAINE HCL (PF) 1 % IJ SOLN
INTRAMUSCULAR | Status: DC | PRN
  Administered 2014-02-23 (×4): 4 mL

## 2014-02-23 MED ORDER — PENICILLIN G POTASSIUM 5000000 UNITS IJ SOLR
5.0000 10*6.[IU] | Freq: Once | INTRAVENOUS | Status: AC
  Administered 2014-02-23: 5 10*6.[IU] via INTRAVENOUS
  Filled 2014-02-23: qty 5

## 2014-02-23 MED ORDER — ACETAMINOPHEN 325 MG PO TABS: 650.0000 mg | ORAL_TABLET | ORAL | Status: DC | PRN

## 2014-02-23 MED ORDER — EPHEDRINE 5 MG/ML INJ
10.0000 mg | INTRAVENOUS | Status: DC | PRN
  Filled 2014-02-23: qty 2

## 2014-02-23 MED ORDER — OXYTOCIN 40 UNITS IN LACTATED RINGERS INFUSION - SIMPLE MED
62.5000 mL/h | INTRAVENOUS | Status: DC
  Filled 2014-02-23: qty 1000

## 2014-02-23 MED ORDER — PENICILLIN G POTASSIUM 5000000 UNITS IJ SOLR
2.5000 10*6.[IU] | INTRAVENOUS | Status: DC
  Administered 2014-02-23: 2.5 10*6.[IU] via INTRAVENOUS
  Filled 2014-02-23 (×6): qty 2.5

## 2014-02-23 MED ORDER — OXYTOCIN 40 UNITS IN LACTATED RINGERS INFUSION - SIMPLE MED
1.0000 m[IU]/min | INTRAVENOUS | Status: DC
  Administered 2014-02-23: 2 m[IU]/min via INTRAVENOUS

## 2014-02-23 MED ORDER — CITRIC ACID-SODIUM CITRATE 334-500 MG/5ML PO SOLN: 30.0000 mL | ORAL | Status: DC | PRN

## 2014-02-23 MED ORDER — OXYCODONE-ACETAMINOPHEN 5-325 MG PO TABS: 2.0000 | ORAL_TABLET | ORAL | Status: DC | PRN

## 2014-02-23 MED ORDER — OXYTOCIN BOLUS FROM INFUSION
500.0000 mL | INTRAVENOUS | Status: DC
  Administered 2014-02-23: 500 mL via INTRAVENOUS

## 2014-02-23 MED ORDER — LACTATED RINGERS IV SOLN
INTRAVENOUS | Status: DC
  Administered 2014-02-23: 17:00:00 via INTRAVENOUS

## 2014-02-23 MED ORDER — LACTATED RINGERS IV SOLN: 500.0000 mL | INTRAVENOUS | Status: DC | PRN

## 2014-02-23 MED ORDER — LACTATED RINGERS IV SOLN: 500.0000 mL | Freq: Once | INTRAVENOUS | Status: DC

## 2014-02-23 MED ORDER — FLEET ENEMA 7-19 GM/118ML RE ENEM: 1.0000 | ENEMA | RECTAL | Status: DC | PRN

## 2014-02-23 MED ORDER — DIPHENHYDRAMINE HCL 50 MG/ML IJ SOLN: 12.5000 mg | INTRAMUSCULAR | Status: DC | PRN

## 2014-02-23 MED ORDER — PHENYLEPHRINE 40 MCG/ML (10ML) SYRINGE FOR IV PUSH (FOR BLOOD PRESSURE SUPPORT)
80.0000 ug | PREFILLED_SYRINGE | INTRAVENOUS | Status: DC | PRN
  Filled 2014-02-23: qty 2
  Filled 2014-02-23: qty 10

## 2014-02-23 MED ORDER — FENTANYL CITRATE 0.05 MG/ML IJ SOLN
100.0000 ug | INTRAMUSCULAR | Status: DC | PRN
Start: 2014-02-23 — End: 2014-02-24
  Administered 2014-02-23: 100 ug via INTRAVENOUS

## 2014-02-23 MED ORDER — ONDANSETRON HCL 4 MG/2ML IJ SOLN
4.0000 mg | Freq: Four times a day (QID) | INTRAMUSCULAR | Status: DC | PRN
  Administered 2014-02-23 – 2014-02-24 (×2): 4 mg via INTRAVENOUS
  Filled 2014-02-23 (×2): qty 2

## 2014-02-23 MED ORDER — PHENYLEPHRINE 40 MCG/ML (10ML) SYRINGE FOR IV PUSH (FOR BLOOD PRESSURE SUPPORT)
80.0000 ug | PREFILLED_SYRINGE | INTRAVENOUS | Status: DC | PRN
  Filled 2014-02-23: qty 2

## 2014-02-23 MED ORDER — FENTANYL CITRATE 0.05 MG/ML IJ SOLN
INTRAMUSCULAR | Status: AC
  Filled 2014-02-23: qty 2

## 2014-02-23 NOTE — Progress Notes (Signed)
PPH score increased to Medium d/t PIH.

## 2014-02-23 NOTE — Progress Notes (Signed)
IOL 03/07/14 @ 730a.  Pt escorted to MAU.

## 2014-02-23 NOTE — Anesthesia Procedure Notes (Signed)
Epidural Patient location during procedure: OB Start time: 02/23/2014 9:59 PM  Staffing Anesthesiologist: Gergory Biello Performed by: anesthesiologist   Preanesthetic Checklist Completed: patient identified, site marked, surgical consent, pre-op evaluation, timeout performed, IV checked, risks and benefits discussed and monitors and equipment checked  Epidural Patient position: sitting Prep: site prepped and draped and DuraPrep Patient monitoring: continuous pulse ox and blood pressure Approach: midline Location: L3-L4 Injection technique: LOR air  Needle:  Needle type: Tuohy  Needle gauge: 17 G Needle length: 9 cm and 9 Needle insertion depth: 4.5 cm Catheter type: closed end flexible Catheter size: 19 Gauge Catheter at skin depth: 9.5 cm Test dose: negative  Assessment Events: blood not aspirated, injection not painful, no injection resistance, negative IV test and no paresthesia  Additional Notes Discussed risk of headache, infection, bleeding, nerve injury and failed or incomplete block.  Patient voices understanding and wishes to proceed.  Epidural placed easily on first attempt.  No paresthesia.  Patient tolerated procedure well with no apparent complications.  Paula Gilbert, MDReason for block:procedure for pain

## 2014-02-23 NOTE — Progress Notes (Signed)
   Paula Gilbert is a 23 y.o. [phone] at [redacted]w[redacted]d  admitted for induction of labor due to Hypertension.  Subjective: RN called me to evaluate patient with abnormal FHR pattern, difficulty tracing FHR.  Patient just received an epidural and is comfortable.  Objective: Filed Vitals:   02/23/14 2228 02/23/14 2231 02/23/14 2233 02/23/14 2236  BP:  156/93  158/94  Pulse: 84 76 73 67  Temp:      TempSrc:      Resp:  18  18  Height:      Weight:      SpO2:          FHT:  FHR: 120 bpm, variability: moderate,  accelerations:  Present,  decelerations:  Absent  Intermittent periods of ?loss of contact noted UC:   irregular, every 3-5 minutes SVE:   Dilation: 4 Effacement (%): 90 Station: -2 Exam by:: Dr Harolyn Rutherford Pitocin @ 8 mu/min FSE placed, AROM for clear fluid.  Small amount of bleeding noted. FSE showed baseline of 120 bpm with moderate variability, ?arrhythmia (PACs) occasionally, small accelerations, no decelerations  Labs: Lab Results  Component Value Date   WBC 11.9* 02/23/2014   HGB 12.7 02/23/2014   HCT 35.8* 02/23/2014   MCV 89.9 02/23/2014   PLT 215 02/23/2014    Assessment / Plan: Induction of labor due to gestational hypertension  Labor: Continue pitocin for now Fetal Wellbeing:  Category II, continue close observation Pain Control:  Continue epidural Anticipated MOD:  NSVD  Elga Santy A 02/23/2014, 10:54 PM

## 2014-02-23 NOTE — H&P (Signed)
Paula Gilbert is a 23 y.o. female presenting for IOL due to [facility] and term pregnancy.  +FM, denies LOF, VB, CTX.  Maternal Medical History:  Reason for admission: Nausea. Pt sent from clinic due to hypertension in term gestation w normal labs.   Fetal activity: Perceived fetal activity is normal.   Last perceived fetal movement was within the past hour.    Prenatal complications: PIH.     OB History   Grav Para Term Preterm Abortions TAB SAB Ect Mult Living   4 2 2  0 1 0 1 0 0 2     Past Medical History  Diagnosis Date  . Infection     trich, chlamydia  . SGA (small for gestational age)    Past Surgical History  Procedure Laterality Date  . I&d extremity  02/07/2012    Procedure: IRRIGATION AND DEBRIDEMENT EXTREMITY;  Surgeon: Meredith Pel, MD;  Location: [place];  Service: Orthopedics;  Laterality: Bilateral;  . Orif tibia fracture  02/07/2012    Procedure: OPEN REDUCTION INTERNAL FIXATION (ORIF) TIBIA FRACTURE;  Surgeon: Meredith Pel, MD;  Location: [place];  Service: Orthopedics;  Laterality: Right;   Family History: family history includes Diabetes in her maternal grandmother and paternal grandmother; Hypertension in her maternal grandfather, maternal grandmother, mother, and paternal grandfather. There is no history of Hearing loss. Social History:  reports that she quit smoking about 6 months ago. Her smoking use included Cigarettes. She has a 2.5 pack-year smoking history. She has never used smokeless tobacco. She reports that she does not drink alcohol or use illicit drugs.   Prenatal Transfer Tool  Maternal Diabetes: No Genetic Screening: Normal Maternal Ultrasounds/Referrals: Abnormal:  Findings:   IUGR Fetal Ultrasounds or other Referrals:  None Maternal Substance Abuse:  No Significant Maternal Medications:  None Significant Maternal Lab Results:  None Other Comments:  Gestational hypertension  Review of Systems  Constitutional: Negative for fever  and chills.  HENT: Negative for tinnitus.   Eyes: Negative for blurred vision.  Respiratory: Negative for cough.   Cardiovascular: Negative for chest pain and palpitations.  Gastrointestinal: Negative for nausea, vomiting and diarrhea.  Genitourinary: Negative for dysuria.  Skin: Negative for rash.  Neurological: Negative for dizziness and headaches.  Psychiatric/Behavioral: Negative for depression and substance abuse.      Blood pressure 147/98, pulse 75, temperature 98.2 F (36.8 C), temperature source Oral, resp. rate 16, height 5' 7"  (1.702 m), weight 69.491 kg (153 lb 3.2 oz), last menstrual period 05/23/2013, SpO2 99.00%. Maternal Exam:  Abdomen: Fetal presentation: vertex  Cervix: Cervix evaluated by digital exam.     Fetal Exam Fetal Monitor Review: Baseline rate: 140.  Variability: moderate (6-25 bpm).   Pattern: accelerations present and no decelerations.    Fetal State Assessment: Category I - tracings are normal.    Dilation: 3 Effacement (%): 50 Cervical Position: Posterior Station: -2 Presentation: Vertex Exam by:: Leira Regino,CNM  Physical Exam  Constitutional: She is oriented to person, place, and time. She appears well-developed and well-nourished.  HENT:  Head: Normocephalic.  Eyes: EOM are normal. Pupils are equal, round, and reactive to light.  Neck: No thyromegaly present.  Cardiovascular: Normal rate.   Respiratory: Effort normal.  Lymphadenopathy:    She has no cervical adenopathy.  Neurological: She is alert and oriented to person, place, and time. She has normal reflexes.  Skin: Skin is warm.  Psychiatric: She has a normal mood and affect.    Prenatal labs: ABO, Rh: B/POS/-- (  03/17 0907) Antibody: NEG (03/17 0907) Rubella: 0.46 (03/17 0907) RPR: NON REAC (08/05 1016)  HBsAg: NEGATIVE (03/17 0907)  HIV: NONREACTIVE (08/05 1016)  GBS: Positive (10/15 0000)   Assessment/Plan: Paula Gilbert is a 23 y.o. female presenting for IOL due to  [facility] and term pregnancy.  #Term Gestation --Admit to birthing suites on L&D --IV access --Epidural if desired --Cervix favorable  #GHTN --No signs/sx of pre-eclampsia. --Labs normal --No BP treatment indicated at this time --IOL due to term gestation w GHTN.  #GBS Positive --Penicillin 2.5 million units run over 1 hour  #Rubella non-immune --MMR postpartum  Paula Gilbert 02/23/2014, 4:50 PM  I was present for the exam and agree with above.  [place], CNM 02/23/2014 8:16 PM

## 2014-02-23 NOTE — MAU Note (Signed)
Patient states she was in the clinic this am and had elevated blood pressure. States she is having irregular contractions, no bleeding or leaking fluid. Reports good fetal movement. Denies headaches or visual changes.

## 2014-02-23 NOTE — Progress Notes (Signed)
   Paula Gilbert is a 23 y.o. [phone] at [redacted]w[redacted]d  admitted for induction of labor due to Hypertension.  Subjective:   desires epidural Objective: Filed Vitals:   02/23/14 1829 02/23/14 1857 02/23/14 1921 02/23/14 1930  BP: 145/99 160/106 141/75 123/82  Pulse: 57 88 79 78  Temp:      TempSrc:      Resp: 20  20 20   Height:      Weight:      SpO2:          FHT:  FHR: 120 bpm, variability: moderate,  accelerations:  Present,  decelerations:  Absent UC:   irregular, every 3-5 minutes SVE:   Dilation: 3 Effacement (%): 50 Station: -2 Exam by:: Smith,CNM Pitocin @ 8 mu/min  Labs: Lab Results  Component Value Date   WBC 11.9* 02/23/2014   HGB 12.7 02/23/2014   HCT 35.8* 02/23/2014   MCV 89.9 02/23/2014   PLT 215 02/23/2014    Assessment / Plan: Induction of labor due to gestational hypertension,  progressing well on pitocin  Labor: Progressing normally Fetal Wellbeing:  Category I Pain Control:  Labor support without medications  Plan epidural Anticipated MOD:  NSVD  CRESENZO-DISHMAN,Mallie Linnemann 02/23/2014, 8:41 PM

## 2014-02-23 NOTE — Patient Instructions (Signed)
Labor Induction  Labor induction is when steps are taken to cause a pregnant woman to begin the labor process. Most women go into labor on their own between 37 weeks and 42 weeks of the pregnancy. When this does not happen or when there is a medical need, methods may be used to induce labor. Labor induction causes a pregnant woman's uterus to contract. It also causes the cervix to soften (ripen), open (dilate), and thin out (efface). Usually, labor is not induced before 39 weeks of the pregnancy unless there is a problem with the baby or mother.  Before inducing labor, your health care provider will consider a number of factors, including the following:  The medical condition of you and the baby.   How many weeks along you are.   The status of the baby's lung maturity.   The condition of the cervix.   The position of the baby.  WHAT ARE THE REASONS FOR LABOR INDUCTION? Labor may be induced for the following reasons:  The health of the baby or mother is at risk.   The pregnancy is overdue by 1 week or more.   The water breaks but labor does not start on its own.   The mother has a health condition or serious illness, such as high blood pressure, infection, placental abruption, or diabetes.  The amniotic fluid amounts are low around the baby.   The baby is distressed.  Convenience or wanting the baby to be born on a certain date is not a reason for inducing labor. WHAT METHODS ARE USED FOR LABOR INDUCTION? Several methods of labor induction may be used, such as:   Prostaglandin medicine. This medicine causes the cervix to dilate and ripen. The medicine will also start contractions. It can be taken by mouth or by inserting a suppository into the vagina.   Inserting a thin tube (catheter) with a balloon on the end into the vagina to dilate the cervix. Once inserted, the balloon is expanded with water, which causes the cervix to open.   Stripping the membranes. Your health  care provider separates amniotic sac tissue from the cervix, causing the cervix to be stretched and causing the release of a hormone called progesterone. This may cause the uterus to contract. It is often done during an office visit. You will be sent home to wait for the contractions to begin. You will then come in for an induction.   Breaking the water. Your health care provider makes a hole in the amniotic sac using a small instrument. Once the amniotic sac breaks, contractions should begin. This may still take hours to see an effect.   Medicine to trigger or strengthen contractions. This medicine is given through an IV access tube inserted into a vein in your arm.  All of the methods of induction, besides stripping the membranes, will be done in the hospital. Induction is done in the hospital so that you and the baby can be carefully monitored.  HOW LONG DOES IT TAKE FOR LABOR TO BE INDUCED? Some inductions can take up to 2-3 days. Depending on the cervix, it usually takes less time. It takes longer when you are induced early in the pregnancy or if this is your first pregnancy. If a mother is still pregnant and the induction has been going on for 2-3 days, either the mother will be sent home or a cesarean delivery will be needed. WHAT ARE THE RISKS ASSOCIATED WITH LABOR INDUCTION? Some of the risks of induction   include:   Changes in fetal heart rate, such as too high, too low, or erratic.   Fetal distress.   Chance of infection for the mother and baby.   Increased chance of having a cesarean delivery.   Breaking off (abruption) of the placenta from the uterus (rare).   Uterine rupture (very rare).  When induction is needed for medical reasons, the benefits of induction may outweigh the risks. WHAT ARE SOME REASONS FOR NOT INDUCING LABOR? Labor induction should not be done if:   It is shown that your baby does not tolerate labor.   You have had previous surgeries on your  uterus, such as a myomectomy or the removal of fibroids.   Your placenta lies very low in the uterus and blocks the opening of the cervix (placenta previa).   Your baby is not in a head-down position.   The umbilical cord drops down into the birth canal in front of the baby. This could cut off the baby's blood and oxygen supply.   You have had a previous cesarean delivery.   There are unusual circumstances, such as the baby being extremely premature.  Document Released: 09/10/2006 Document Revised: 12/22/2012 Document Reviewed: 11/18/2012 [employer] Patient Information 2015 [employer]. This information is not intended to replace advice given to you by your health care provider. Make sure you discuss any questions you have with your health care provider.  

## 2014-02-23 NOTE — Progress Notes (Signed)
NST reactive today. Low Nl AFI 3 days ago nl doppler and 8/8 BPP. IOL 39 weeks 03/07/14, BPP in 4 days. To MAu evaluate possible pre E

## 2014-02-23 NOTE — Progress Notes (Signed)
NST/OBF Pt has been experiencing strong contractions, desires cervical exam

## 2014-02-23 NOTE — H&P (Signed)
Attestation of Attending Supervision of Advanced Practitioner (PA/CNM/NP): Evaluation and management procedures were performed by the Advanced Practitioner under my supervision and collaboration.  I have reviewed the Advanced Practitioner's note and chart, and I agree with the management and plan.  Slayde Brault, MD, FACOG Attending Obstetrician & Gynecologist Faculty Practice, [facility]   

## 2014-02-23 NOTE — Anesthesia Preprocedure Evaluation (Signed)
Anesthesia Evaluation  Patient identified by MRN, date of birth, ID band Patient awake    Reviewed: Allergy & Precautions, H&P , NPO status , Patient's Chart, lab work & pertinent test results, reviewed documented beta blocker date and time   History of Anesthesia Complications Negative for: history of anesthetic complications  Airway Mallampati: III TM Distance: >3 FB Neck ROM: full    Dental  (+) Teeth Intact   Pulmonary former smoker,  breath sounds clear to auscultation        Cardiovascular hypertension (GHTN), Rhythm:regular Rate:Normal     Neuro/Psych negative neurological ROS  negative psych ROS   GI/Hepatic negative GI ROS, Neg liver ROS,   Endo/Other  negative endocrine ROS  Renal/GU negative Renal ROS     Musculoskeletal   Abdominal   Peds  Hematology negative hematology ROS (+)   Anesthesia Other Findings   Reproductive/Obstetrics (+) Pregnancy                           Anesthesia Physical Anesthesia Plan  ASA: II  Anesthesia Plan: Epidural   Post-op Pain Management:    Induction:   Airway Management Planned:   Additional Equipment:   Intra-op Plan:   Post-operative Plan:   Informed Consent: I have reviewed the patients History and Physical, chart, labs and discussed the procedure including the risks, benefits and alternatives for the proposed anesthesia with the patient or authorized representative who has indicated his/her understanding and acceptance.     Plan Discussed with:   Anesthesia Plan Comments:         Anesthesia Quick Evaluation

## 2014-02-24 ENCOUNTER — Encounter (HOSPITAL_COMMUNITY): Payer: Self-pay | Admitting: *Deleted

## 2014-02-24 DIAGNOSIS — O1490 Unspecified pre-eclampsia, unspecified trimester: Secondary | ICD-10-CM

## 2014-02-24 LAB — RPR

## 2014-02-24 MED ORDER — AMLODIPINE BESYLATE 10 MG PO TABS
10.0000 mg | ORAL_TABLET | Freq: Every day | ORAL | Status: DC
  Administered 2014-02-24 – 2014-02-25 (×2): 10 mg via ORAL
  Filled 2014-02-24 (×3): qty 1

## 2014-02-24 MED ORDER — WITCH HAZEL-GLYCERIN EX PADS: 1.0000 "application " | MEDICATED_PAD | CUTANEOUS | Status: DC | PRN

## 2014-02-24 MED ORDER — TETANUS-DIPHTH-ACELL PERTUSSIS 5-2.5-18.5 LF-MCG/0.5 IM SUSP: 0.5000 mL | Freq: Once | INTRAMUSCULAR | Status: DC

## 2014-02-24 MED ORDER — OXYCODONE-ACETAMINOPHEN 5-325 MG PO TABS
1.0000 | ORAL_TABLET | ORAL | Status: DC | PRN
  Administered 2014-02-24 – 2014-02-25 (×5): 1 via ORAL
  Filled 2014-02-24 (×5): qty 1

## 2014-02-24 MED ORDER — BENZOCAINE-MENTHOL 20-0.5 % EX AERO: 1.0000 "application " | INHALATION_SPRAY | CUTANEOUS | Status: DC | PRN

## 2014-02-24 MED ORDER — LANOLIN HYDROUS EX OINT: TOPICAL_OINTMENT | CUTANEOUS | Status: DC | PRN

## 2014-02-24 MED ORDER — SIMETHICONE 80 MG PO CHEW: 80.0000 mg | CHEWABLE_TABLET | ORAL | Status: DC | PRN

## 2014-02-24 MED ORDER — DIPHENHYDRAMINE HCL 25 MG PO CAPS: 25.0000 mg | ORAL_CAPSULE | Freq: Four times a day (QID) | ORAL | Status: DC | PRN

## 2014-02-24 MED ORDER — OXYCODONE-ACETAMINOPHEN 5-325 MG PO TABS
2.0000 | ORAL_TABLET | ORAL | Status: DC | PRN
  Administered 2014-02-24: 2 via ORAL
  Filled 2014-02-24: qty 2

## 2014-02-24 MED ORDER — PRENATAL MULTIVITAMIN CH
1.0000 | ORAL_TABLET | Freq: Every day | ORAL | Status: DC
  Administered 2014-02-24 – 2014-02-25 (×2): 1 via ORAL
  Filled 2014-02-24 (×2): qty 1

## 2014-02-24 MED ORDER — MEASLES, MUMPS & RUBELLA VAC ~~LOC~~ INJ
0.5000 mL | INJECTION | Freq: Once | SUBCUTANEOUS | Status: AC
  Administered 2014-02-25: 0.5 mL via SUBCUTANEOUS
  Filled 2014-02-24 (×2): qty 0.5

## 2014-02-24 MED ORDER — IBUPROFEN 600 MG PO TABS
600.0000 mg | ORAL_TABLET | Freq: Four times a day (QID) | ORAL | Status: DC
  Administered 2014-02-24 – 2014-02-25 (×6): 600 mg via ORAL
  Filled 2014-02-24 (×6): qty 1

## 2014-02-24 MED ORDER — DIBUCAINE 1 % RE OINT
1.0000 | TOPICAL_OINTMENT | RECTAL | Status: DC | PRN
Start: 2014-02-24 — End: 2014-02-25

## 2014-02-24 MED ORDER — SENNOSIDES-DOCUSATE SODIUM 8.6-50 MG PO TABS
2.0000 | ORAL_TABLET | ORAL | Status: DC
  Administered 2014-02-24 (×2): 2 via ORAL
  Filled 2014-02-24 (×2): qty 2

## 2014-02-24 MED ORDER — ONDANSETRON HCL 4 MG/2ML IJ SOLN: 4.0000 mg | INTRAMUSCULAR | Status: DC | PRN

## 2014-02-24 MED ORDER — ZOLPIDEM TARTRATE 5 MG PO TABS: 5.0000 mg | ORAL_TABLET | Freq: Every evening | ORAL | Status: DC | PRN

## 2014-02-24 MED ORDER — ONDANSETRON HCL 4 MG PO TABS: 4.0000 mg | ORAL_TABLET | ORAL | Status: DC | PRN

## 2014-02-24 NOTE — Anesthesia Postprocedure Evaluation (Signed)
  Anesthesia Post-op Note  Patient: Paula Gilbert  Procedure(s) Performed: * No procedures listed *  Patient Location: Mother/Baby  Anesthesia Type:Epidural  Level of Consciousness: awake, alert , oriented and patient cooperative  Airway and Oxygen Therapy: Patient Spontanous Breathing  Post-op Pain: mild  Post-op Assessment: Post-op Vital signs reviewed, Patient's Cardiovascular Status Stable, Respiratory Function Stable, Patent Airway, No headache, No backache, No residual numbness and No residual motor weakness  Post-op Vital Signs: Reviewed and stable  Last Vitals:  Filed Vitals:   02/24/14 0642  BP: 128/88  Pulse: 90  Temp: 36.8 C  Resp: 18    Complications: No apparent anesthesia complications

## 2014-02-24 NOTE — Progress Notes (Signed)
UR completed 

## 2014-02-24 NOTE — Lactation Note (Addendum)
This note was copied from the chart of [place]. Lactation Consultation Note  Patient Name: Paula Gilbert RAXEN'M Date: 02/24/2014 Reason for consult: Initial assessment Baby 17 hours of life. Mom has hospital DEBP in room but so far has not used it. Reviewed cleaning parts and set up washing area. Mom is wanting to try to primarily BF now, but formula feed as well because returning to work. Discussed the option of pumping and bottle-feeding EBM when she returns to work. Assisted mom to begin pumping breast. Mom's colostrum is flowing while pumping. Enc mom to offer baby lots of STS, offer breast with cues and at least every 3 hours. Discussed cluster-feeding. Enc mom to offer breast at each feeding, then supplement according to supplementation guidelines with EBM and formula as needed. Enc mom to limit feeding to a total of 30 minutes given that baby is small and tires easily. Enc mom to post-pump after nursing and use EBM to supplement at next feeding after baby nurses. Mom is an experienced BF of 2 older girls. Explained that her milk will probably come in earlier than before and she will probably have an increased volume. Enc mom to ask for assistance with latching baby and supplementing as needed. Discussed assessment, interventions, and BF plan with patient's MBU RN Paula Gilbert.  Maternal Data    Feeding Feeding Type:  (Mom had given a bottle before LC came. ) Length of feed: 30 min  LATCH Score/Interventions                      Lactation Tools Discussed/Used     Consult Status Consult Status: Follow-up Date: 02/25/14 Follow-up type: In-patient    Paula Gilbert 02/24/2014, 4:58 PM

## 2014-02-24 NOTE — Progress Notes (Signed)
Post Partum Day 1 Subjective: Feeling well, doing well with the baby no complaints, up ad lib, voiding, tolerating PO and denies passing gas thus far.  Objective: Blood pressure 128/88, pulse 90, temperature 98.3 F (36.8 C), temperature source Oral, resp. rate 18, height 5\' 7"  (1.702 m), weight 153 lb 3.2 oz (69.491 kg), last menstrual period 05/23/2013, SpO2 100.00%, unknown if currently breastfeeding.  Physical Exam:  General: alert, cooperative and no distress Lochia: appropriate Uterine Fundus: firm Incision: N/A DVT Evaluation: No evidence of DVT seen on physical exam. Negative Homan's sign. No cords or calf tenderness. No significant calf/ankle edema.   Recent Labs  02/23/14 1205 02/23/14 1728  HGB 12.9 12.7  HCT 36.8 35.8*    Assessment/Plan: Plan for discharge tomorrow and Contraception Nexplanon   LOS: 1 day   ADAMS,SHNIQUAL SHWON 02/24/2014, 7:34 AM   I have seen and examined this patient and agree the above assessment. CRESENZO-DISHMAN,Analeese [place]ta 03/01/2014 11:08 AM

## 2014-02-24 NOTE — Progress Notes (Signed)
MD notified of pt's bp at 1900; received order to check bp q 4 during the night.  Report given to night shift RN.  [place] Cellar RN

## 2014-02-24 NOTE — Progress Notes (Signed)
MD Lacinda Axon Notified of patient's elevated BP's; 155/112 and 145/109.  Instructed to recheck in an hour (Percocet has been given for pain), and call back with results.  Will continue to monitor.  [place] Cellar RN

## 2014-02-25 MED ORDER — IBUPROFEN 600 MG PO TABS
600.0000 mg | ORAL_TABLET | Freq: Four times a day (QID) | ORAL | Status: DC | PRN
Start: 2014-02-25 — End: 2015-05-23

## 2014-02-25 NOTE — Lactation Note (Signed)
This note was copied from the chart of [place]. Lactation Consultation Note  Baby is nursing well but mom is not pumping.  Instructed mom to post pump every three hours and give expressed milk to baby.  Breasts are filling.  Encouraged to call with concerns prn.  Patient Name: Paula Gilbert [id number] Date: 02/25/2014     Maternal Data    Feeding Feeding Type: Breast Fed Length of feed: 5 min  LATCH Score/Interventions                      Lactation Tools Discussed/Used     Consult Status      Ave Filter 02/25/2014, 3:52 PM

## 2014-02-25 NOTE — Discharge Summary (Signed)
Obstetric Discharge Summary Reason for Admission: induction of labor d/t GHTN Prenatal Procedures: NST and ultrasound Intrapartum Procedures: spontaneous vaginal delivery and GBS prophylaxis Postpartum Procedures: none Complications-Operative and Postpartum: none Eating, drinking, voiding, ambulating well.  +flatus.  Lochia and pain wnl.  Denies dizziness, lightheadedness, or sob. No complaints. Denies ha, scotomata, ruq/epigastric pain, n/v.    Hospital Course: Paula Gilbert is a 23 y.o. [phone] female admited at 37wks for IOL d/t GHTN. She was started on pitocin and progressed quickly to SVB w/o complications. Her pp course has been complicated by HTN requiring norvasc 10mg  daily for control.  By PPD#2 she is doing well and is deemed to have received the full benefit of her hospital stay.  Filed Vitals:   02/25/14 0623  BP: 120/88  Pulse: 85  Temp: 98.2 F (36.8 C)  Resp:    H/H: Lab Results  Component Value Date/Time   HGB 12.7 02/23/2014  5:28 PM   HCT 35.8* 02/23/2014  5:28 PM    Physical Exam: General: alert, cooperative and no distress Abdomen/Uterine Fundus: Appropriately tender, non-distended, FF @ U-2 Incision: n/a Lochia: appropriate Extremities: No evidence of DVT seen on physical exam. Negative Homan's sign, no cords, calf tenderness, or significant calf/ankle edema   Discharge Diagnoses: Term Pregnancy-delivered and after IOL for GHTN  Discharge Information: Date: 11/14/2010 Activity: pelvic rest Diet: routine  Medications: PNV, Ibuprofen and Norvasc 10mg  daily Breast feeding: Yes Contraception: plans for nexplanon Circumcision: outpatient Condition: stable Instructions: refer to handout Discharge to: home  Infant: Home with mother  Follow-up Information   Schedule an appointment as soon as possible for a visit with [facility]. (4-6 weeks for your postpartum visit and nexplanon placement)    Specialty:  Obstetrics and Gynecology   Contact information:   [place] [address] [phone]      Tawnya Crook, CNM, WHNP-BC 02/25/2014,10:00 AM

## 2014-02-25 NOTE — Discharge Instructions (Signed)
Call the clinic or go to [facility] hospital for these signs of pre-eclampsia:  Severe headache that does not go away with Tylenol  Visual changes- seeing spots, double, blurred vision  Pain under your right breast or upper abdomen that does not go away with Tums or heartburn medicine  Nausea and/or vomiting  Severe swelling in your hands, feet, and face     Postpartum Care After Vaginal Delivery After you deliver your newborn (postpartum period), the usual stay in the hospital is 24-72 hours. If there were problems with your labor or delivery, or if you have other medical problems, you might be in the hospital longer.  While you are in the hospital, you will receive help and instructions on how to care for yourself and your newborn during the postpartum period.  While you are in the hospital:  Be sure to tell your nurses if you have pain or discomfort, as well as where you feel the pain and what makes the pain worse.  If you had an incision made near your vagina (episiotomy) or if you had some tearing during delivery, the nurses may put ice packs on your episiotomy or tear. The ice packs may help to reduce the pain and swelling.  If you are breastfeeding, you may feel uncomfortable contractions of your uterus for a couple of weeks. This is normal. The contractions help your uterus get back to normal size.  It is normal to have some bleeding after delivery.  For the first 1-3 days after delivery, the flow is red and the amount may be similar to a period.  It is common for the flow to start and stop.  In the first few days, you may pass some small clots. Let your nurses know if you begin to pass large clots or your flow increases.  Do not  flush blood clots down the toilet before having the nurse look at them.  During the next 3-10 days after delivery, your flow should become more watery and pink or brown-tinged in color.  Ten to fourteen days after delivery, your flow should be a  small amount of yellowish-white discharge.  The amount of your flow will decrease over the first few weeks after delivery. Your flow may stop in 6-8 weeks. Most women have had their flow stop by 12 weeks after delivery.  You should change your sanitary pads frequently.  Wash your hands thoroughly with soap and water for at least 20 seconds after changing pads, using the toilet, or before holding or feeding your newborn.  You should feel like you need to empty your bladder within the first 6-8 hours after delivery.  In case you become weak, lightheaded, or faint, call your nurse before you get out of bed for the first time and before you take a shower for the first time.  Within the first few days after delivery, your breasts may begin to feel tender and full. This is called engorgement. Breast tenderness usually goes away within 48-72 hours after engorgement occurs. You may also notice milk leaking from your breasts. If you are not breastfeeding, do not stimulate your breasts. Breast stimulation can make your breasts produce more milk.  Spending as much time as possible with your newborn is very important. During this time, you and your newborn can feel close and get to know each other. Having your newborn stay in your room (rooming in) will help to strengthen the bond with your newborn. It will give you time to get  to know your newborn and become comfortable caring for your newborn.  Your hormones change after delivery. Sometimes the hormone changes can temporarily cause you to feel sad or tearful. These feelings should not last more than a few days. If these feelings last longer than that, you should talk to your caregiver.  If desired, talk to your caregiver about methods of family planning or contraception.  Talk to your caregiver about immunizations. Your caregiver may want you to have the following immunizations before leaving the hospital:  Tetanus, diphtheria, and pertussis (Tdap) or  tetanus and diphtheria (Td) immunization. It is very important that you and your family (including grandparents) or others caring for your newborn are up-to-date with the Tdap or Td immunizations. The Tdap or Td immunization can help protect your newborn from getting ill.  Rubella immunization.  Varicella (chickenpox) immunization.  Influenza immunization. You should receive this annual immunization if you did not receive the immunization during your pregnancy. Document Released: 02/16/2007 Document Revised: 01/14/2012 Document Reviewed: 12/17/2011 [facility] Patient Information 2015 [place], [place]. This information is not intended to replace advice given to you by your health care provider. Make sure you discuss any questions you have with your health care provider.  Breastfeeding Deciding to breastfeed is one of the best choices you can make for you and your baby. A change in hormones during pregnancy causes your breast tissue to grow and increases the number and size of your milk ducts. These hormones also allow proteins, sugars, and fats from your blood supply to make breast milk in your milk-producing glands. Hormones prevent breast milk from being released before your baby is born as well as prompt milk flow after birth. Once breastfeeding has begun, thoughts of your baby, as well as his or her sucking or crying, can stimulate the release of milk from your milk-producing glands.  BENEFITS OF BREASTFEEDING For Your Baby  Your first milk (colostrum) helps your baby's digestive system function better.   There are antibodies in your milk that help your baby fight off infections.   Your baby has a lower incidence of asthma, allergies, and sudden infant death syndrome.   The nutrients in breast milk are better for your baby than infant formulas and are designed uniquely for your baby's needs.   Breast milk improves your baby's brain development.   Your baby is less likely to develop  other conditions, such as childhood obesity, asthma, or type 2 diabetes mellitus.  For You   Breastfeeding helps to create a very special bond between you and your baby.   Breastfeeding is convenient. Breast milk is always available at the correct temperature and costs nothing.   Breastfeeding helps to burn calories and helps you lose the weight gained during pregnancy.   Breastfeeding makes your uterus contract to its prepregnancy size faster and slows bleeding (lochia) after you give birth.   Breastfeeding helps to lower your risk of developing type 2 diabetes mellitus, osteoporosis, and breast or ovarian cancer later in life. SIGNS THAT YOUR BABY IS HUNGRY Early Signs of Hunger  Increased alertness or activity.  Stretching.  Movement of the head from side to side.  Movement of the head and opening of the mouth when the corner of the mouth or cheek is stroked (rooting).  Increased sucking sounds, smacking lips, cooing, sighing, or squeaking.  Hand-to-mouth movements.  Increased sucking of fingers or hands. Late Signs of Hunger  Fussing.  Intermittent crying. Extreme Signs of Hunger Signs of extreme hunger will  require calming and consoling before your baby will be able to breastfeed successfully. Do not wait for the following signs of extreme hunger to occur before you initiate breastfeeding:   Restlessness.  A loud, strong cry.   Screaming. BREASTFEEDING BASICS Breastfeeding Initiation  Find a comfortable place to sit or lie down, with your neck and back well supported.  Place a pillow or rolled up blanket under your baby to bring him or her to the level of your breast (if you are seated). Nursing pillows are specially designed to help support your arms and your baby while you breastfeed.  Make sure that your baby's abdomen is facing your abdomen.   Gently massage your breast. With your fingertips, massage from your chest wall toward your nipple in a  circular motion. This encourages milk flow. You may need to continue this action during the feeding if your milk flows slowly.  Support your breast with 4 fingers underneath and your thumb above your nipple. Make sure your fingers are well away from your nipple and your baby's mouth.   Stroke your baby's lips gently with your finger or nipple.   When your baby's mouth is open wide enough, quickly bring your baby to your breast, placing your entire nipple and as much of the colored area around your nipple (areola) as possible into your baby's mouth.   More areola should be visible above your baby's upper lip than below the lower lip.   Your baby's tongue should be between his or her lower gum and your breast.   Ensure that your baby's mouth is correctly positioned around your nipple (latched). Your baby's lips should create a seal on your breast and be turned out (everted).  It is common for your baby to suck about 2-3 minutes in order to start the flow of breast milk. Latching Teaching your baby how to latch on to your breast properly is very important. An improper latch can cause nipple pain and decreased milk supply for you and poor weight gain in your baby. Also, if your baby is not latched onto your nipple properly, he or she may swallow some air during feeding. This can make your baby fussy. Burping your baby when you switch breasts during the feeding can help to get rid of the air. However, teaching your baby to latch on properly is still the best way to prevent fussiness from swallowing air while breastfeeding. Signs that your baby has successfully latched on to your nipple:    Silent tugging or silent sucking, without causing you pain.   Swallowing heard between every 3-4 sucks.    Muscle movement above and in front of his or her ears while sucking.  Signs that your baby has not successfully latched on to nipple:   Sucking sounds or smacking sounds from your baby while  breastfeeding.  Nipple pain. If you think your baby has not latched on correctly, slip your finger into the corner of your baby's mouth to break the suction and place it between your baby's gums. Attempt breastfeeding initiation again. Signs of Successful Breastfeeding Signs from your baby:   A gradual decrease in the number of sucks or complete cessation of sucking.   Falling asleep.   Relaxation of his or her body.   Retention of a small amount of milk in his or her mouth.   Letting go of your breast by himself or herself. Signs from you:  Breasts that have increased in firmness, weight, and size 1-3  hours after feeding.   Breasts that are softer immediately after breastfeeding.  Increased milk volume, as well as a change in milk consistency and color by the fifth day of breastfeeding.   Nipples that are not sore, cracked, or bleeding. Signs That Your [employer] is Getting Enough Milk  Wetting at least 3 diapers in a 24-hour period. The urine should be clear and pale yellow by age 13 days.  At least 3 stools in a 24-hour period by age 13 days. The stool should be soft and yellow.  At least 3 stools in a 24-hour period by age 53 days. The stool should be seedy and yellow.  No loss of weight greater than 10% of birth weight during the first 17 days of age.  Average weight gain of 4-7 ounces (113-198 g) per week after age 53 days.  Consistent daily weight gain by age 63 days, without weight loss after the age of 2 weeks. After a feeding, your baby may spit up a small amount. This is common. BREASTFEEDING FREQUENCY AND DURATION Frequent feeding will help you make more milk and can prevent sore nipples and breast engorgement. Breastfeed when you feel the need to reduce the fullness of your breasts or when your baby shows signs of hunger. This is called "breastfeeding on demand." Avoid introducing a pacifier to your baby while you are working to establish breastfeeding (the first 4-6  weeks after your baby is born). After this time you may choose to use a pacifier. Research has shown that pacifier use during the first year of a baby's life decreases the risk of sudden infant death syndrome (SIDS). Allow your baby to feed on each breast as long as he or she wants. Breastfeed until your baby is finished feeding. When your baby unlatches or falls asleep while feeding from the first breast, offer the second breast. Because newborns are often sleepy in the first few weeks of life, you may need to awaken your baby to get him or her to feed. Breastfeeding times will vary from baby to baby. However, the following rules can serve as a guide to help you ensure that your baby is properly fed:  Newborns (babies 74 weeks of age or younger) may breastfeed every 1-3 hours.  Newborns should not go longer than 3 hours during the day or 5 hours during the night without breastfeeding.  You should breastfeed your baby a minimum of 8 times in a 24-hour period until you begin to introduce solid foods to your baby at around 66 months of age. BREAST MILK PUMPING Pumping and storing breast milk allows you to ensure that your baby is exclusively fed your breast milk, even at times when you are unable to breastfeed. This is especially important if you are going back to work while you are still breastfeeding or when you are not able to be present during feedings. Your lactation consultant can give you guidelines on how long it is safe to store breast milk.  A breast pump is a machine that allows you to pump milk from your breast into a sterile bottle. The pumped breast milk can then be stored in a refrigerator or freezer. Some breast pumps are operated by hand, while others use electricity. Ask your lactation consultant which type will work best for you. Breast pumps can be purchased, but some hospitals and breastfeeding support groups lease breast pumps on a monthly basis. A lactation consultant can teach you how  to hand express breast milk, if  you prefer not to use a pump.  CARING FOR YOUR BREASTS WHILE YOU BREASTFEED Nipples can become dry, cracked, and sore while breastfeeding. The following recommendations can help keep your breasts moisturized and healthy:  Avoid using soap on your nipples.   Wear a supportive bra. Although not required, special nursing bras and tank tops are designed to allow access to your breasts for breastfeeding without taking off your entire bra or top. Avoid wearing underwire-style bras or extremely tight bras.  Air dry your nipples for 3-73minutes after each feeding.   Use only cotton bra pads to absorb leaked breast milk. Leaking of breast milk between feedings is normal.   Use lanolin on your nipples after breastfeeding. Lanolin helps to maintain your skin's normal moisture barrier. If you use pure lanolin, you do not need to wash it off before feeding your baby again. Pure lanolin is not toxic to your baby. You may also hand express a few drops of breast milk and gently massage that milk into your nipples and allow the milk to air dry. In the first few weeks after giving birth, some women experience extremely full breasts (engorgement). Engorgement can make your breasts feel heavy, warm, and tender to the touch. Engorgement peaks within 3-5 days after you give birth. The following recommendations can help ease engorgement:  Completely empty your breasts while breastfeeding or pumping. You may want to start by applying warm, moist heat (in the shower or with warm water-soaked hand towels) just before feeding or pumping. This increases circulation and helps the milk flow. If your baby does not completely empty your breasts while breastfeeding, pump any extra milk after he or she is finished.  Wear a snug bra (nursing or regular) or tank top for 1-2 days to signal your body to slightly decrease milk production.  Apply ice packs to your breasts, unless this is too  uncomfortable for you.  Make sure that your baby is latched on and positioned properly while breastfeeding. If engorgement persists after 48 hours of following these recommendations, contact your health care provider or a Science writer. OVERALL HEALTH CARE RECOMMENDATIONS WHILE BREASTFEEDING  Eat healthy foods. Alternate between meals and snacks, eating 3 of each per day. Because what you eat affects your breast milk, some of the foods may make your baby more irritable than usual. Avoid eating these foods if you are sure that they are negatively affecting your baby.  Drink milk, fruit juice, and water to satisfy your thirst (about 10 glasses a day).   Rest often, relax, and continue to take your prenatal vitamins to prevent fatigue, stress, and anemia.  Continue breast self-awareness checks.  Avoid chewing and smoking tobacco.  Avoid alcohol and drug use. Some medicines that may be harmful to your baby can pass through breast milk. It is important to ask your health care provider before taking any medicine, including all over-the-counter and prescription medicine as well as vitamin and herbal supplements. It is possible to become pregnant while breastfeeding. If birth control is desired, ask your health care provider about options that will be safe for your baby. SEEK MEDICAL CARE IF:   You feel like you want to stop breastfeeding or have become frustrated with breastfeeding.  You have painful breasts or nipples.  Your nipples are cracked or bleeding.  Your breasts are red, tender, or warm.  You have a swollen area on either breast.  You have a fever or chills.  You have nausea or vomiting.  You  have drainage other than breast milk from your nipples.  Your breasts do not become full before feedings by the fifth day after you give birth.  You feel sad and depressed.  Your baby is too sleepy to eat well.  Your baby is having trouble sleeping.   Your baby is wetting  less than 3 diapers in a 24-hour period.  Your baby has less than 3 stools in a 24-hour period.  Your baby's skin or the white part of his or her eyes becomes yellow.   Your baby is not gaining weight by 36 days of age. SEEK IMMEDIATE MEDICAL CARE IF:   Your baby is overly tired (lethargic) and does not want to wake up and feed.  Your baby develops an unexplained fever. Document Released: 04/21/2005 Document Revised: 04/26/2013 Document Reviewed: 10/13/2012 [facility] Patient Information 2015 [place], [place]. This information is not intended to replace advice given to you by your health care provider. Make sure you discuss any questions you have with your health care provider.

## 2014-02-26 ENCOUNTER — Ambulatory Visit: Payer: Self-pay

## 2014-02-26 NOTE — Lactation Note (Signed)
This note was copied from the chart of [name]. Lactation Consultation Note  Mom's breasts slightly engorged and full.  Mom pumping inconsistently but baby is nursing often.  Observed baby latch easily and nurse actively.  Instructed to breastfeed frequently today using good breast massage and compression.  Manual pump given with instructions on use, cleaning and EBM storage.  Instructed to pump for comfort when needed.  Ice packs given with instructions for engorgement.  Patient Name: Paula Gilbert VELFY'B Date: 02/26/2014 Reason for consult: Follow-up assessment;Infant < 6lbs   Maternal Data    Feeding Feeding Type: Breast Fed Length of feed: 20 min  LATCH Score/Interventions Latch: Grasps breast easily, tongue down, lips flanged, rhythmical sucking.  Audible Swallowing: Spontaneous and intermittent  Type of Nipple: Everted at rest and after stimulation  Comfort (Breast/Nipple): Filling, red/small blisters or bruises, mild/mod discomfort     Hold (Positioning): No assistance needed to correctly position infant at breast. Intervention(s): Breastfeeding basics reviewed;Support Pillows;Position options;Skin to skin  LATCH Score: 9  Lactation Tools Discussed/Used     Consult Status      Ave Filter 02/26/2014, 9:42 AM

## 2014-02-27 ENCOUNTER — Ambulatory Visit (HOSPITAL_COMMUNITY): Payer: Medicaid Other

## 2014-02-27 ENCOUNTER — Other Ambulatory Visit (HOSPITAL_COMMUNITY): Payer: Medicaid Other

## 2014-03-02 ENCOUNTER — Other Ambulatory Visit: Payer: Medicaid Other

## 2014-03-06 ENCOUNTER — Encounter (HOSPITAL_COMMUNITY): Payer: Self-pay | Admitting: *Deleted

## 2014-03-06 ENCOUNTER — Other Ambulatory Visit (HOSPITAL_COMMUNITY): Payer: Medicaid Other

## 2014-03-06 ENCOUNTER — Ambulatory Visit (HOSPITAL_COMMUNITY): Payer: Medicaid Other

## 2014-03-07 ENCOUNTER — Encounter: Payer: Self-pay | Admitting: *Deleted

## 2014-03-07 ENCOUNTER — Inpatient Hospital Stay (HOSPITAL_COMMUNITY): Payer: Medicaid Other

## 2014-03-07 ENCOUNTER — Telehealth: Payer: Self-pay | Admitting: *Deleted

## 2014-03-07 NOTE — Telephone Encounter (Addendum)
Pt left message stating that she needs a letter for Social Services stating that she was taken out of work and had a high risk pregnancy. I returned the call to pt and discussed her request. I advised pt that I did not see any documentation that she was taken out of work until she was admitted for IOL on 10/22. Pt then said that her job took her out of work because of the restrictions which she was given (see letter dated 9/17) and Social Services needs to know that she had a high risk pregnancy and required frequent clinic visits.  I stated to pt that she began twice weekly visits on 10/8 and I can put that in the letter. I advised that she will need to come to clinic and sign ROI. Then we can give her a letter for SS. Pt voiced understanding and agreed.  Letter composed and is ready to give to pt when she arrives.

## 2014-04-12 ENCOUNTER — Encounter: Payer: Self-pay | Admitting: Obstetrics & Gynecology

## 2014-04-12 ENCOUNTER — Ambulatory Visit (INDEPENDENT_AMBULATORY_CARE_PROVIDER_SITE_OTHER): Payer: Medicaid Other | Admitting: Obstetrics & Gynecology

## 2014-04-12 DIAGNOSIS — Z3042 Encounter for surveillance of injectable contraceptive: Secondary | ICD-10-CM

## 2014-04-12 DIAGNOSIS — Z3049 Encounter for surveillance of other contraceptives: Secondary | ICD-10-CM

## 2014-04-12 MED ORDER — ETONOGESTREL 68 MG ~~LOC~~ IMPL
68.0000 mg | DRUG_IMPLANT | Freq: Once | SUBCUTANEOUS | Status: AC
  Administered 2014-04-12: 68 mg via SUBCUTANEOUS

## 2014-04-12 NOTE — Progress Notes (Signed)
Patient ID: Paula Gilbert, female   DOB: [birth year], 23 y.o.   MRN: [id number] Pt stated that she has had unprotected sex within the two week perio--Patient would like to wait and plans to come back in two weeks to get the Nexplanon.

## 2014-04-12 NOTE — Progress Notes (Signed)
   Subjective:    Patient ID: Paula Gilbert, female    DOB: 25-Oct-1990, 23 y.o.   MRN: [id number]  HPI  23 yo AA P2 here today for her pp visit and placement of a Nexplanon. She had Nexplanon in the past for 3 years and wants another. She reports that her new son is growing well with bottlefeeding. He wakes up about every 3 hours at night. She had unprotected sex "before Thanksgiving". She denies dyspareunia or depressions symptoms. She reports normal bowel and bladder function.  Review of Systems     Objective:   Physical Exam  Her vulva is normal  UPT was negative. Consent was signed. Time out procedure was done. Her left arm was prepped with betadine and infiltrated with 3 cc of 1% lidocaine. After adequate anesthesia was assured, the Nexplanon device was placed according to standard of care. Her arm was hemostatic and was bandaged. She tolerated the procedure well.       Assessment & Plan:  Contraception- Nexplanon PP-doing well RTC 1 year/prn sooner

## 2014-04-21 ENCOUNTER — Encounter: Payer: Self-pay | Admitting: *Deleted

## 2014-09-25 ENCOUNTER — Other Ambulatory Visit (HOSPITAL_COMMUNITY): Payer: Self-pay | Admitting: *Deleted

## 2014-09-25 DIAGNOSIS — N6320 Unspecified lump in the left breast, unspecified quadrant: Secondary | ICD-10-CM

## 2014-10-05 ENCOUNTER — Other Ambulatory Visit: Payer: Medicaid Other

## 2014-10-05 ENCOUNTER — Ambulatory Visit (HOSPITAL_COMMUNITY): Payer: Medicaid Other

## 2014-10-26 ENCOUNTER — Encounter (HOSPITAL_COMMUNITY): Payer: Self-pay

## 2014-10-26 ENCOUNTER — Ambulatory Visit (HOSPITAL_COMMUNITY)
Admission: RE | Admit: 2014-10-26 | Discharge: 2014-10-26 | Disposition: A | Discharge status: Home / Self Care | Payer: Medicaid Other | Source: Ambulatory Visit | Attending: Obstetrics and Gynecology | Admitting: Obstetrics and Gynecology

## 2014-10-26 ENCOUNTER — Ambulatory Visit
Admission: RE | Admit: 2014-10-26 | Discharge: 2014-10-26 | Disposition: A | Discharge status: Home / Self Care | Payer: No Typology Code available for payment source | Source: Ambulatory Visit | Attending: Obstetrics and Gynecology | Admitting: Obstetrics and Gynecology

## 2014-10-26 VITALS — BP 100/62 | Temp 98.3°F | Ht 67.0 in | Wt 118.0 lb

## 2014-10-26 DIAGNOSIS — N6314 Unspecified lump in the right breast, lower inner quadrant: Secondary | ICD-10-CM

## 2014-10-26 DIAGNOSIS — N6320 Unspecified lump in the left breast, unspecified quadrant: Secondary | ICD-10-CM

## 2014-10-26 DIAGNOSIS — N6321 Unspecified lump in the left breast, upper outer quadrant: Secondary | ICD-10-CM

## 2014-10-26 DIAGNOSIS — Z1239 Encounter for other screening for malignant neoplasm of breast: Secondary | ICD-10-CM

## 2014-10-26 NOTE — Progress Notes (Signed)
Patient ID: Paula Gilbert, female   DOB: December 06, 1990, 24 y.o.   MRN: [id number] Smoking cessation discussed with patient. Quitline information given to patient. Also information given to patient about smoking cessation classes offered at the [facility]

## 2014-10-26 NOTE — Progress Notes (Signed)
CLINIC:  [facility]) Clinic  REASON FOR VISIT: Well-woman exam and diagnostic mammogram.    HISTORY OF PRESENT ILLNESS:  Ms. Swango is a 24 y.o. female who presents to the [facility] today for clinical breast exam. No family history of breast cancer. She reports finding a left breast lump, near the nipple, about 2 months ago.  She feels that the lump has increased in size in the past 2 months.  Ms. Friebel states that she has lost quite a bit of weight recently, particularly in her breasts, following a recent pregnancy.  She denies any pain, skin changes, or nipple discharge.  Her last pap smear was in 08/2013 and was negative.  She has no history of abnormal pap smears.   REVIEW OF SYSTEMS:  Left breast per HPI.  Denies any nodularity, pain, nipple inversion/discharge, or skin changes to the right breast.   ALLERGIES: No Known Allergies  CURRENT MEDICATIONS:  Current Outpatient Prescriptions on File Prior to Encounter  Medication Sig Dispense Refill  . ibuprofen (ADVIL,MOTRIN) 600 MG tablet Take 1 tablet (600 mg total) by mouth every 6 (six) hours as needed for mild pain, moderate pain or cramping. (Patient not taking: Reported on 04/12/2014) 30 tablet 0  . Prenatal Vit-Fe Fumarate-FA (PRENATAL VITAMINS PLUS) 27-1 MG TABS Take 1 tablet by mouth daily. (Patient not taking: Reported on 04/12/2014) 30 tablet 12   No current facility-administered medications on file prior to encounter.     PHYSICAL EXAM:  Vitals:  Filed Vitals:   10/26/14 1552  BP: 100/62  Temp: 98.3 F (36.8 C)   General: Well-nourished, well-appearing female in no acute distress.  She is unaccompanied in clinic today.  Rolena Infante, LPN was present during physical exam for this patient.  Breasts: Bilateral breasts exposed and observed with patient standing (arms at side, arms on hips, arms on hips flexed forward, and arms over head).  No gross abnormalities including breast  skin puckering or dimpling noted on observation.  Breasts symmetrical without evidence of skin redness, thickening, or peau d'orange appearance. No nipple retraction or nipple discharge noted bilaterally.   -Right breast: Small, mobile nodule in right breast at 5 o'clock position (subcentimeter).   Likely normal breast tissue.  -Left breast: Similar small, mobile adjacent nodules at 2 o'clock and 4 o'clock in subareolar area.  Again, likely benign breast tissue.   Normal fibrocystic breast changes noted in bilateral breasts. Axillary lymph nodes: No axillary lymphadenopathy bilaterally.   GU: Exam deferred. Pap smear is up-to-date.  ASSESSMENT & PLAN:   1. Breast cancer screening: Ms. Moller has palpable nodules in bilatearl breasts on clinical breast exam.  However, these findings are not concerning for malignancy.  She will receive her diagnostic mammogram today as scheduled.  She will be contacted by the imaging center for results of the mammogram.  She was given instructions and educational materials regarding breast self-awareness. Ms. Emery is aware of this plan and agrees with it.    Ms. Peppel was encouraged to ask questions and all questions were answered to her satisfaction.    Mike Craze, NP [place]  [phone]

## 2014-12-06 ENCOUNTER — Ambulatory Visit: Payer: Self-pay | Admitting: Obstetrics & Gynecology

## 2015-03-08 ENCOUNTER — Ambulatory Visit (INDEPENDENT_AMBULATORY_CARE_PROVIDER_SITE_OTHER): Payer: Medicaid Other | Admitting: Obstetrics & Gynecology

## 2015-03-08 ENCOUNTER — Encounter: Payer: Self-pay | Admitting: Obstetrics & Gynecology

## 2015-03-08 VITALS — BP 119/76 | HR 78 | Ht 67.0 in | Wt 114.9 lb

## 2015-03-08 DIAGNOSIS — Z975 Presence of (intrauterine) contraceptive device: Principal | ICD-10-CM

## 2015-03-08 DIAGNOSIS — Z30011 Encounter for initial prescription of contraceptive pills: Secondary | ICD-10-CM | POA: Diagnosis not present

## 2015-03-08 DIAGNOSIS — N921 Excessive and frequent menstruation with irregular cycle: Secondary | ICD-10-CM | POA: Diagnosis present

## 2015-03-08 DIAGNOSIS — Z3046 Encounter for surveillance of implantable subdermal contraceptive: Secondary | ICD-10-CM

## 2015-03-08 MED ORDER — MEDROXYPROGESTERONE ACETATE 10 MG PO TABS: 20.0000 mg | ORAL_TABLET | Freq: Every day | ORAL | Status: DC

## 2015-03-08 MED ORDER — NORGESTIM-ETH ESTRAD TRIPHASIC 0.18/0.215/0.25 MG-35 MCG PO TABS: 1.0000 | ORAL_TABLET | Freq: Every day | ORAL | Status: DC

## 2015-03-08 NOTE — Progress Notes (Signed)
Patient ID: Paula Gilbert, female   DOB: [birth year], 24 y.o.   MRN: [id number]  Chief Complaint  Patient presents with  . Nexplanon Removal  BTB wants to switch BCM  HPI Paula Gilbert is a 24 y.o. female.  [id number] No LMP recorded. Annoying irregular bleeding and wants nexplanon removed. Discussed DMPA and OCP, wants to have regular menses Irregular   HPI  Past Medical History  Diagnosis Date  . Infection     trich, chlamydia  . SGA (small for gestational age)     Past Surgical History  Procedure Laterality Date  . I&d extremity  02/07/2012    Procedure: IRRIGATION AND DEBRIDEMENT EXTREMITY;  Surgeon: Meredith Pel, MD;  Location: [facility];  Service: Orthopedics;  Laterality: Bilateral;  . Orif tibia fracture  02/07/2012    Procedure: OPEN REDUCTION INTERNAL FIXATION (ORIF) TIBIA FRACTURE;  Surgeon: Meredith Pel, MD;  Location: [facility];  Service: Orthopedics;  Laterality: Right;    Family History  Problem Relation Age of Onset  . Hypertension Paternal Grandfather   . Hypertension Maternal Grandmother   . Diabetes Maternal Grandmother   . Hypertension Mother   . Hypertension Maternal Grandfather   . Diabetes Paternal Grandmother   . Hearing loss Neg Hx     Social History Social History  Substance Use Topics  . Smoking status: Current Every Day Smoker -- 0.50 packs/day for 5 years    Types: Cigarettes    Start date: 03/20/2014  . Smokeless tobacco: Never Used  . Alcohol Use: No    No Known Allergies  Current Outpatient Prescriptions  Medication Sig Dispense Refill  . ibuprofen (ADVIL,MOTRIN) 600 MG tablet Take 1 tablet (600 mg total) by mouth every 6 (six) hours as needed for mild pain, moderate pain or cramping. (Patient not taking: Reported on 04/12/2014) 30 tablet 0  . Norgestimate-Ethinyl Estradiol Triphasic 0.18/0.215/0.25 MG-35 MCG tablet Take 1 tablet by mouth daily. 1 Package 11  . Prenatal Vit-Fe Fumarate-FA (PRENATAL VITAMINS PLUS) 27-1 MG TABS  Take 1 tablet by mouth daily. (Patient not taking: Reported on 04/12/2014) 30 tablet 12   No current facility-administered medications for this visit.    Review of Systems Review of Systems  Constitutional: Negative.   Genitourinary: Positive for vaginal bleeding. Negative for vaginal discharge and pelvic pain.    Blood pressure 119/76, pulse 78, height 5\' 7"  (1.702 m), weight 114 lb 14.4 oz (52.118 kg), not currently breastfeeding.  Physical Exam Physical Exam  Constitutional: She is oriented to person, place, and time. She appears well-developed. No distress.  Neurological: She is alert and oriented to person, place, and time.  Psychiatric: She has a normal mood and affect. Her behavior is normal.  Patient given informed consent for removal of her Nexplanon, time out was performed.  Signed copy in the chart.  Appropriate time out taken. Implanon site identified.  Area prepped in usual sterile fashon. One cc of 1% lidocaine was used to anesthetize the area at the distal end of the implant. A small stab incision was made right beside the implant on the distal portion.  The implanon rod was grasped using hemostats and removed without difficulty.  There was less than 3 cc blood loss. There were no complications.   steri-strip were applied over the small incision.  A pressure bandage was applied to reduce any bruising.  The patient tolerated the procedure well and was given post procedure instructions.  Data Reviewed   Assessment    DUB with Nexplanon  Plan    Ortho Tri-Cyclen rx sent        ARNOLD,JAMES 03/08/2015, 3:53 PM

## 2015-03-08 NOTE — Patient Instructions (Addendum)
Etonogestrel implant What is this medicine? ETONOGESTREL (et oh noe JES trel) is a contraceptive (birth control) device. It is used to prevent pregnancy. It can be used for up to 3 years. This medicine may be used for other purposes; ask your health care provider or pharmacist if you have questions. What should I tell my health care provider before I take this medicine? They need to know if you have any of these conditions: -abnormal vaginal bleeding -blood vessel disease or blood clots -cancer of the breast, cervix, or liver -depression -diabetes -gallbladder disease -headaches -heart disease or recent heart attack -high blood pressure -high cholesterol -kidney disease -liver disease -renal disease -seizures -tobacco smoker -an unusual or allergic reaction to etonogestrel, other hormones, anesthetics or antiseptics, medicines, foods, dyes, or preservatives -pregnant or trying to get pregnant -breast-feeding How should I use this medicine? This device is inserted just under the skin on the inner side of your upper arm by a health care professional. Talk to your pediatrician regarding the use of this medicine in children. Special care may be needed. Overdosage: If you think you have taken too much of this medicine contact a poison control center or emergency room at once. NOTE: This medicine is only for you. Do not share this medicine with others. What if I miss a dose? This does not apply. What may interact with this medicine? Do not take this medicine with any of the following medications: -amprenavir -bosentan -fosamprenavir This medicine may also interact with the following medications: -barbiturate medicines for inducing sleep or treating seizures -certain medicines for fungal infections like ketoconazole and itraconazole -griseofulvin -medicines to treat seizures like carbamazepine, felbamate, oxcarbazepine, phenytoin,  topiramate -modafinil -phenylbutazone -rifampin -some medicines to treat HIV infection like atazanavir, indinavir, lopinavir, nelfinavir, tipranavir, ritonavir -St. John's wort This list may not describe all possible interactions. Give your health care provider a list of all the medicines, herbs, non-prescription drugs, or dietary supplements you use. Also tell them if you smoke, drink alcohol, or use illegal drugs. Some items may interact with your medicine. What should I watch for while using this medicine? This product does not protect you against HIV infection (AIDS) or other sexually transmitted diseases. You should be able to feel the implant by pressing your fingertips over the skin where it was inserted. Contact your doctor if you cannot feel the implant, and use a non-hormonal birth control method (such as condoms) until your doctor confirms that the implant is in place. If you feel that the implant may have broken or become bent while in your arm, contact your healthcare provider. What side effects may I notice from receiving this medicine? Side effects that you should report to your doctor or health care professional as soon as possible: -allergic reactions like skin rash, itching or hives, swelling of the face, lips, or tongue -breast lumps -changes in emotions or moods -depressed mood -heavy or prolonged menstrual bleeding -pain, irritation, swelling, or bruising at the insertion site -scar at site of insertion -signs of infection at the insertion site such as fever, and skin redness, pain or discharge -signs of pregnancy -signs and symptoms of a blood clot such as breathing problems; changes in vision; chest pain; severe, sudden headache; pain, swelling, warmth in the leg; trouble speaking; sudden numbness or weakness of the face, arm or leg -signs and symptoms of liver injury like dark yellow or brown urine; general ill feeling or flu-like symptoms; light-colored stools; loss of  appetite; nausea; right upper belly   pain; unusually weak or tired; yellowing of the eyes or skin -unusual vaginal bleeding, discharge -signs and symptoms of a stroke like changes in vision; confusion; trouble speaking or understanding; severe headaches; sudden numbness or weakness of the face, arm or leg; trouble walking; dizziness; loss of balance or coordination Side effects that usually do not require medical attention (Report these to your doctor or health care professional if they continue or are bothersome.): -acne -back pain -breast pain -changes in weight -dizziness -general ill feeling or flu-like symptoms -headache -irregular menstrual bleeding -nausea -sore throat -vaginal irritation or inflammation This list may not describe all possible side effects. Call your doctor for medical advice about side effects. You may report side effects to FDA at 1-800-FDA-1088. Where should I keep my medicine? This drug is given in a hospital or clinic and will not be stored at home. NOTE: This sheet is a summary. It may not cover all possible information. If you have questions about this medicine, talk to your doctor, pharmacist, or health care provider.    2016, Elsevier/Gold Standard. (2014-02-03 14:07:06) Oral Contraception Use Oral contraceptive pills (OCPs) are medicines taken to prevent pregnancy. OCPs work by preventing the ovaries from releasing eggs. The hormones in OCPs also cause the cervical mucus to thicken, preventing the sperm from entering the uterus. The hormones also cause the uterine lining to become thin, not allowing a fertilized egg to attach to the inside of the uterus. OCPs are highly effective when taken exactly as prescribed. However, OCPs do not prevent sexually transmitted diseases (STDs). Safe sex practices, such as using condoms along with an OCP, can help prevent STDs. Before taking OCPs, you may have a physical exam and Pap test. Your health care provider may also  order blood tests if necessary. Your health care provider will make sure you are a good candidate for oral contraception. Discuss with your health care provider the possible side effects of the OCP you may be prescribed. When starting an OCP, it can take 2 to 3 months for the body to adjust to the changes in hormone levels in your body.  HOW TO TAKE ORAL CONTRACEPTIVE PILLS Your health care provider may advise you on how to start taking the first cycle of OCPs. Otherwise, you can:   Start on day 1 of your menstrual period. You will not need any backup contraceptive protection with this start time.   Start on the first Sunday after your menstrual period or the day you get your prescription. In these cases, you will need to use backup contraceptive protection for the first week.   Start the pill at any time of your cycle. If you take the pill within 5 days of the start of your period, you are protected against pregnancy right away. In this case, you will not need a backup form of birth control. If you start at any other time of your menstrual cycle, you will need to use another form of birth control for 7 days. If your OCP is the type called a minipill, it will protect you from pregnancy after taking it for 2 days (48 hours). After you have started taking OCPs:   If you forget to take 1 pill, take it as soon as you remember. Take the next pill at the regular time.   If you miss 2 or more pills, call your health care provider because different pills have different instructions for missed doses. Use backup birth control until your next menstrual period starts.  If you use a 28-day pack that contains inactive pills and you miss 1 of the last 7 pills (pills with no hormones), it will not matter. Throw away the rest of the non-hormone pills and start a new pill pack.  No matter which day you start the OCP, you will always start a new pack on that same day of the week. Have an extra pack of OCPs and a  backup contraceptive method available in case you miss some pills or lose your OCP pack.  HOME CARE INSTRUCTIONS   Do not smoke.   Always use a condom to protect against STDs. OCPs do not protect against STDs.   Use a calendar to mark your menstrual period days.   Read the information and directions that came with your OCP. Talk to your health care provider if you have questions.  SEEK MEDICAL CARE IF:   You develop nausea and vomiting.   You have abnormal vaginal discharge or bleeding.   You develop a rash.   You miss your menstrual period.   You are losing your hair.   You need treatment for mood swings or depression.   You get dizzy when taking the OCP.   You develop acne from taking the OCP.   You become pregnant.  SEEK IMMEDIATE MEDICAL CARE IF:   You develop chest pain.   You develop shortness of breath.   You have an uncontrolled or severe headache.   You develop numbness or slurred speech.   You develop visual problems.   You develop pain, redness, and swelling in the legs.    This information is not intended to replace advice given to you by your health care provider. Make sure you discuss any questions you have with your health care provider.   Document Released: 04/10/2011 Document Revised: 05/12/2014 Document Reviewed: 10/10/2012 Elsevier Interactive Patient Education [employer].

## 2015-05-23 ENCOUNTER — Encounter (HOSPITAL_COMMUNITY): Payer: Self-pay | Admitting: *Deleted

## 2015-05-23 ENCOUNTER — Inpatient Hospital Stay (HOSPITAL_COMMUNITY)
Admission: AD | Admit: 2015-05-23 | Discharge: 2015-05-23 | Disposition: A | Discharge status: Home / Self Care | Payer: Medicaid Other | Source: Ambulatory Visit | Attending: Obstetrics & Gynecology | Admitting: Obstetrics & Gynecology

## 2015-05-23 DIAGNOSIS — Z3491 Encounter for supervision of normal pregnancy, unspecified, first trimester: Secondary | ICD-10-CM

## 2015-05-23 DIAGNOSIS — N925 Other specified irregular menstruation: Secondary | ICD-10-CM | POA: Insufficient documentation

## 2015-05-23 DIAGNOSIS — Z36 Encounter for antenatal screening of mother: Secondary | ICD-10-CM | POA: Insufficient documentation

## 2015-05-23 DIAGNOSIS — Z3201 Encounter for pregnancy test, result positive: Secondary | ICD-10-CM | POA: Insufficient documentation

## 2015-05-23 DIAGNOSIS — Z3A10 10 weeks gestation of pregnancy: Secondary | ICD-10-CM | POA: Insufficient documentation

## 2015-05-23 DIAGNOSIS — N926 Irregular menstruation, unspecified: Secondary | ICD-10-CM | POA: Diagnosis not present

## 2015-05-23 NOTE — MAU Note (Addendum)
took a home test the week before Christmas, was positive.  Wants to know how far along she is so that she can take actions to get rid of it.  Had Nexplanon removed 11/3. Had irreg bleeding while had nexplanon.  No period since

## 2015-05-23 NOTE — MAU Provider Note (Signed)
S:  Paula Gilbert is a 25 y.o. [phone] at [redacted]w[redacted]d wks here for confirmation of pregnancy. Patient's last menstrual period was 03/08/2015.Marland Kitchen  Denies any vaginal bleeding or abdominal pain.  Plans to get prenatal care at undecided; is unsure how she will proceed with pregnancy.  O:  Past Medical History  Diagnosis Date  . Infection     trich, chlamydia  . SGA (small for gestational age)     Family History  Problem Relation Age of Onset  . Hypertension Paternal Grandfather   . Hypertension Maternal Grandmother   . Diabetes Maternal Grandmother   . Hypertension Mother   . Hypertension Maternal Grandfather   . Diabetes Paternal Grandmother   . Hearing loss Neg Hx      Filed Vitals:   05/23/15 0853  BP: 112/77  Pulse: 87  Temp: 97.8 F (36.6 C)  Resp: 16   General:  A&OX3 with no signs of acute distress. She appears well-developed and well-nourished. No distress.  Neck: Normal range of motion.  Pulmonary/Chest: Effort normal. No respiratory distress.  Musculoskeletal: Normal range of motion.  Neurological: She is alert and oriented to person, place, and time.  Skin: Skin is warm and dry.   A: 1. Missed period   2. Fetal heart tones present, first trimester      P: Explained benefits of taking prenatal vitamins w/folic acid early in pregnancy. Begin prenatal care. Reviewed warning signs of pregnancy.  Jorje Guild, NP

## 2015-05-23 NOTE — Discharge Instructions (Signed)
First Trimester of Pregnancy The first trimester of pregnancy is from week 1 until the end of week 12 (months 1 through 3). A week after a sperm fertilizes an egg, the egg will implant on the wall of the uterus. This embryo will begin to develop into a baby. Genes from you and your partner are forming the baby. The female genes determine whether the baby is a boy or a girl. At 6-8 weeks, the eyes and face are formed, and the heartbeat can be seen on ultrasound. At the end of 12 weeks, all the baby's organs are formed.  Now that you are pregnant, you will want to do everything you can to have a healthy baby. Two of the most important things are to get good prenatal care and to follow your health care provider's instructions. Prenatal care is all the medical care you receive before the baby's birth. This care will help prevent, find, and treat any problems during the pregnancy and childbirth. BODY CHANGES Your body goes through many changes during pregnancy. The changes vary from woman to woman.   You may gain or lose a couple of pounds at first.  You may feel sick to your stomach (nauseous) and throw up (vomit). If the vomiting is uncontrollable, call your health care provider.  You may tire easily.  You may develop headaches that can be relieved by medicines approved by your health care provider.  You may urinate more often. Painful urination may mean you have a bladder infection.  You may develop heartburn as a result of your pregnancy.  You may develop constipation because certain hormones are causing the muscles that push waste through your intestines to slow down.  You may develop hemorrhoids or swollen, bulging veins (varicose veins).  Your breasts may begin to grow larger and become tender. Your nipples may stick out more, and the tissue that surrounds them (areola) may become darker.  Your gums may bleed and may be sensitive to brushing and flossing.  Dark spots or blotches (chloasma,  mask of pregnancy) may develop on your face. This will likely fade after the baby is born.  Your menstrual periods will stop.  You may have a loss of appetite.  You may develop cravings for certain kinds of food.  You may have changes in your emotions from day to day, such as being excited to be pregnant or being concerned that something may go wrong with the pregnancy and baby.  You may have more vivid and strange dreams.  You may have changes in your hair. These can include thickening of your hair, rapid growth, and changes in texture. Some women also have hair loss during or after pregnancy, or hair that feels dry or thin. Your hair will most likely return to normal after your baby is born. WHAT TO EXPECT AT YOUR PRENATAL VISITS During a routine prenatal visit:  You will be weighed to make sure you and the baby are growing normally.  Your blood pressure will be taken.  Your abdomen will be measured to track your baby's growth.  The fetal heartbeat will be listened to starting around week 10 or 12 of your pregnancy.  Test results from any previous visits will be discussed. Your health care provider may ask you:  How you are feeling.  If you are feeling the baby move.  If you have had any abnormal symptoms, such as leaking fluid, bleeding, severe headaches, or abdominal cramping.  If you are using any tobacco products,   including cigarettes, chewing tobacco, and electronic cigarettes.  If you have any questions. Other tests that may be performed during your first trimester include:  Blood tests to find your blood type and to check for the presence of any previous infections. They will also be used to check for low iron levels (anemia) and Rh antibodies. Later in the pregnancy, blood tests for diabetes will be done along with other tests if problems develop.  Urine tests to check for infections, diabetes, or protein in the urine.  An ultrasound to confirm the proper growth  and development of the baby.  An amniocentesis to check for possible genetic problems.  Fetal screens for spina bifida and Down syndrome.  You may need other tests to make sure you and the baby are doing well.  HIV (human immunodeficiency virus) testing. Routine prenatal testing includes screening for HIV, unless you choose not to have this test. HOME CARE INSTRUCTIONS  Medicines  Follow your health care provider's instructions regarding medicine use. Specific medicines may be either safe or unsafe to take during pregnancy.  Take your prenatal vitamins as directed.  If you develop constipation, try taking a stool softener if your health care provider approves. Diet  Eat regular, well-balanced meals. Choose a variety of foods, such as meat or vegetable-based protein, fish, milk and low-fat dairy products, vegetables, fruits, and whole grain breads and cereals. Your health care provider will help you determine the amount of weight gain that is right for you.  Avoid raw meat and uncooked cheese. These carry germs that can cause birth defects in the baby.  Eating four or five small meals rather than three large meals a day may help relieve nausea and vomiting. If you start to feel nauseous, eating a few soda crackers can be helpful. Drinking liquids between meals instead of during meals also seems to help nausea and vomiting.  If you develop constipation, eat more high-fiber foods, such as fresh vegetables or fruit and whole grains. Drink enough fluids to keep your urine clear or pale yellow. Activity and Exercise  Exercise only as directed by your health care provider. Exercising will help you:  Control your weight.  Stay in shape.  Be prepared for labor and delivery.  Experiencing pain or cramping in the lower abdomen or low back is a good sign that you should stop exercising. Check with your health care provider before continuing normal exercises.  Try to avoid standing for long  periods of time. Move your legs often if you must stand in one place for a long time.  Avoid heavy lifting.  Wear low-heeled shoes, and practice good posture.  You may continue to have sex unless your health care provider directs you otherwise. Relief of Pain or Discomfort  Wear a good support bra for breast tenderness.   Take warm sitz baths to soothe any pain or discomfort caused by hemorrhoids. Use hemorrhoid cream if your health care provider approves.   Rest with your legs elevated if you have leg cramps or low back pain.  If you develop varicose veins in your legs, wear support hose. Elevate your feet for 15 minutes, 3-4 times a day. Limit salt in your diet. Prenatal Care  Schedule your prenatal visits by the twelfth week of pregnancy. They are usually scheduled monthly at first, then more often in the last 2 months before delivery.  Write down your questions. Take them to your prenatal visits.  Keep all your prenatal visits as directed by your   health care provider. Safety  Wear your seat belt at all times when driving.  Make a list of emergency phone numbers, including numbers for family, friends, the hospital, and police and fire departments. General Tips  Ask your health care provider for a referral to a local prenatal education class. Begin classes no later than at the beginning of month 6 of your pregnancy.  Ask for help if you have counseling or nutritional needs during pregnancy. Your health care provider can offer advice or refer you to specialists for help with various needs.  Do not use hot tubs, steam rooms, or saunas.  Do not douche or use tampons or scented sanitary pads.  Do not cross your legs for long periods of time.  Avoid cat litter boxes and soil used by cats. These carry germs that can cause birth defects in the baby and possibly loss of the fetus by miscarriage or stillbirth.  Avoid all smoking, herbs, alcohol, and medicines not prescribed by  your health care provider. Chemicals in these affect the formation and growth of the baby.  Do not use any tobacco products, including cigarettes, chewing tobacco, and electronic cigarettes. If you need help quitting, ask your health care provider. You may receive counseling support and other resources to help you quit.  Schedule a dentist appointment. At home, brush your teeth with a soft toothbrush and be gentle when you floss. SEEK MEDICAL CARE IF:   You have dizziness.  You have mild pelvic cramps, pelvic pressure, or nagging pain in the abdominal area.  You have persistent nausea, vomiting, or diarrhea.  You have a bad smelling vaginal discharge.  You have pain with urination.  You notice increased swelling in your face, hands, legs, or ankles. SEEK IMMEDIATE MEDICAL CARE IF:   You have a fever.  You are leaking fluid from your vagina.  You have spotting or bleeding from your vagina.  You have severe abdominal cramping or pain.  You have rapid weight gain or loss.  You vomit blood or material that looks like coffee grounds.  You are exposed to German measles and have never had them.  You are exposed to fifth disease or chickenpox.  You develop a severe headache.  You have shortness of breath.  You have any kind of trauma, such as from a fall or a car accident.   This information is not intended to replace advice given to you by your health care provider. Make sure you discuss any questions you have with your health care provider.   Document Released: 04/15/2001 Document Revised: 05/12/2014 Document Reviewed: 03/01/2013 [employer] Interactive Patient Education 2016 [employer]  

## 2015-11-12 IMAGING — US US OB TRANSVAGINAL
1 series · 14 of 28 positions shown · non-contrast
Comparison: None.

CLINICAL DATA: Bleeding.

EXAM:
OBSTETRIC <14 WK US AND TRANSVAGINAL OB US
TECHNIQUE: Both transabdominal and transvaginal ultrasound examinations were
performed for complete evaluation of the gestation as well as the
maternal uterus, adnexal regions, and pelvic cul-de-sac.
Transvaginal technique was performed to assess early pregnancy.

[Series 1: us ob transvaginal · 14 of 58 slices shown]
[im 3/58]
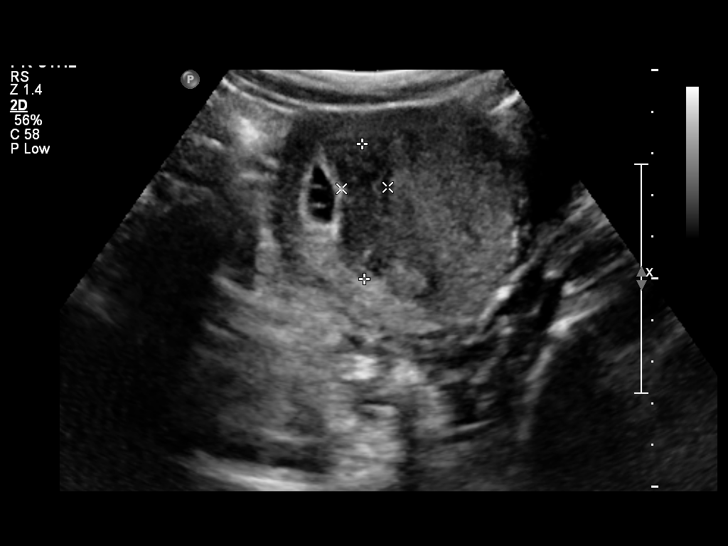
[im 7/58]
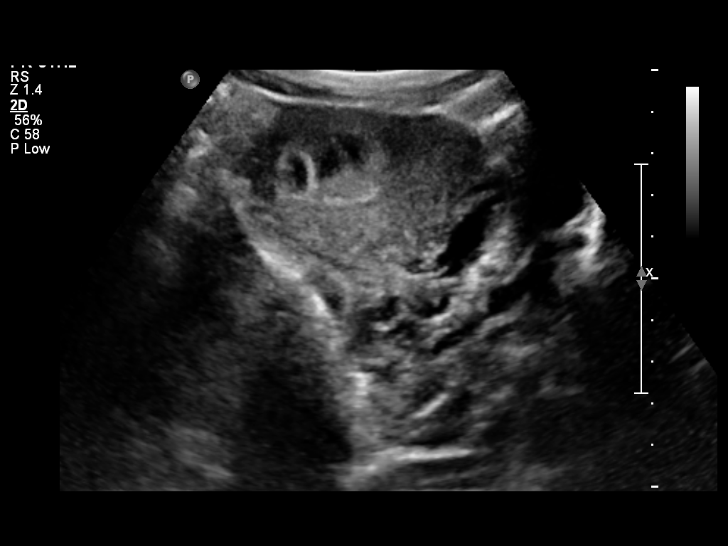
[im 11/58]
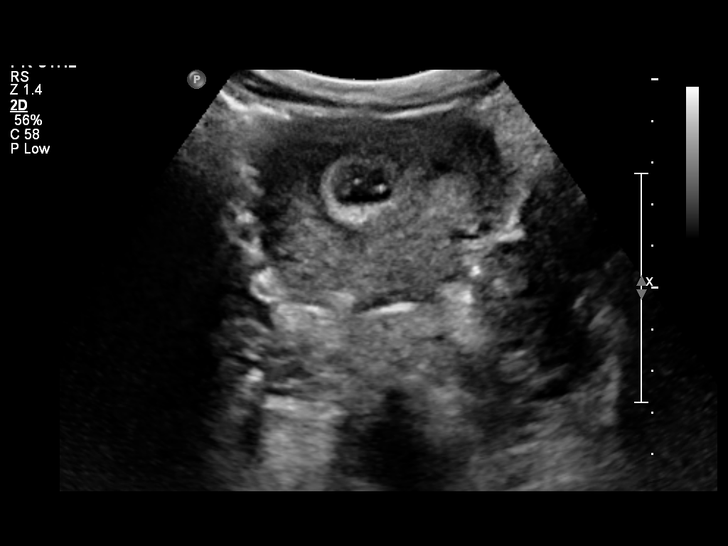
[im 15/58]
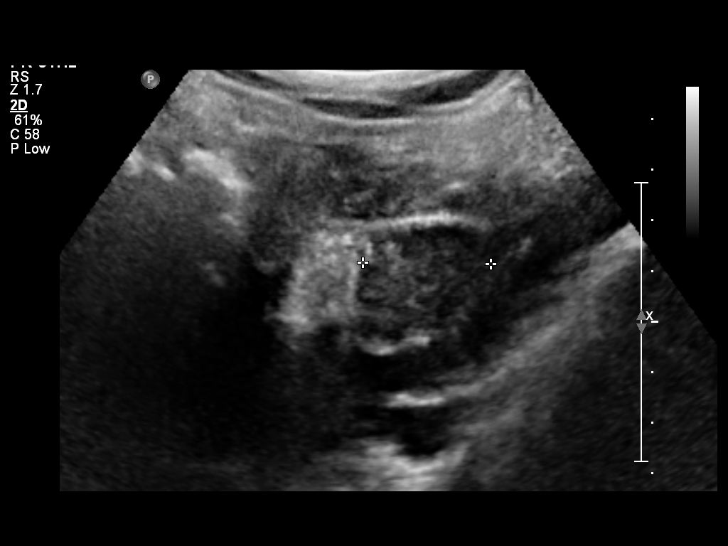
[im 20/58]
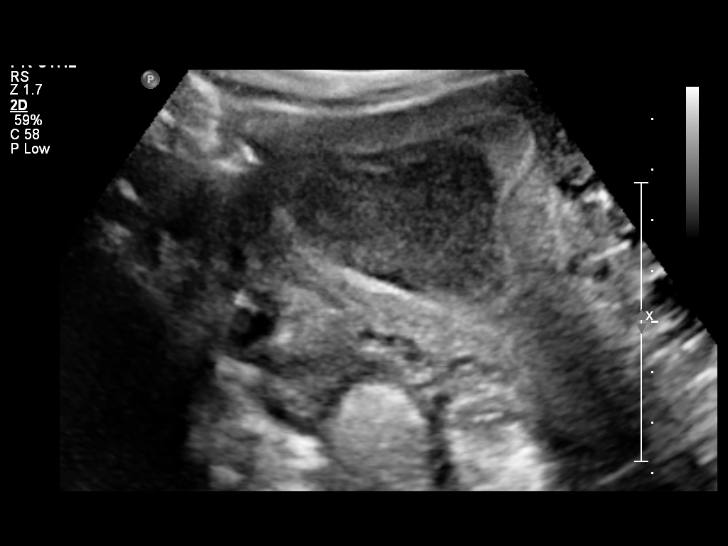
[im 24/58]
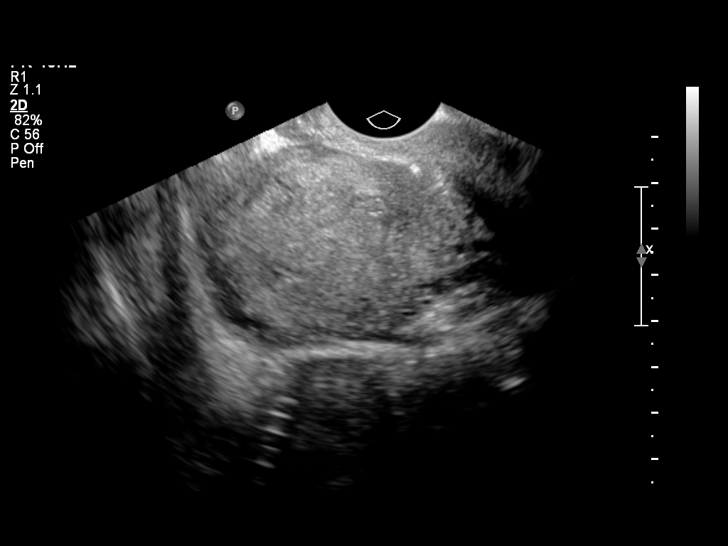
[im 28/58]
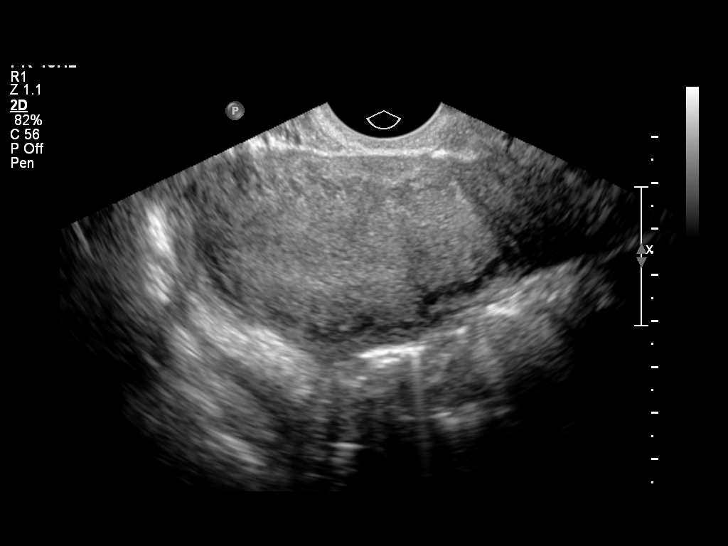
[im 32/58]
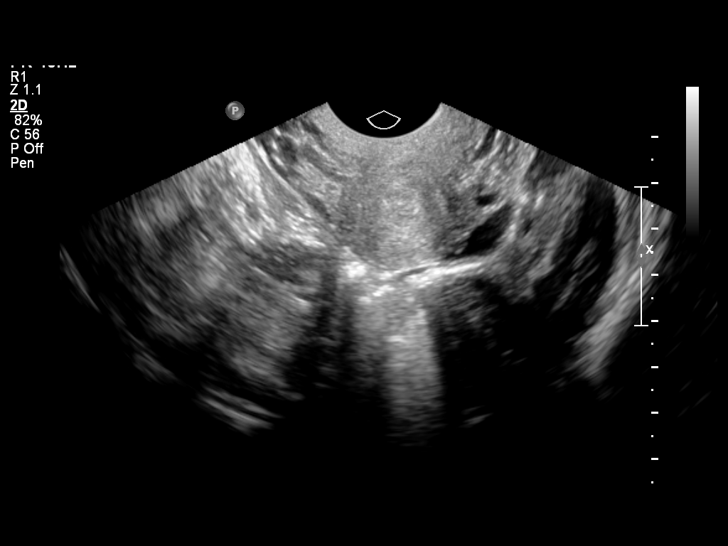
[im 36/58]
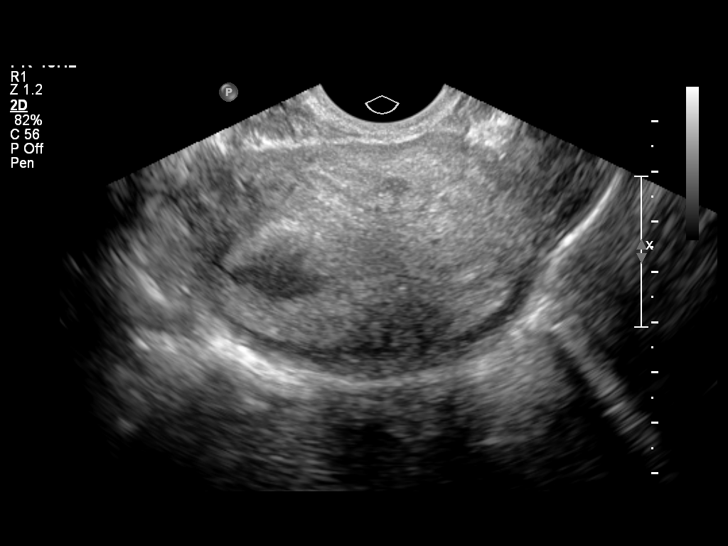
[im 41/58]
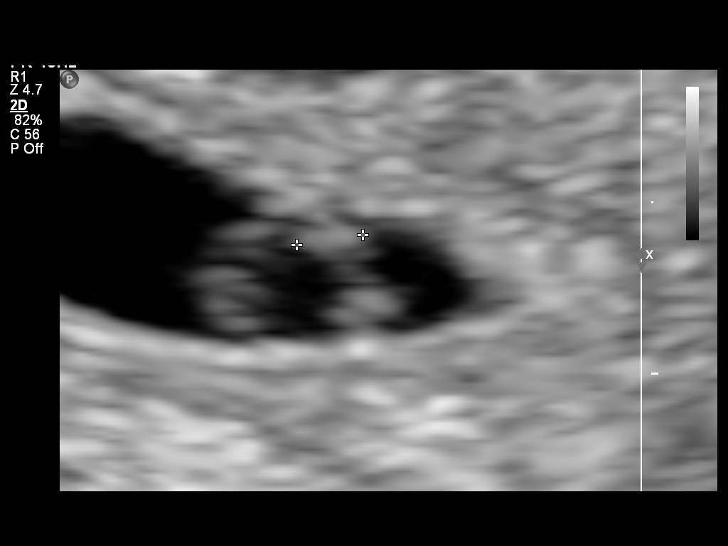
[im 45/58]
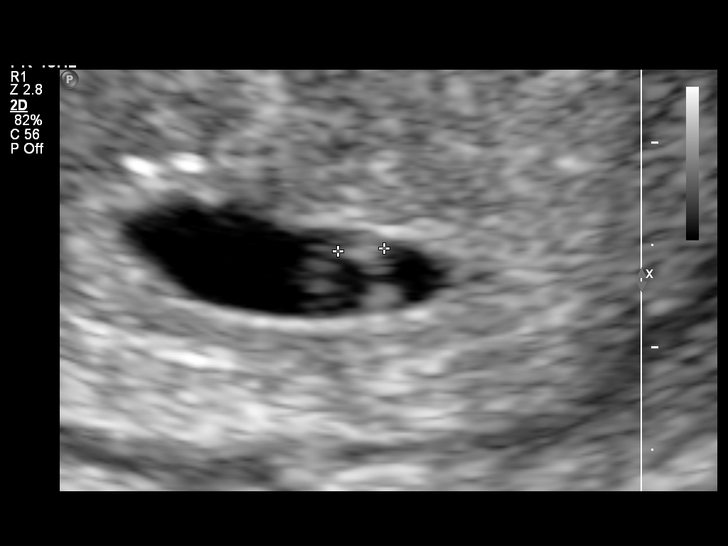
[im 49/58]
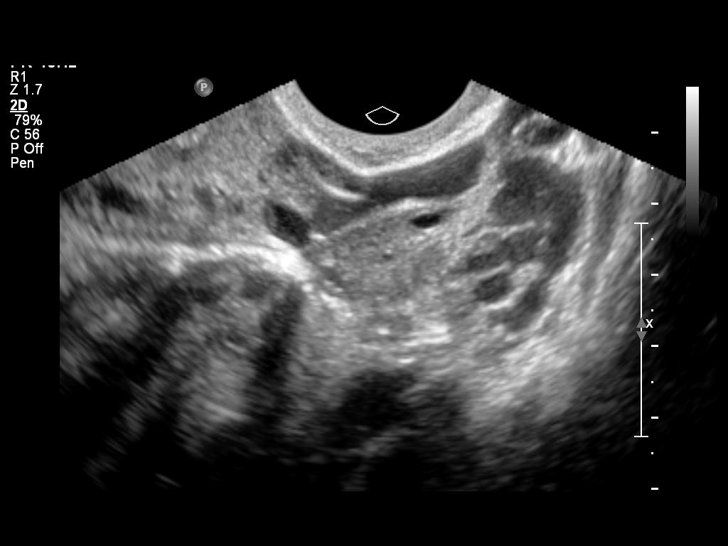
[im 53/58]
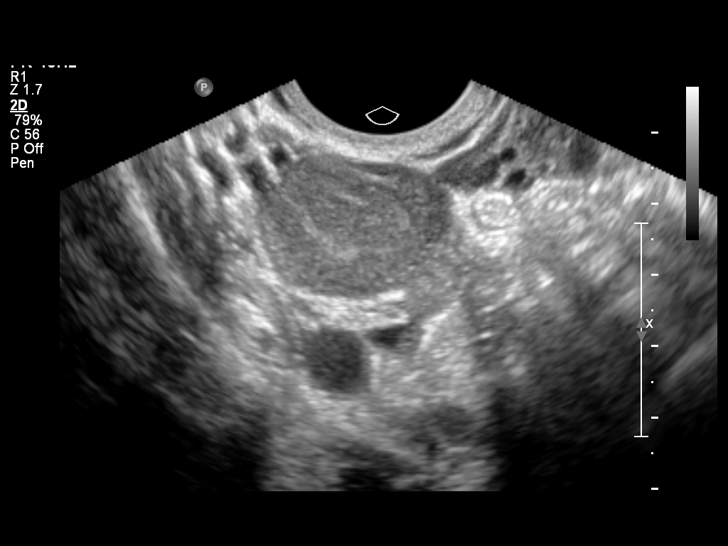
[im 58/58]
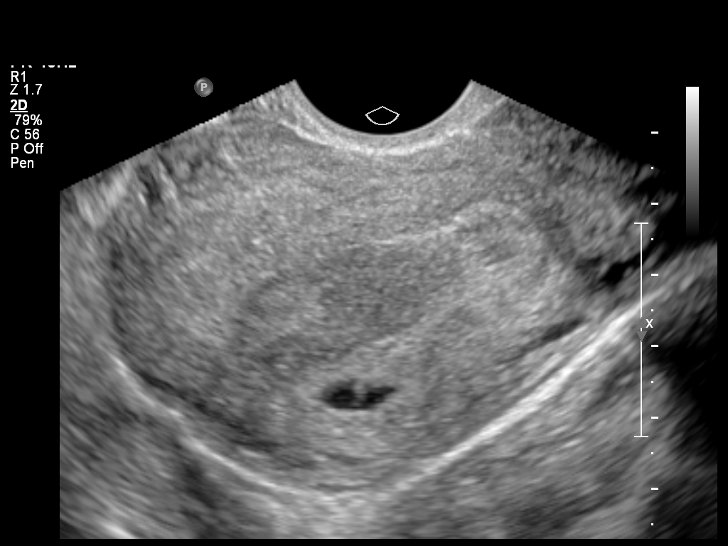

[14 of 28 positions shown; findings below may reference images not displayed]

FINDINGS: Intrauterine gestational sac: Visualized/normal in shape.

Yolk sac:  Visualized

Embryo:  Visualized

Cardiac Activity: Visualized

Heart Rate:  Too small to Trace bpm

MSD:    mm    w     d

CRL:   2  mm   5 w 6 d                  US EDC: 03/15/2014

Maternal uterus/adnexae: Large subchorionic hemorrhage noted
measuring 4.9 x 2.4 x 2.4 cm ovaries are symmetric in size and
echotexture. No adnexal masses. No free fluid.
IMPRESSION: Early intrauterine pregnancy. The fetal pole is too small to
accurately measure heart rate.

Large subchorionic hemorrhage.

## 2016-03-27 ENCOUNTER — Encounter (HOSPITAL_COMMUNITY): Payer: Self-pay

## 2016-07-15 ENCOUNTER — Encounter (HOSPITAL_COMMUNITY): Payer: Self-pay

## 2016-07-15 ENCOUNTER — Emergency Department (HOSPITAL_COMMUNITY)
Admission: EM | Admit: 2016-07-15 | Discharge: 2016-07-15 | Disposition: A | Discharge status: Home / Self Care | Payer: Medicaid Other | Attending: Emergency Medicine | Admitting: Emergency Medicine

## 2016-07-15 ENCOUNTER — Emergency Department (HOSPITAL_COMMUNITY): Payer: Medicaid Other

## 2016-07-15 DIAGNOSIS — R109 Unspecified abdominal pain: Secondary | ICD-10-CM | POA: Diagnosis present

## 2016-07-15 DIAGNOSIS — F17210 Nicotine dependence, cigarettes, uncomplicated: Secondary | ICD-10-CM | POA: Diagnosis not present

## 2016-07-15 DIAGNOSIS — N12 Tubulo-interstitial nephritis, not specified as acute or chronic: Secondary | ICD-10-CM | POA: Diagnosis not present

## 2016-07-15 HISTORY — DX: Accidental discharge from unspecified firearms or gun, initial encounter: W34.00XA

## 2016-07-15 LAB — URINALYSIS, ROUTINE W REFLEX MICROSCOPIC
BILIRUBIN URINE: NEGATIVE
GLUCOSE, UA: NEGATIVE mg/dL
KETONES UR: 5 mg/dL — AB
Nitrite: POSITIVE — AB
Protein, ur: 100 mg/dL — AB
Specific Gravity, Urine: 1.019 (ref 1.005–1.030)
pH: 6 (ref 5.0–8.0)

## 2016-07-15 LAB — COMPREHENSIVE METABOLIC PANEL
ALBUMIN: 3.9 g/dL (ref 3.5–5.0)
ALK PHOS: 59 U/L (ref 38–126)
ALT: 11 U/L — ABNORMAL LOW (ref 14–54)
AST: 14 U/L — AB (ref 15–41)
Anion gap: 10 (ref 5–15)
BILIRUBIN TOTAL: 1.2 mg/dL (ref 0.3–1.2)
BUN: <5 mg/dL — ABNORMAL LOW (ref 6–20)
CALCIUM: 8.7 mg/dL — AB (ref 8.9–10.3)
CO2: 24 mmol/L (ref 22–32)
Chloride: 103 mmol/L (ref 101–111)
Creatinine, Ser: 0.71 mg/dL (ref 0.44–1.00)
GFR calc Af Amer: >60 mL/min (ref >60)
GLUCOSE: 101 mg/dL — AB (ref 65–99)
POTASSIUM: 3.1 mmol/L — AB (ref 3.5–5.1)
Sodium: 137 mmol/L (ref 135–145)
Total Protein: 6.6 g/dL (ref 6.5–8.1)

## 2016-07-15 LAB — CBC WITH DIFFERENTIAL/PLATELET
BASOS ABS: 0 10*3/uL (ref 0.0–0.1)
BASOS PCT: 0 %
EOS ABS: 0 10*3/uL (ref 0.0–0.7)
EOS PCT: 0 %
HCT: 39.3 % (ref 36.0–46.0)
Hemoglobin: 13.1 g/dL (ref 12.0–15.0)
LYMPHS PCT: 9 %
Lymphs Abs: 1.5 10*3/uL (ref 0.7–4.0)
MCH: 31 pg (ref 26.0–34.0)
MCHC: 33.3 g/dL (ref 30.0–36.0)
MCV: 93.1 fL (ref 78.0–100.0)
MONO ABS: 2 10*3/uL — AB (ref 0.1–1.0)
Monocytes Relative: 12 %
Neutro Abs: 13.6 10*3/uL — ABNORMAL HIGH (ref 1.7–7.7)
Neutrophils Relative %: 79 %
Platelets: 258 10*3/uL (ref 150–400)
RBC: 4.22 MIL/uL (ref 3.87–5.11)
RDW: 12.9 % (ref 11.5–15.5)
WBC: 17.2 10*3/uL — AB (ref 4.0–10.5)

## 2016-07-15 LAB — I-STAT BETA HCG BLOOD, ED (MC, WL, AP ONLY)

## 2016-07-15 MED ORDER — DEXTROSE 5 % IV SOLN
1.0000 g | Freq: Once | INTRAVENOUS | Status: AC
  Administered 2016-07-15: 1 g via INTRAVENOUS
  Filled 2016-07-15: qty 10

## 2016-07-15 MED ORDER — ONDANSETRON HCL 4 MG/2ML IJ SOLN
4.0000 mg | Freq: Once | INTRAMUSCULAR | Status: AC
  Administered 2016-07-15: 4 mg via INTRAVENOUS
  Filled 2016-07-15: qty 2

## 2016-07-15 MED ORDER — OXYCODONE-ACETAMINOPHEN 5-325 MG PO TABS
1.0000 | ORAL_TABLET | Freq: Once | ORAL | Status: AC
  Administered 2016-07-15: 1 via ORAL
  Filled 2016-07-15: qty 1

## 2016-07-15 MED ORDER — SULFAMETHOXAZOLE-TRIMETHOPRIM 800-160 MG PO TABS: 1.0000 | ORAL_TABLET | Freq: Two times a day (BID) | ORAL | 0 refills | Status: AC

## 2016-07-15 MED ORDER — KETOROLAC TROMETHAMINE 30 MG/ML IJ SOLN
30.0000 mg | Freq: Once | INTRAMUSCULAR | Status: AC
  Administered 2016-07-15: 30 mg via INTRAVENOUS
  Filled 2016-07-15: qty 1

## 2016-07-15 MED ORDER — ACETAMINOPHEN 500 MG PO TABS
1000.0000 mg | ORAL_TABLET | Freq: Once | ORAL | Status: AC
  Administered 2016-07-15: 1000 mg via ORAL
  Filled 2016-07-15: qty 2

## 2016-07-15 MED ORDER — MORPHINE SULFATE (PF) 4 MG/ML IV SOLN
4.0000 mg | Freq: Once | INTRAVENOUS | Status: AC
  Administered 2016-07-15: 4 mg via INTRAVENOUS
  Filled 2016-07-15: qty 1

## 2016-07-15 MED ORDER — SODIUM CHLORIDE 0.9 % IV BOLUS (SEPSIS)
1000.0000 mL | Freq: Once | INTRAVENOUS | Status: AC
  Administered 2016-07-15: 1000 mL via INTRAVENOUS

## 2016-07-15 NOTE — ED Notes (Signed)
Patient transported to CT 
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
## 2016-07-15 NOTE — Discharge Instructions (Signed)
Your workup today suggests that you have a urinary tract infection (UTI) which has spread to your kidneys.  Please take your antibiotic as prescribed and over-the-counter pain medication (Tylenol or Motrin) as needed, but no more than recommended on the label instructions.  Drink PLENTY of fluids.  Call your regular doctor to schedule the next available appointment to follow up on today's ED visit, or return immediately to the ED if your pain worsens, you have decreased urine production, develop fever, persistent vomiting, or other symptoms that concern you.  

## 2016-07-15 NOTE — ED Triage Notes (Signed)
Per Pt, Pt reports left lower flank pain that started about a month ago, but increased significantly last night. Pt reports nausea, but denies V/D, vaginal Discharge, or urinary symptoms.

## 2016-07-15 NOTE — ED Notes (Signed)
Pt reports she cannot urinate now.

## 2016-07-15 NOTE — ED Notes (Signed)
Pt verbalized understanding of d/c instructions and has no further questions. VSS, NAD. Pt to follow up with [facility].

## 2016-07-28 ENCOUNTER — Ambulatory Visit: Payer: Medicaid Other | Admitting: Obstetrics & Gynecology

## 2016-10-23 ENCOUNTER — Emergency Department (HOSPITAL_COMMUNITY)
Admission: EM | Admit: 2016-10-23 | Discharge: 2016-10-23 | Disposition: A | Discharge status: Home / Self Care | Payer: Medicaid Other | Attending: Emergency Medicine | Admitting: Emergency Medicine

## 2016-10-23 ENCOUNTER — Encounter (HOSPITAL_COMMUNITY): Payer: Self-pay | Admitting: Emergency Medicine

## 2016-10-23 DIAGNOSIS — F17210 Nicotine dependence, cigarettes, uncomplicated: Secondary | ICD-10-CM | POA: Diagnosis not present

## 2016-10-23 DIAGNOSIS — R1030 Lower abdominal pain, unspecified: Secondary | ICD-10-CM | POA: Diagnosis present

## 2016-10-23 DIAGNOSIS — B9689 Other specified bacterial agents as the cause of diseases classified elsewhere: Secondary | ICD-10-CM

## 2016-10-23 DIAGNOSIS — N760 Acute vaginitis: Secondary | ICD-10-CM | POA: Insufficient documentation

## 2016-10-23 LAB — COMPREHENSIVE METABOLIC PANEL
ALBUMIN: 4.1 g/dL (ref 3.5–5.0)
ALT: 13 U/L — ABNORMAL LOW (ref 14–54)
ANION GAP: 7 (ref 5–15)
AST: 17 U/L (ref 15–41)
Alkaline Phosphatase: 45 U/L (ref 38–126)
BUN: 9 mg/dL (ref 6–20)
CO2: 25 mmol/L (ref 22–32)
Calcium: 8.8 mg/dL — ABNORMAL LOW (ref 8.9–10.3)
Chloride: 107 mmol/L (ref 101–111)
Creatinine, Ser: 0.74 mg/dL (ref 0.44–1.00)
GFR calc Af Amer: >60 mL/min (ref >60)
GFR calc non Af Amer: >60 mL/min (ref >60)
GLUCOSE: 93 mg/dL (ref 65–99)
Potassium: 3.8 mmol/L (ref 3.5–5.1)
SODIUM: 139 mmol/L (ref 135–145)
Total Bilirubin: 0.7 mg/dL (ref 0.3–1.2)
Total Protein: 6.2 g/dL — ABNORMAL LOW (ref 6.5–8.1)

## 2016-10-23 LAB — URINALYSIS, ROUTINE W REFLEX MICROSCOPIC
Bilirubin Urine: NEGATIVE
Glucose, UA: NEGATIVE mg/dL
HGB URINE DIPSTICK: NEGATIVE
Ketones, ur: NEGATIVE mg/dL
NITRITE: NEGATIVE
PROTEIN: NEGATIVE mg/dL
Specific Gravity, Urine: 1.021 (ref 1.005–1.030)
pH: 6 (ref 5.0–8.0)

## 2016-10-23 LAB — WET PREP, GENITAL
Sperm: NONE SEEN
Trich, Wet Prep: NONE SEEN
Yeast Wet Prep HPF POC: NONE SEEN

## 2016-10-23 LAB — LIPASE, BLOOD: Lipase: 27 U/L (ref 11–51)

## 2016-10-23 LAB — CBC
HEMATOCRIT: 40.8 % (ref 36.0–46.0)
HEMOGLOBIN: 13.4 g/dL (ref 12.0–15.0)
MCH: 30.7 pg (ref 26.0–34.0)
MCHC: 32.8 g/dL (ref 30.0–36.0)
MCV: 93.4 fL (ref 78.0–100.0)
Platelets: 259 10*3/uL (ref 150–400)
RBC: 4.37 MIL/uL (ref 3.87–5.11)
RDW: 12.9 % (ref 11.5–15.5)
WBC: 14.2 10*3/uL — ABNORMAL HIGH (ref 4.0–10.5)

## 2016-10-23 LAB — POC URINE PREG, ED: PREG TEST UR: NEGATIVE

## 2016-10-23 MED ORDER — CEFTRIAXONE SODIUM 250 MG IJ SOLR
250.0000 mg | Freq: Once | INTRAMUSCULAR | Status: AC
  Administered 2016-10-23: 250 mg via INTRAMUSCULAR
  Filled 2016-10-23: qty 250

## 2016-10-23 MED ORDER — METRONIDAZOLE 500 MG PO TABS
500.0000 mg | ORAL_TABLET | Freq: Once | ORAL | Status: AC
  Administered 2016-10-23: 500 mg via ORAL
  Filled 2016-10-23: qty 1

## 2016-10-23 MED ORDER — AZITHROMYCIN 250 MG PO TABS
1000.0000 mg | ORAL_TABLET | Freq: Once | ORAL | Status: AC
  Administered 2016-10-23: 1000 mg via ORAL
  Filled 2016-10-23: qty 4

## 2016-10-23 MED ORDER — SODIUM CHLORIDE 0.9 % IV BOLUS (SEPSIS)
1000.0000 mL | Freq: Once | INTRAVENOUS | Status: AC
  Administered 2016-10-23: 1000 mL via INTRAVENOUS

## 2016-10-23 MED ORDER — KETOROLAC TROMETHAMINE 30 MG/ML IJ SOLN
30.0000 mg | Freq: Once | INTRAMUSCULAR | Status: AC
  Administered 2016-10-23: 30 mg via INTRAVENOUS
  Filled 2016-10-23: qty 1

## 2016-10-23 MED ORDER — LIDOCAINE HCL (PF) 1 % IJ SOLN
0.9000 mL | Freq: Once | INTRAMUSCULAR | Status: AC
Start: 2016-10-23 — End: 2016-10-23
  Administered 2016-10-23: 0.9 mL
  Filled 2016-10-23: qty 5

## 2016-10-23 MED ORDER — METRONIDAZOLE 500 MG PO TABS: 500.0000 mg | ORAL_TABLET | Freq: Two times a day (BID) | ORAL | 0 refills | Status: DC
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
## 2016-10-23 NOTE — ED Triage Notes (Signed)
Pt states she has been having sharp abd pains since last night. Pt said two weeks ago she had the same episode but it went away. Pt had waves of nausea/vomtiing, cold sweats.
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
## 2016-10-24 LAB — GC/CHLAMYDIA PROBE AMP (~~LOC~~) NOT AT ARMC
Chlamydia: NEGATIVE
NEISSERIA GONORRHEA: POSITIVE — AB

## 2017-01-04 ENCOUNTER — Emergency Department (HOSPITAL_COMMUNITY): Payer: Medicaid Other

## 2017-01-04 ENCOUNTER — Encounter (HOSPITAL_COMMUNITY): Payer: Self-pay

## 2017-01-04 ENCOUNTER — Emergency Department (HOSPITAL_COMMUNITY)
Admission: EM | Admit: 2017-01-04 | Discharge: 2017-01-04 | Disposition: A | Discharge status: Home / Self Care | Payer: Medicaid Other | Attending: Emergency Medicine | Admitting: Emergency Medicine

## 2017-01-04 DIAGNOSIS — Y998 Other external cause status: Secondary | ICD-10-CM | POA: Insufficient documentation

## 2017-01-04 DIAGNOSIS — Z23 Encounter for immunization: Secondary | ICD-10-CM | POA: Insufficient documentation

## 2017-01-04 DIAGNOSIS — Y9289 Other specified places as the place of occurrence of the external cause: Secondary | ICD-10-CM | POA: Diagnosis not present

## 2017-01-04 DIAGNOSIS — W19XXXA Unspecified fall, initial encounter: Secondary | ICD-10-CM | POA: Insufficient documentation

## 2017-01-04 DIAGNOSIS — Y939 Activity, unspecified: Secondary | ICD-10-CM | POA: Insufficient documentation

## 2017-01-04 DIAGNOSIS — F17210 Nicotine dependence, cigarettes, uncomplicated: Secondary | ICD-10-CM | POA: Diagnosis not present

## 2017-01-04 DIAGNOSIS — S51811A Laceration without foreign body of right forearm, initial encounter: Secondary | ICD-10-CM | POA: Insufficient documentation

## 2017-01-04 MED ORDER — OXYCODONE-ACETAMINOPHEN 5-325 MG PO TABS
1.0000 | ORAL_TABLET | Freq: Once | ORAL | Status: AC
  Administered 2017-01-04: 1 via ORAL
  Filled 2017-01-04: qty 1

## 2017-01-04 MED ORDER — LIDOCAINE-EPINEPHRINE (PF) 2 %-1:200000 IJ SOLN
10.0000 mL | Freq: Once | INTRAMUSCULAR | Status: AC
  Administered 2017-01-04: 10 mL
  Filled 2017-01-04: qty 20

## 2017-01-04 MED ORDER — TETANUS-DIPHTH-ACELL PERTUSSIS 5-2.5-18.5 LF-MCG/0.5 IM SUSP
0.5000 mL | Freq: Once | INTRAMUSCULAR | Status: AC
  Administered 2017-01-04: 0.5 mL via INTRAMUSCULAR
  Filled 2017-01-04: qty 0.5

## 2017-01-04 NOTE — ED Notes (Signed)
Patient Alert and oriented X4. Stable and ambulatory. Patient verbalized understanding of the discharge instructions.  Patient belongings were taken by the patient.  

## 2017-01-04 NOTE — Discharge Instructions (Signed)
Please have your stitches removed in 7 days. You may return to the emergency Department, urgent care, or follow-up with a primary care provider.  You may take 800 mg of ibuprofen with food every 8 hours or 650 mg of Tylenol every 6 hours as needed for pain control.  Please keep her bandage in place for the first 24 hours. After that time, please remove the bandage, clean the wound with warm soap and water, apply a topical antibiotic like bacitracin, followed by the Vaseline gauze, then apply the white gauze, and finally the brown bandage. Please change this dressing every 24 hours after tomorrow.  If you develop any new or worsening symptoms, including thick white drainage from the wounds, or if the skin around the wound becomes red, hot, or swollen, please return to the emergency department for reevaluation at that time.

## 2017-01-04 NOTE — ED Notes (Signed)
Lac repair complete.  Patient resting comfortably given apple juice
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
## 2017-01-04 NOTE — ED Notes (Signed)
EDP at bedside  
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
## 2017-04-05 ENCOUNTER — Emergency Department (HOSPITAL_COMMUNITY)
Admission: EM | Admit: 2017-04-05 | Discharge: 2017-04-05 | Disposition: A | Discharge status: Home / Self Care | Payer: Medicaid Other | Attending: Emergency Medicine | Admitting: Emergency Medicine

## 2017-04-05 ENCOUNTER — Other Ambulatory Visit: Payer: Self-pay

## 2017-04-05 ENCOUNTER — Encounter (HOSPITAL_COMMUNITY): Payer: Self-pay | Admitting: Emergency Medicine

## 2017-04-05 DIAGNOSIS — N760 Acute vaginitis: Secondary | ICD-10-CM | POA: Insufficient documentation

## 2017-04-05 DIAGNOSIS — B9689 Other specified bacterial agents as the cause of diseases classified elsewhere: Secondary | ICD-10-CM | POA: Diagnosis not present

## 2017-04-05 DIAGNOSIS — Z79899 Other long term (current) drug therapy: Secondary | ICD-10-CM | POA: Insufficient documentation

## 2017-04-05 DIAGNOSIS — N898 Other specified noninflammatory disorders of vagina: Secondary | ICD-10-CM

## 2017-04-05 DIAGNOSIS — Z711 Person with feared health complaint in whom no diagnosis is made: Secondary | ICD-10-CM

## 2017-04-05 DIAGNOSIS — F17210 Nicotine dependence, cigarettes, uncomplicated: Secondary | ICD-10-CM | POA: Diagnosis not present

## 2017-04-05 LAB — URINALYSIS, ROUTINE W REFLEX MICROSCOPIC
BILIRUBIN URINE: NEGATIVE
Glucose, UA: NEGATIVE mg/dL
HGB URINE DIPSTICK: NEGATIVE
Ketones, ur: NEGATIVE mg/dL
Leukocytes, UA: NEGATIVE
Nitrite: NEGATIVE
PH: 7 (ref 5.0–8.0)
Protein, ur: NEGATIVE mg/dL
SPECIFIC GRAVITY, URINE: 1.014 (ref 1.005–1.030)

## 2017-04-05 LAB — POC URINE PREG, ED: Preg Test, Ur: NEGATIVE

## 2017-04-05 LAB — WET PREP, GENITAL
SPERM: NONE SEEN
Trich, Wet Prep: NONE SEEN
Yeast Wet Prep HPF POC: NONE SEEN

## 2017-04-05 MED ORDER — METRONIDAZOLE 500 MG PO TABS: 500.0000 mg | ORAL_TABLET | Freq: Two times a day (BID) | ORAL | 0 refills | Status: DC

## 2017-04-05 MED ORDER — DOCOSANOL 10 % EX CREA: 1.0000 "application " | TOPICAL_CREAM | Freq: Every day | CUTANEOUS | 0 refills | Status: DC

## 2017-04-05 NOTE — ED Triage Notes (Signed)
Pt to ER for evaluation of 1 week white, thick vaginal discharge. Denies any other associated symptoms. States also would like a pregnancy test, has not had a cycle since October.
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
## 2017-04-05 NOTE — Discharge Instructions (Signed)
As discussed, take entire course of antibiotics even if you feel better.  Make sure to avoid alcohol while taking this medication as it can cause you to feel very ill. Avoid sexual intercourse for at least 7 days and until symptoms resolve. You will receive a call if any of the results are positive in the next few days.  If positive, you will need to notify any sexual partners and have them treated as well.  You will need to avoid sexual intercourse for at least 7 days after completion of treatment.  Make sure you stay well-hydrated and follow-up with the women's outpatient clinic. Apply cream to affected area on your lip 5 times a day until it resolves.  Follow-up with your primary care provider. Return sooner if symptoms worsen or new concerning symptoms in the meantime.

## 2017-04-06 LAB — GC/CHLAMYDIA PROBE AMP (~~LOC~~) NOT AT ARMC
Chlamydia: NEGATIVE
Neisseria Gonorrhea: NEGATIVE

## 2017-04-06 LAB — HIV ANTIBODY (ROUTINE TESTING W REFLEX): HIV Screen 4th Generation wRfx: NONREACTIVE

## 2017-04-06 LAB — RPR: RPR Ser Ql: NONREACTIVE

## 2017-08-15 ENCOUNTER — Emergency Department (HOSPITAL_COMMUNITY)
Admission: EM | Admit: 2017-08-15 | Discharge: 2017-08-15 | Disposition: A | Discharge status: Home / Self Care | Payer: Medicaid Other | Attending: Emergency Medicine | Admitting: Emergency Medicine

## 2017-08-15 ENCOUNTER — Other Ambulatory Visit: Payer: Self-pay

## 2017-08-15 ENCOUNTER — Encounter (HOSPITAL_COMMUNITY): Payer: Self-pay | Admitting: Emergency Medicine

## 2017-08-15 DIAGNOSIS — R197 Diarrhea, unspecified: Secondary | ICD-10-CM | POA: Diagnosis not present

## 2017-08-15 DIAGNOSIS — R112 Nausea with vomiting, unspecified: Secondary | ICD-10-CM | POA: Diagnosis present

## 2017-08-15 DIAGNOSIS — Z79899 Other long term (current) drug therapy: Secondary | ICD-10-CM | POA: Diagnosis not present

## 2017-08-15 DIAGNOSIS — R109 Unspecified abdominal pain: Secondary | ICD-10-CM | POA: Insufficient documentation

## 2017-08-15 DIAGNOSIS — F17210 Nicotine dependence, cigarettes, uncomplicated: Secondary | ICD-10-CM | POA: Insufficient documentation

## 2017-08-15 LAB — URINALYSIS, ROUTINE W REFLEX MICROSCOPIC
GLUCOSE, UA: NEGATIVE mg/dL
Ketones, ur: NEGATIVE mg/dL
Leukocytes, UA: NEGATIVE
NITRITE: NEGATIVE
PROTEIN: NEGATIVE mg/dL
Specific Gravity, Urine: 1.025 (ref 1.005–1.030)
pH: 5 (ref 5.0–8.0)

## 2017-08-15 LAB — CBC
HCT: 41.3 % (ref 36.0–46.0)
HEMOGLOBIN: 13.8 g/dL (ref 12.0–15.0)
MCH: 31.2 pg (ref 26.0–34.0)
MCHC: 33.4 g/dL (ref 30.0–36.0)
MCV: 93.2 fL (ref 78.0–100.0)
Platelets: 268 10*3/uL (ref 150–400)
RBC: 4.43 MIL/uL (ref 3.87–5.11)
RDW: 13.9 % (ref 11.5–15.5)
WBC: 5.6 10*3/uL (ref 4.0–10.5)

## 2017-08-15 LAB — COMPREHENSIVE METABOLIC PANEL
ALT: 16 U/L (ref 14–54)
ANION GAP: 10 (ref 5–15)
AST: 18 U/L (ref 15–41)
Albumin: 3.8 g/dL (ref 3.5–5.0)
Alkaline Phosphatase: 55 U/L (ref 38–126)
BILIRUBIN TOTAL: 0.7 mg/dL (ref 0.3–1.2)
BUN: 7 mg/dL (ref 6–20)
CO2: 22 mmol/L (ref 22–32)
Calcium: 8.6 mg/dL — ABNORMAL LOW (ref 8.9–10.3)
Chloride: 105 mmol/L (ref 101–111)
Creatinine, Ser: 0.65 mg/dL (ref 0.44–1.00)
GLUCOSE: 102 mg/dL — AB (ref 65–99)
Potassium: 3.1 mmol/L — ABNORMAL LOW (ref 3.5–5.1)
Sodium: 137 mmol/L (ref 135–145)
TOTAL PROTEIN: 6.3 g/dL — AB (ref 6.5–8.1)

## 2017-08-15 LAB — I-STAT BETA HCG BLOOD, ED (MC, WL, AP ONLY): I-stat hCG, quantitative: <5 m[IU]/mL (ref <5)

## 2017-08-15 LAB — LIPASE, BLOOD: Lipase: 33 U/L (ref 11–51)

## 2017-08-15 MED ORDER — RANITIDINE HCL 150 MG PO TABS: 150.0000 mg | ORAL_TABLET | Freq: Two times a day (BID) | ORAL | 0 refills | Status: DC

## 2017-08-15 MED ORDER — POTASSIUM CHLORIDE CRYS ER 20 MEQ PO TBCR
40.0000 meq | EXTENDED_RELEASE_TABLET | Freq: Once | ORAL | Status: AC
  Administered 2017-08-15: 40 meq via ORAL
  Filled 2017-08-15: qty 2

## 2017-08-15 NOTE — ED Notes (Signed)
Patient has been sleeping since after taking medication and drinking Sprite. No episodes of vomiting noted.
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
## 2017-08-15 NOTE — Discharge Instructions (Addendum)
1. Medications: zantac, usual home medications °2. Treatment: rest, drink plenty of fluids, advance diet slowly °3. Follow Up: Please followup with your primary doctor in 2 days for discussion of your diagnoses and further evaluation after today's visit; if you do not have a primary care doctor use the resource guide provided to find one; Please return to the ER for persistent vomiting, high fevers or worsening symptoms ° °

## 2017-08-15 NOTE — ED Triage Notes (Signed)
Pt began having nausea, vomiting, diarrhea, chills around 3 am today. Slept all day. Woke feeling better but hasn't eaten today.  No pain currently.

## 2017-11-16 ENCOUNTER — Ambulatory Visit (INDEPENDENT_AMBULATORY_CARE_PROVIDER_SITE_OTHER): Payer: Medicaid Other | Admitting: General Practice

## 2017-11-16 ENCOUNTER — Encounter: Payer: Self-pay | Admitting: General Practice

## 2017-11-16 VITALS — BP 123/78 | HR 78 | Ht 67.0 in | Wt 123.0 lb

## 2017-11-16 DIAGNOSIS — Z111 Encounter for screening for respiratory tuberculosis: Secondary | ICD-10-CM | POA: Diagnosis not present

## 2017-11-16 MED ORDER — TUBERCULIN PPD 5 UNIT/0.1ML ID SOLN
5.0000 [IU] | Freq: Once | INTRADERMAL | Status: AC
  Administered 2017-11-16: 5 [IU] via INTRADERMAL

## 2017-11-16 NOTE — Progress Notes (Signed)
Patient presents to office today for TB skin test. Administered in Left Forearm. Patient will return Thursday for PPD reading. Patient had no questions.

## 2017-11-19 ENCOUNTER — Ambulatory Visit (INDEPENDENT_AMBULATORY_CARE_PROVIDER_SITE_OTHER): Payer: Medicaid Other | Admitting: General Practice

## 2017-11-19 ENCOUNTER — Encounter: Payer: Self-pay | Admitting: General Practice

## 2017-11-19 DIAGNOSIS — Z111 Encounter for screening for respiratory tuberculosis: Secondary | ICD-10-CM

## 2017-11-19 NOTE — Progress Notes (Signed)
Patient presents to office today for TB skin test reading. No redness or induration noted on left forearm- test negative. Letter given to patient per request for employer. Patient had no questions.

## 2017-11-23 NOTE — Progress Notes (Signed)
I have reviewed the chart and agree with nursing staff's documentation of this patient's encounter.  Aletha Halim, MD 11/23/2017 1:05 PM

## 2017-12-28 ENCOUNTER — Ambulatory Visit: Payer: Medicaid Other | Admitting: Obstetrics & Gynecology

## 2017-12-28 ENCOUNTER — Encounter: Payer: Self-pay | Admitting: *Deleted

## 2017-12-28 ENCOUNTER — Encounter: Payer: Self-pay | Admitting: Obstetrics & Gynecology

## 2017-12-28 NOTE — Progress Notes (Signed)
[name] did not keep her scheduled appointment for annual exam. Per discussion with Dr. Roselie Awkward does not need to be called, may reschedule if she calls.

## 2018-05-12 ENCOUNTER — Telehealth: Payer: Self-pay | Admitting: Medical

## 2018-05-12 NOTE — Telephone Encounter (Signed)
Patient called in to request her TB test and also requested information of how to retrieve. Informed the patient and patient's mother of the written consent. Stated [place], educated the patient of my chart. Sent the patient a link to her cell phone and also informed the patient of the number to contact the Mychart help desk.

## 2018-07-20 ENCOUNTER — Telehealth: Payer: Self-pay | Admitting: Family Medicine

## 2018-07-20 NOTE — Telephone Encounter (Signed)
The patient called to schedule an appointment for an pregnancy test and discharge. Informed of the nurse visit on Friday.

## 2018-07-23 ENCOUNTER — Other Ambulatory Visit: Payer: Self-pay

## 2018-07-23 ENCOUNTER — Ambulatory Visit (INDEPENDENT_AMBULATORY_CARE_PROVIDER_SITE_OTHER): Payer: Medicaid Other | Admitting: *Deleted

## 2018-07-23 VITALS — Temp 98.8°F

## 2018-07-23 DIAGNOSIS — Z3200 Encounter for pregnancy test, result unknown: Secondary | ICD-10-CM

## 2018-07-23 DIAGNOSIS — Z3201 Encounter for pregnancy test, result positive: Secondary | ICD-10-CM

## 2018-07-23 DIAGNOSIS — Z3490 Encounter for supervision of normal pregnancy, unspecified, unspecified trimester: Secondary | ICD-10-CM

## 2018-07-23 LAB — POCT PREGNANCY, URINE: Preg Test, Ur: POSITIVE — AB

## 2018-07-23 NOTE — Progress Notes (Signed)
Here for pregnancy test which was positive. States she isn't sure when her period was ; states had one in early December but usually comes at end of month.States not sure if she had one at end of December ;but did not have one at end of January, or at all in  February, or March. Will order ultrasound to determine dating. [place] scheduled and informed her to come back for results after [place]. She voices understanding.  Nakoma Gotwalt,RN

## 2018-07-23 NOTE — Progress Notes (Signed)
Patient seen and assessed by nursing staff.  Agree with documentation and plan.  

## 2018-07-27 ENCOUNTER — Ambulatory Visit: Payer: Medicaid Other

## 2018-07-27 ENCOUNTER — Ambulatory Visit (HOSPITAL_COMMUNITY)
Admission: RE | Admit: 2018-07-27 | Discharge: 2018-07-27 | Disposition: A | Discharge status: Home / Self Care | Payer: Self-pay | Source: Ambulatory Visit | Attending: Family Medicine | Admitting: Family Medicine

## 2018-07-27 ENCOUNTER — Other Ambulatory Visit: Payer: Self-pay

## 2018-07-27 DIAGNOSIS — Z3490 Encounter for supervision of normal pregnancy, unspecified, unspecified trimester: Secondary | ICD-10-CM | POA: Insufficient documentation

## 2018-08-20 ENCOUNTER — Other Ambulatory Visit: Payer: Self-pay

## 2018-08-20 ENCOUNTER — Ambulatory Visit (INDEPENDENT_AMBULATORY_CARE_PROVIDER_SITE_OTHER): Payer: Self-pay | Admitting: *Deleted

## 2018-08-20 DIAGNOSIS — Z3490 Encounter for supervision of normal pregnancy, unspecified, unspecified trimester: Secondary | ICD-10-CM

## 2018-08-20 NOTE — Progress Notes (Signed)
I have reviewed the chart and agree with nursing staff's documentation of this patient's encounter.  Silas Sacramento, MD 08/20/2018 2:25 PM

## 2018-08-20 NOTE — Progress Notes (Signed)
I called [facility] for new ob intake . She informed me she terminated the pregnancy on 07/30/18. She states she has been having intermittent right lower pain=8 and bleeds when she hurts. States she had the worst pain when she tried to have sex.  She also states was told when she had the [place] she had a fibroid.  We discussed US shows simple cyst, not fibroid.  I advised her may take ibuprofen as needed for pain- also if pain severe to go to [facility] ED.  I informed her I will discuss with provider and call her back to decide plan of care.   I discussed with Dr. Gala Romney and called patient back and informed her we recommend she take a home pregnancy test because we are concerned for retained products of conception. I advised her to call us and let us know if it negative or positive and then we will schedule a televisit with provider.  We also discussed if negative today, to repeat in am on first urine. She states she doesn't have a home pregnancy test but will go buy one and do the test. We discussed we do close at 12 today. She states she will call and leave a message- I also repeated if she has severe pain to go to [facility] ED. She voices understanding.  Simisola Sandles,RN

## 2018-08-30 ENCOUNTER — Encounter: Payer: Medicaid Other | Admitting: Obstetrics and Gynecology

## 2018-11-21 ENCOUNTER — Other Ambulatory Visit: Payer: Self-pay

## 2018-11-21 ENCOUNTER — Inpatient Hospital Stay (HOSPITAL_COMMUNITY): Payer: Medicaid Other

## 2018-11-21 ENCOUNTER — Inpatient Hospital Stay (HOSPITAL_COMMUNITY)
Admission: AD | Admit: 2018-11-21 | Discharge: 2018-11-21 | Disposition: A | Discharge status: Home / Self Care | Payer: Medicaid Other | Source: Ambulatory Visit | Attending: Family Medicine | Admitting: Family Medicine

## 2018-11-21 ENCOUNTER — Encounter (HOSPITAL_COMMUNITY): Payer: Self-pay | Admitting: Student

## 2018-11-21 DIAGNOSIS — A5901 Trichomonal vulvovaginitis: Secondary | ICD-10-CM | POA: Insufficient documentation

## 2018-11-21 DIAGNOSIS — O98311 Other infections with a predominantly sexual mode of transmission complicating pregnancy, first trimester: Secondary | ICD-10-CM | POA: Insufficient documentation

## 2018-11-21 DIAGNOSIS — N83209 Unspecified ovarian cyst, unspecified side: Secondary | ICD-10-CM

## 2018-11-21 DIAGNOSIS — Z3491 Encounter for supervision of normal pregnancy, unspecified, first trimester: Secondary | ICD-10-CM

## 2018-11-21 DIAGNOSIS — R109 Unspecified abdominal pain: Secondary | ICD-10-CM

## 2018-11-21 DIAGNOSIS — O23591 Infection of other part of genital tract in pregnancy, first trimester: Secondary | ICD-10-CM

## 2018-11-21 DIAGNOSIS — O219 Vomiting of pregnancy, unspecified: Secondary | ICD-10-CM | POA: Insufficient documentation

## 2018-11-21 DIAGNOSIS — O468X1 Other antepartum hemorrhage, first trimester: Secondary | ICD-10-CM

## 2018-11-21 DIAGNOSIS — F17210 Nicotine dependence, cigarettes, uncomplicated: Secondary | ICD-10-CM | POA: Insufficient documentation

## 2018-11-21 DIAGNOSIS — Z8249 Family history of ischemic heart disease and other diseases of the circulatory system: Secondary | ICD-10-CM | POA: Insufficient documentation

## 2018-11-21 DIAGNOSIS — O3481 Maternal care for other abnormalities of pelvic organs, first trimester: Secondary | ICD-10-CM | POA: Insufficient documentation

## 2018-11-21 DIAGNOSIS — O99331 Smoking (tobacco) complicating pregnancy, first trimester: Secondary | ICD-10-CM | POA: Insufficient documentation

## 2018-11-21 DIAGNOSIS — O208 Other hemorrhage in early pregnancy: Secondary | ICD-10-CM | POA: Insufficient documentation

## 2018-11-21 DIAGNOSIS — N83202 Unspecified ovarian cyst, left side: Secondary | ICD-10-CM | POA: Insufficient documentation

## 2018-11-21 DIAGNOSIS — O418X10 Other specified disorders of amniotic fluid and membranes, first trimester, not applicable or unspecified: Secondary | ICD-10-CM

## 2018-11-21 DIAGNOSIS — O26891 Other specified pregnancy related conditions, first trimester: Secondary | ICD-10-CM | POA: Insufficient documentation

## 2018-11-21 DIAGNOSIS — Z3A08 8 weeks gestation of pregnancy: Secondary | ICD-10-CM | POA: Insufficient documentation

## 2018-11-21 HISTORY — DX: Trichomonal vulvovaginitis: A59.01

## 2018-11-21 HISTORY — DX: Infection of other part of genital tract in pregnancy, first trimester: O23.591

## 2018-11-21 LAB — CBC
HCT: 34.7 % — ABNORMAL LOW (ref 36.0–46.0)
Hemoglobin: 12.1 g/dL (ref 12.0–15.0)
MCH: 31.1 pg (ref 26.0–34.0)
MCHC: 34.9 g/dL (ref 30.0–36.0)
MCV: 89.2 fL (ref 80.0–100.0)
Platelets: 256 10*3/uL (ref 150–400)
RBC: 3.89 MIL/uL (ref 3.87–5.11)
RDW: 11.9 % (ref 11.5–15.5)
WBC: 11.6 10*3/uL — ABNORMAL HIGH (ref 4.0–10.5)
nRBC: 0 % (ref 0.0–0.2)

## 2018-11-21 LAB — URINALYSIS, MICROSCOPIC (REFLEX)

## 2018-11-21 LAB — URINALYSIS, ROUTINE W REFLEX MICROSCOPIC
Bilirubin Urine: NEGATIVE
Glucose, UA: NEGATIVE mg/dL
Ketones, ur: NEGATIVE mg/dL
Nitrite: NEGATIVE
Protein, ur: NEGATIVE mg/dL
Specific Gravity, Urine: 1.02 (ref 1.005–1.030)
pH: 8 (ref 5.0–8.0)

## 2018-11-21 LAB — WET PREP, GENITAL
Sperm: NONE SEEN
Yeast Wet Prep HPF POC: NONE SEEN

## 2018-11-21 LAB — HCG, QUANTITATIVE, PREGNANCY: hCG, Beta Chain, Quant, S: 243220 m[IU]/mL — ABNORMAL HIGH (ref <5)

## 2018-11-21 LAB — POCT PREGNANCY, URINE: Preg Test, Ur: POSITIVE — AB

## 2018-11-21 MED ORDER — METRONIDAZOLE 500 MG PO TABS
2000.0000 mg | ORAL_TABLET | Freq: Once | ORAL | Status: AC
  Administered 2018-11-21: 2000 mg via ORAL
  Filled 2018-11-21: qty 4

## 2018-11-21 MED ORDER — PROMETHAZINE HCL 25 MG/ML IJ SOLN
25.0000 mg | Freq: Once | INTRAMUSCULAR | Status: AC
  Administered 2018-11-21: 25 mg via INTRAMUSCULAR
  Filled 2018-11-21: qty 1

## 2018-11-21 MED ORDER — PROMETHAZINE HCL 25 MG PO TABS: 25.0000 mg | ORAL_TABLET | Freq: Four times a day (QID) | ORAL | 0 refills | Status: DC | PRN

## 2018-11-21 NOTE — MAU Note (Signed)
Paula Gilbert is a 28 y.o. here in MAU reporting: states she has been vomiting for 2 weeks, unable to keep anything down. States she has been trying to keep herself hydrated, states sometimes the liquids stay down. Been having back since yesterday. No vaginal bleeding. States she has been having abnormal discharge for 2 weeks, it is white and creamy, states sometimes it has a foul odor. Also having a burning sensation in her stomach. States she smokes marijuana, last use was today.  LMP:  Unknown, thinks she had a period in May, +UPT in June  Onset of complaint: ongoing  Pain score: back pain 4/10, abdominal pain 7/10  Vitals:   11/21/18 1241  BP: 110/67  Pulse: 79  Resp: 16  Temp: 98.6 F (37 C)  SpO2: 100%      Lab orders placed from triage: UA, UPT

## 2018-11-21 NOTE — Discharge Instructions (Signed)
Morning Sickness  Morning sickness is when a woman feels nauseous during pregnancy. This nauseous feeling may or may not come with vomiting. It often occurs in the morning, but it can be a problem at any time of day. Morning sickness is most common during the first trimester. In some cases, it may continue throughout pregnancy. Although morning sickness is unpleasant, it is usually harmless unless the woman develops severe and continual vomiting (hyperemesis gravidarum), a condition that requires more intense treatment. What are the causes? The exact cause of this condition is not known, but it seems to be related to normal hormonal changes that occur in pregnancy. What increases the risk? You are more likely to develop this condition if:  You experienced nausea or vomiting before your pregnancy.  You had morning sickness during a previous pregnancy.  You are pregnant with more than one baby, such as twins. What are the signs or symptoms? Symptoms of this condition include:  Nausea.  Vomiting. How is this diagnosed? This condition is usually diagnosed based on your signs and symptoms. How is this treated? In many cases, treatment is not needed for this condition. Making some changes to what you eat may help to control symptoms. Your health care provider may also prescribe or recommend:  Vitamin B6 supplements.  Anti-nausea medicines.  Ginger. Follow these instructions at home: Medicines  Take over-the-counter and prescription medicines only as told by your health care provider. Do not use any prescription, over-the-counter, or herbal medicines for morning sickness without first talking with your health care provider.  Taking multivitamins before getting pregnant can prevent or decrease the severity of morning sickness in most women. Eating and drinking  Eat a piece of dry toast or crackers before getting out of bed in the morning.  Eat 5 or 6 small meals a day.  Eat dry and  bland foods, such as rice or a baked potato. Foods that are high in carbohydrates are often helpful.  Avoid greasy, fatty, and spicy foods.  Have someone cook for you if the smell of any food causes nausea and vomiting.  If you feel nauseous after taking prenatal vitamins, take the vitamins at night or with a snack.  Snack on protein foods between meals if you are hungry. Nuts, yogurt, and cheese are good options.  Drink fluids throughout the day.  Try ginger ale made with real ginger, ginger tea made from fresh grated ginger, or ginger candies. General instructions  Do not use any products that contain nicotine or tobacco, such as cigarettes and e-cigarettes. If you need help quitting, ask your health care provider.  Get an air purifier to keep the air in your house free of odors.  Get plenty of fresh air.  Try to avoid odors that trigger your nausea.  Consider trying these methods to help relieve symptoms: ? Wearing an acupressure wristband. These wristbands are often worn for seasickness. ? Acupuncture. Contact a health care provider if:  Your home remedies are not working and you need medicine.  You feel dizzy or light-headed.  You are losing weight. Get help right away if:  You have persistent and uncontrolled nausea and vomiting.  You faint.  You have severe pain in your abdomen. Summary  Morning sickness is when a woman feels nauseous during pregnancy. This nauseous feeling may or may not come with vomiting.  Morning sickness is most common during the first trimester.  It often occurs in the morning, but it can be a problem at  any time of day.  In many cases, treatment is not needed for this condition. Making some changes to what you eat may help to control symptoms. This information is not intended to replace advice given to you by your health care provider. Make sure you discuss any questions you have with your health care provider. Document Released:  06/12/2006 Document Revised: 04/03/2017 Document Reviewed: 05/24/2016 Elsevier Patient Education  2020 [place] Hematoma  A subchorionic hematoma is a gathering of blood between the outer wall of the embryo (chorion) and the inner wall of the womb (uterus). This condition can cause vaginal bleeding. If they cause little or no vaginal bleeding, early small hematomas usually shrink on their own and do not affect your baby or pregnancy. When bleeding starts later in pregnancy, or if the hematoma is larger or occurs in older pregnant women, the condition may be more serious. Larger hematomas may get bigger, which increases the chances of miscarriage. This condition also increases the risk of:  Premature separation of the placenta from the uterus.  Premature (preterm) labor.  Stillbirth. What are the causes? The exact cause of this condition is not known. It occurs when blood is trapped between the placenta and the uterine wall because the placenta has separated from the original site of implantation. What increases the risk? You are more likely to develop this condition if:  You were treated with fertility medicines.  You conceived through in vitro fertilization (IVF). What are the signs or symptoms? Symptoms of this condition include:  Vaginal spotting or bleeding.  Contractions of the uterus. These cause abdominal pain. Sometimes you may have no symptoms and the bleeding may only be seen when ultrasound images are taken (transvaginal ultrasound). How is this diagnosed? This condition is diagnosed based on a physical exam. This includes a pelvic exam. You may also have other tests, including:  Blood tests.  Urine tests.  Ultrasound of the abdomen. How is this treated? Treatment for this condition can vary. Treatment may include:  Watchful waiting. You will be monitored closely for any changes in bleeding. During this stage: ? The hematoma may be  reabsorbed by the body. ? The hematoma may separate the fluid-filled space containing the embryo (gestational sac) from the wall of the womb (endometrium).  Medicines.  Activity restriction. This may be needed until the bleeding stops. Follow these instructions at home:  Stay on bed rest if told to do so by your health care provider.  Do not lift anything that is heavier than 10 lbs. (4.5 kg) or as told by your health care provider.  Do not use any products that contain nicotine or tobacco, such as cigarettes and e-cigarettes. If you need help quitting, ask your health care provider.  Track and write down the number of pads you use each day and how soaked (saturated) they are.  Do not use tampons.  Keep all follow-up visits as told by your health care provider. This is important. Your health care provider may ask you to have follow-up blood tests or ultrasound tests or both. Contact a health care provider if:  You have any vaginal bleeding.  You have a fever. Get help right away if:  You have severe cramps in your stomach, back, abdomen, or pelvis.  You pass large clots or tissue. Save any tissue for your health care provider to look at.  You have more vaginal bleeding, and you faint or become lightheaded or weak. Summary  A subchorionic hematoma is a gathering of blood between the outer wall of the placenta and the uterus.  This condition can cause vaginal bleeding.  Sometimes you may have no symptoms and the bleeding may only be seen when ultrasound images are taken.  Treatment may include watchful waiting, medicines, or activity restriction. This information is not intended to replace advice given to you by your health care provider. Make sure you discuss any questions you have with your health care provider. Document Released: 08/06/2006 Document Revised: 04/03/2017 Document Reviewed: 06/17/2016 Elsevier Patient Education  2020 [employer].
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
## 2018-11-22 ENCOUNTER — Inpatient Hospital Stay (HOSPITAL_COMMUNITY)
Admission: AD | Admit: 2018-11-22 | Discharge: 2018-11-22 | Disposition: A | Discharge status: Home / Self Care | Payer: Medicaid Other | Attending: Obstetrics & Gynecology | Admitting: Obstetrics & Gynecology

## 2018-11-22 ENCOUNTER — Encounter (HOSPITAL_COMMUNITY): Payer: Self-pay

## 2018-11-22 DIAGNOSIS — R1030 Lower abdominal pain, unspecified: Secondary | ICD-10-CM | POA: Insufficient documentation

## 2018-11-22 DIAGNOSIS — Z3A08 8 weeks gestation of pregnancy: Secondary | ICD-10-CM | POA: Insufficient documentation

## 2018-11-22 DIAGNOSIS — O219 Vomiting of pregnancy, unspecified: Secondary | ICD-10-CM

## 2018-11-22 DIAGNOSIS — G4700 Insomnia, unspecified: Secondary | ICD-10-CM | POA: Insufficient documentation

## 2018-11-22 DIAGNOSIS — O99331 Smoking (tobacco) complicating pregnancy, first trimester: Secondary | ICD-10-CM | POA: Insufficient documentation

## 2018-11-22 DIAGNOSIS — F17210 Nicotine dependence, cigarettes, uncomplicated: Secondary | ICD-10-CM | POA: Insufficient documentation

## 2018-11-22 DIAGNOSIS — O99351 Diseases of the nervous system complicating pregnancy, first trimester: Secondary | ICD-10-CM | POA: Insufficient documentation

## 2018-11-22 DIAGNOSIS — R6883 Chills (without fever): Secondary | ICD-10-CM | POA: Insufficient documentation

## 2018-11-22 DIAGNOSIS — O26891 Other specified pregnancy related conditions, first trimester: Secondary | ICD-10-CM | POA: Insufficient documentation

## 2018-11-22 DIAGNOSIS — M549 Dorsalgia, unspecified: Secondary | ICD-10-CM | POA: Insufficient documentation

## 2018-11-22 HISTORY — DX: Vomiting of pregnancy, unspecified: O21.9

## 2018-11-22 LAB — URINALYSIS, ROUTINE W REFLEX MICROSCOPIC
Bilirubin Urine: NEGATIVE
Glucose, UA: NEGATIVE mg/dL
Ketones, ur: 80 mg/dL — AB
Nitrite: NEGATIVE
Protein, ur: 30 mg/dL — AB
Specific Gravity, Urine: 1.034 — ABNORMAL HIGH (ref 1.005–1.030)
pH: 5 (ref 5.0–8.0)

## 2018-11-22 LAB — CULTURE, OB URINE: Culture: NO GROWTH

## 2018-11-22 MED ORDER — M.V.I. ADULT IV INJ
Freq: Once | INTRAVENOUS | Status: AC
  Administered 2018-11-22: 12:00:00 via INTRAVENOUS
  Filled 2018-11-22: qty 1000

## 2018-11-22 MED ORDER — SODIUM CHLORIDE 0.9 % IV SOLN
8.0000 mg | Freq: Once | INTRAVENOUS | Status: AC
  Administered 2018-11-22: 8 mg via INTRAVENOUS
  Filled 2018-11-22: qty 4

## 2018-11-22 MED ORDER — ONDANSETRON 4 MG PO TBDP: 4.0000 mg | ORAL_TABLET | Freq: Three times a day (TID) | ORAL | 0 refills | Status: DC | PRN

## 2018-11-22 NOTE — Discharge Instructions (Signed)
Morning Sickness  Morning sickness is when a woman feels nauseous during pregnancy. This nauseous feeling may or may not come with vomiting. It often occurs in the morning, but it can be a problem at any time of day. Morning sickness is most common during the first trimester. In some cases, it may continue throughout pregnancy. Although morning sickness is unpleasant, it is usually harmless unless the woman develops severe and continual vomiting (hyperemesis gravidarum), a condition that requires more intense treatment. What are the causes? The exact cause of this condition is not known, but it seems to be related to normal hormonal changes that occur in pregnancy. What increases the risk? You are more likely to develop this condition if:  You experienced nausea or vomiting before your pregnancy.  You had morning sickness during a previous pregnancy.  You are pregnant with more than one baby, such as twins. What are the signs or symptoms? Symptoms of this condition include:  Nausea.  Vomiting. How is this diagnosed? This condition is usually diagnosed based on your signs and symptoms. How is this treated? In many cases, treatment is not needed for this condition. Making some changes to what you eat may help to control symptoms. Your health care provider may also prescribe or recommend:  Vitamin B6 supplements.  Anti-nausea medicines.  Ginger. Follow these instructions at home: Medicines  Take over-the-counter and prescription medicines only as told by your health care provider. Do not use any prescription, over-the-counter, or herbal medicines for morning sickness without first talking with your health care provider.  Taking multivitamins before getting pregnant can prevent or decrease the severity of morning sickness in most women. Eating and drinking  Eat a piece of dry toast or crackers before getting out of bed in the morning.  Eat 5 or 6 small meals a day.  Eat dry and  bland foods, such as rice or a baked potato. Foods that are high in carbohydrates are often helpful.  Avoid greasy, fatty, and spicy foods.  Have someone cook for you if the smell of any food causes nausea and vomiting.  If you feel nauseous after taking prenatal vitamins, take the vitamins at night or with a snack.  Snack on protein foods between meals if you are hungry. Nuts, yogurt, and cheese are good options.  Drink fluids throughout the day.  Try ginger ale made with real ginger, ginger tea made from fresh grated ginger, or ginger candies. General instructions  Do not use any products that contain nicotine or tobacco, such as cigarettes and e-cigarettes. If you need help quitting, ask your health care provider.  Get an air purifier to keep the air in your house free of odors.  Get plenty of fresh air.  Try to avoid odors that trigger your nausea.  Consider trying these methods to help relieve symptoms: ? Wearing an acupressure wristband. These wristbands are often worn for seasickness. ? Acupuncture. Contact a health care provider if:  Your home remedies are not working and you need medicine.  You feel dizzy or light-headed.  You are losing weight. Get help right away if:  You have persistent and uncontrolled nausea and vomiting.  You faint.  You have severe pain in your abdomen. Summary  Morning sickness is when a woman feels nauseous during pregnancy. This nauseous feeling may or may not come with vomiting.  Morning sickness is most common during the first trimester.  It often occurs in the morning, but it can be a problem at  any time of day.  In many cases, treatment is not needed for this condition. Making some changes to what you eat may help to control symptoms. This information is not intended to replace advice given to you by your health care provider. Make sure you discuss any questions you have with your health care provider. Document Released:  06/12/2006 Document Revised: 04/03/2017 Document Reviewed: 05/24/2016 Elsevier Patient Education  2020 [place].   Hyperemesis Gravidarum Hyperemesis gravidarum is a severe form of nausea and vomiting that happens during pregnancy. Hyperemesis is worse than morning sickness. It may cause you to have nausea or vomiting all day for many days. It may keep you from eating and drinking enough food and liquids, which can lead to dehydration, malnutrition, and weight loss. Hyperemesis usually occurs during the first half (the first 20 weeks) of pregnancy. It often goes away once a woman is in her second half of pregnancy. However, sometimes hyperemesis continues through an entire pregnancy. What are the causes? The cause of this condition is not known. It may be related to changes in chemicals (hormones) in the body during pregnancy, such as the high level of pregnancy hormone (human chorionic gonadotropin) or the increase in the female sex hormone (estrogen). What are the signs or symptoms? Symptoms of this condition include:  Nausea that does not go away.  Vomiting that does not allow you to keep any food down.  Weight loss.  Body fluid loss (dehydration).  Having no desire to eat, or not liking food that you have previously enjoyed. How is this diagnosed? This condition may be diagnosed based on:  A physical exam.  Your medical history.  Your symptoms.  Blood tests.  Urine tests. How is this treated? This condition is managed by controlling symptoms. This may include:  Following an eating plan. This can help lessen nausea and vomiting.  Taking prescription medicines. An eating plan and medicines are often used together to help control symptoms. If medicines do not help relieve nausea and vomiting, you may need to receive fluids through an IV at the hospital. Follow these instructions at home: Eating and drinking   Avoid the following: ? Drinking fluids with meals. Try not  to drink anything during the 30 minutes before and after your meals. ? Drinking more than 1 cup of fluid at a time. ? Eating foods that trigger your symptoms. These may include spicy foods, coffee, high-fat foods, very sweet foods, and acidic foods. ? Skipping meals. Nausea can be more intense on an empty stomach. If you cannot tolerate food, do not force it. Try sucking on ice chips or other frozen items and make up for missed calories later. ? Lying down within 2 hours after eating. ? Being exposed to environmental triggers. These may include food smells, smoky rooms, closed spaces, rooms with strong smells, warm or humid places, overly loud and noisy rooms, and rooms with motion or flickering lights. Try eating meals in a well-ventilated area that is free of strong smells. ? Quick and sudden changes in your movement. ? Taking iron pills and multivitamins that contain iron. If you take prescription iron pills, do not stop taking them unless your health care provider approves. ? Preparing food. The smell of food can spoil your appetite or trigger nausea.  To help relieve your symptoms: ? Listen to your body. Everyone is different and has different preferences. Find what works best for you. ? Eat and drink slowly. ? Eat 5-6 small meals daily instead  of 3 large meals. Eating small meals and snacks can help you avoid an empty stomach. ? In the morning, before getting out of bed, eat a couple of crackers to avoid moving around on an empty stomach. ? Try eating starchy foods as these are usually tolerated well. Examples include cereal, toast, bread, potatoes, pasta, rice, and pretzels. ? Include at least 1 serving of protein with your meals and snacks. Protein options include lean meats, poultry, seafood, beans, nuts, nut butters, eggs, cheese, and yogurt. ? Try eating a protein-rich snack before bed. Examples of a protein-rick snack include cheese and crackers or a peanut butter sandwich made with 1  slice of whole-wheat bread and 1 tsp (5 g) of peanut butter. ? Eat or suck on things that have ginger in them. It may help relieve nausea. Add  tsp ground ginger to hot tea or choose ginger tea. ? Try drinking 100% fruit juice or an electrolyte drink. An electrolyte drink contains sodium, potassium, and chloride. ? Drink fluids that are cold, clear, and carbonated or sour. Examples include lemonade, ginger ale, lemon-lime soda, ice water, and sparkling water. ? Brush your teeth or use a mouth rinse after meals. ? Talk with your health care provider about starting a supplement of vitamin B6. General instructions  Take over-the-counter and prescription medicines only as told by your health care provider.  Follow instructions from your health care provider about eating or drinking restrictions.  Continue to take your prenatal vitamins as told by your health care provider. If you are having trouble taking your prenatal vitamins, talk with your health care provider about different options.  Keep all follow-up and pre-birth (prenatal) visits as told by your health care provider. This is important. Contact a health care provider if:  You have pain in your abdomen.  You have a severe headache.  You have vision problems.  You are losing weight.  You feel weak or dizzy. Get help right away if:  You cannot drink fluids without vomiting.  You vomit blood.  You have constant nausea and vomiting.  You are very weak.  You faint.  You have a fever and your symptoms suddenly get worse. Summary  Hyperemesis gravidarum is a severe form of nausea and vomiting that happens during pregnancy.  Making some changes to your eating habits may help relieve nausea and vomiting.  This condition may be managed with medicine.  If medicines do not help relieve nausea and vomiting, you may need to receive fluids through an IV at the hospital. This information is not intended to replace advice given to  you by your health care provider. Make sure you discuss any questions you have with your health care provider. Document Released: 04/21/2005 Document Revised: 05/11/2017 Document Reviewed: 12/19/2015 Elsevier Patient Education  2020 [employer].
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
## 2018-11-22 NOTE — MAU Note (Addendum)
.   Paula Gilbert is a 28 y.o. at [redacted]w[redacted]d here in MAU reporting: that she was evaluated in MAU yesterday for the same complaints. Lower abdomen pain, abdominal pain, chills, and vomiting.   Onset of complaint: 3 weeks ago Pain score: 7 Vitals:   11/22/18 1016  BP: 114/73  Pulse: 87  Resp: 16  Temp: 98.4 F (36.9 C)  SpO2: 99%    Lab orders placed from triage: UA

## 2018-11-24 LAB — GC/CHLAMYDIA PROBE AMP (~~LOC~~) NOT AT ARMC
Chlamydia: POSITIVE — AB
Neisseria Gonorrhea: NEGATIVE

## 2018-11-27 ENCOUNTER — Telehealth: Payer: Self-pay | Admitting: Student

## 2018-11-27 DIAGNOSIS — A749 Chlamydial infection, unspecified: Secondary | ICD-10-CM

## 2018-11-27 MED ORDER — AZITHROMYCIN 500 MG PO TABS: 1000.0000 mg | ORAL_TABLET | Freq: Once | ORAL | 0 refills | Status: AC

## 2018-11-27 NOTE — Telephone Encounter (Addendum)
[name] tested positive for  Chlamydia. Patient was called by RN and allergies and pharmacy confirmed. Rx sent to pharmacy of choice.   Jorje Guild, NP 11/27/2018 7:57 AM        ----- Message from Bjorn Loser, RN sent at 11/26/2018 11:08 AM EDT ----- This patient tested positive for :  Chlamydia  She "has NKDA", I have informed the patient of her results and confirmed her pharmacy is correct in her chart. Please send Rx.   Thank you,   Bjorn Loser, RN   Results faxed to [facility] Department.

## 2018-12-08 ENCOUNTER — Telehealth: Payer: Self-pay | Admitting: Obstetrics & Gynecology

## 2018-12-08 NOTE — Telephone Encounter (Signed)
Called the patient to inform of upcoming visit and assist with downloading mychart. Left a detailed voicemail informing of the patient of calling our office regarding information to complete appointment.

## 2018-12-09 ENCOUNTER — Ambulatory Visit: Payer: Medicaid Other | Admitting: General Practice

## 2018-12-09 ENCOUNTER — Other Ambulatory Visit: Payer: Self-pay

## 2018-12-09 ENCOUNTER — Ambulatory Visit (INDEPENDENT_AMBULATORY_CARE_PROVIDER_SITE_OTHER): Payer: Self-pay

## 2018-12-09 DIAGNOSIS — A749 Chlamydial infection, unspecified: Secondary | ICD-10-CM

## 2018-12-09 DIAGNOSIS — Z8759 Personal history of other complications of pregnancy, childbirth and the puerperium: Secondary | ICD-10-CM

## 2018-12-09 DIAGNOSIS — O98811 Other maternal infectious and parasitic diseases complicating pregnancy, first trimester: Secondary | ICD-10-CM

## 2018-12-09 DIAGNOSIS — O219 Vomiting of pregnancy, unspecified: Secondary | ICD-10-CM

## 2018-12-09 DIAGNOSIS — N898 Other specified noninflammatory disorders of vagina: Secondary | ICD-10-CM

## 2018-12-09 DIAGNOSIS — O0990 Supervision of high risk pregnancy, unspecified, unspecified trimester: Secondary | ICD-10-CM

## 2018-12-09 HISTORY — DX: Chlamydial infection, unspecified: A74.9

## 2018-12-09 MED ORDER — BLOOD PRESSURE KIT DEVI: 1.0000 | Freq: Once | 0 refills | Status: AC

## 2018-12-09 NOTE — Progress Notes (Signed)
Patient seen and assessed by nursing staff during this encounter. I have reviewed the chart and agree with the documentation and plan.  Verita Schneiders, MD 12/09/2018 5:11 PM

## 2018-12-09 NOTE — Progress Notes (Signed)
Pt here today for vaginal swab with c/o thick white vaginal discharge.  Per chart review pt was tested on 11/21/18 resulting in +Trich, +BV.  I explained to the pt that it seems like she has a yeast infection since completing antibiotics.  I advised pt to purchase Monistat over the counter for the yeast infection.  Pt then c/o N/V and that is not able to keep anything down.  Pt was prescribed Zofran and Phenergan.  Pt reports only picking up the Phenergan because she was told that the other medication could cause her to have constipation.  I advised the pt that she can take Colace 100 mg table bid to help prevent constipation.  I advised pt that she should continue to eat what she can keep down and to possibly take the Zofran 30 min prior to eating.  Pt verbalized understanding with no further questions.   Mel Almond, RN 12/09/18

## 2018-12-09 NOTE — Patient Instructions (Signed)
Pick up Monistat for yeast infection from over the counter Colace 100 mg tablet to prevent constipation over the counter Pick up Zofran from pharmacy Stick to what works with eating

## 2018-12-09 NOTE — Progress Notes (Signed)
I agree with the nurses note and plan of care.   Noni Saupe I, NP 12/09/2018 1:13 PM

## 2018-12-09 NOTE — Progress Notes (Signed)
I connected with  Paula Gilbert on 12/09/18 at  2:30 PM EDT by telephone and verified that I am speaking with the correct person using two identifiers.   I discussed the limitations, risks, security and privacy concerns of performing an evaluation and management service by telephone and the availability of in person appointments. I also discussed with the patient that there may be a patient responsible charge related to this service. The patient expressed understanding and agreed to proceed.  New OB intake completed today. History significant for pre-eclampsia. Patient does not have access to a blood pressure cuff- discussed [facility] will call her regarding a cuff. Also signed patient up for babyscripts and discussed expectation of logging blood pressures weekly and to bring cuff to appt. Patient will need OB Panel, urine culture, Panorama/Horizon (does not need Hgb electrophoresis), fasting CBG, CMP, urine prot/creat ratio, pap, test of cure for trichomonas & chlamydia infection, & medicaid home form completed. Patient will follow up on 8/17 for OB visit.  Derinda Late, RN 12/09/2018  5:03 PM

## 2018-12-19 ENCOUNTER — Inpatient Hospital Stay (HOSPITAL_COMMUNITY)
Admission: AD | Admit: 2018-12-19 | Discharge: 2018-12-19 | Discharge status: Against Medical Advice | Payer: Medicaid Other | Attending: Obstetrics and Gynecology | Admitting: Obstetrics and Gynecology

## 2018-12-19 ENCOUNTER — Other Ambulatory Visit: Payer: Self-pay

## 2018-12-19 DIAGNOSIS — R109 Unspecified abdominal pain: Secondary | ICD-10-CM | POA: Insufficient documentation

## 2018-12-19 DIAGNOSIS — Z5329 Procedure and treatment not carried out because of patient's decision for other reasons: Secondary | ICD-10-CM | POA: Insufficient documentation

## 2018-12-19 LAB — URINALYSIS, ROUTINE W REFLEX MICROSCOPIC
Bilirubin Urine: NEGATIVE
Glucose, UA: NEGATIVE mg/dL
Hgb urine dipstick: NEGATIVE
Ketones, ur: NEGATIVE mg/dL
Leukocytes,Ua: NEGATIVE
Nitrite: NEGATIVE
Protein, ur: NEGATIVE mg/dL
Specific Gravity, Urine: 1.016 (ref 1.005–1.030)
pH: 8 (ref 5.0–8.0)

## 2018-12-19 NOTE — MAU Note (Signed)
Paula Gilbert is a 28 y.o. at [redacted]w[redacted]d here in MAU reporting: having sharp upper abdominal pain since 0730, states pain has gotten worse. Unsure about bleeding or discharge.  Onset of complaint: this morning  Pain score: 9/10  Vitals:   12/19/18 0934  BP: 111/73  Pulse: 84  Resp: 18  Temp: 97.7 F (36.5 C)  SpO2: 100%     FHT:159  Lab orders placed from triage: UA

## 2018-12-19 NOTE — MAU Note (Signed)
Brought pt to MAU room 122. Pt states pain is gone and is planning to leave. RN informs pt we recommend she stay and be evaluated but pt states she will just go to her appointment tomorrow morning. Pt signs AMA form.

## 2018-12-20 ENCOUNTER — Other Ambulatory Visit (HOSPITAL_COMMUNITY)
Admission: RE | Admit: 2018-12-20 | Discharge: 2018-12-20 | Disposition: A | Discharge status: Home / Self Care | Payer: Medicaid Other | Source: Ambulatory Visit | Attending: Obstetrics & Gynecology | Admitting: Obstetrics & Gynecology

## 2018-12-20 ENCOUNTER — Encounter: Payer: Self-pay | Admitting: Obstetrics & Gynecology

## 2018-12-20 ENCOUNTER — Ambulatory Visit (INDEPENDENT_AMBULATORY_CARE_PROVIDER_SITE_OTHER): Payer: Self-pay | Admitting: Obstetrics & Gynecology

## 2018-12-20 VITALS — BP 126/78 | HR 85 | Wt 120.9 lb

## 2018-12-20 DIAGNOSIS — O468X1 Other antepartum hemorrhage, first trimester: Secondary | ICD-10-CM

## 2018-12-20 DIAGNOSIS — O0990 Supervision of high risk pregnancy, unspecified, unspecified trimester: Secondary | ICD-10-CM | POA: Diagnosis not present

## 2018-12-20 DIAGNOSIS — O418X11 Other specified disorders of amniotic fluid and membranes, first trimester, fetus 1: Secondary | ICD-10-CM

## 2018-12-20 DIAGNOSIS — O208 Other hemorrhage in early pregnancy: Secondary | ICD-10-CM | POA: Insufficient documentation

## 2018-12-20 DIAGNOSIS — O418X10 Other specified disorders of amniotic fluid and membranes, first trimester, not applicable or unspecified: Secondary | ICD-10-CM | POA: Insufficient documentation

## 2018-12-20 DIAGNOSIS — O0991 Supervision of high risk pregnancy, unspecified, first trimester: Secondary | ICD-10-CM

## 2018-12-20 DIAGNOSIS — O99331 Smoking (tobacco) complicating pregnancy, first trimester: Secondary | ICD-10-CM

## 2018-12-20 DIAGNOSIS — O219 Vomiting of pregnancy, unspecified: Secondary | ICD-10-CM

## 2018-12-20 DIAGNOSIS — Z3A12 12 weeks gestation of pregnancy: Secondary | ICD-10-CM

## 2018-12-20 DIAGNOSIS — O209 Hemorrhage in early pregnancy, unspecified: Secondary | ICD-10-CM

## 2018-12-20 DIAGNOSIS — Z8759 Personal history of other complications of pregnancy, childbirth and the puerperium: Secondary | ICD-10-CM

## 2018-12-20 HISTORY — DX: Smoking (tobacco) complicating pregnancy, first trimester: O99.331

## 2018-12-20 MED ORDER — POLYETHYLENE GLYCOL 3350 17 G PO PACK: 17.0000 g | PACK | Freq: Every day | ORAL | 0 refills | Status: DC

## 2018-12-20 MED ORDER — DOXYLAMINE-PYRIDOXINE 10-10 MG PO TBEC: 2.0000 | DELAYED_RELEASE_TABLET | Freq: Every day | ORAL | 0 refills | Status: DC

## 2018-12-20 MED ORDER — PRENATAL 27-0.8 MG PO TABS: 1.0000 | ORAL_TABLET | Freq: Every day | ORAL | 0 refills | Status: DC

## 2018-12-20 MED ORDER — ASPIRIN EC 81 MG PO TBEC: 81.0000 mg | DELAYED_RELEASE_TABLET | Freq: Every day | ORAL | 3 refills | Status: DC

## 2018-12-20 NOTE — Patient Instructions (Addendum)
AREA PEDIATRIC/FAMILY PRACTICE PHYSICIANS  Central/Southeast [place] [phone]) . [facility] Davy Pique, MD; Gwendlyn Deutscher, MD; Walker Kehr, MD; Andria Frames, MD; McDiarmid, MD; Dutch Quint, MD; Nori Riis, MD; Mingo Amber, [address] [place], [place] [address] o [phone] o Mon-Fri 8:30-12:30, 1:30-5:00 o Providers come to see babies at [facility] o Accepting Medicaid . [place] [facility] [place] providers who accept newborns: Dorthy Cooler, MD; Orland Mustard, MD; Stephanie Acre, MD o [place], [place], [place] [address] o [phone] o Mon-Fri 8:00-5:30 o Babies seen by providers at [facility] o Does NOT accept Medicaid o Please call early in hospitalization for appointment (limited availability)  . Mustard [place], MD o [address], [place], [place] [address] o [phone] o Mon, Tue, Thur, Fri 8:30-5:00, Wed 10:00-7:00 (closed 1-2pm) o Babies seen by [facility] providers o Accepting Medicaid . [place], MD o Center [place], [place], [place] [address] o [phone] o Mon-Fri 8:30-5:00, Sat 8:30-12:00 o Provider comes to see babies at [place] Medicaid o Must have been referred from current patients or contacted office prior to delivery . [place] [facility] ([place] for [place]) Franne Forts, MD; Tamera Punt, MD; Doneen Poisson, MD; Fatima Sanger, MD; Wynetta Emery, MD; Jess Barters, MD; Tami Ribas, MD; Herbert Moors, MD; Derrell Lolling, MD; Dorothyann Peng, MD; Lucious Groves, NP; Baldo Ash, NP o [address], [place], [place] [address] o [phone] o Mon, Tue, Thur, Fri 8:30-5:30, Wed 9:30-5:30, Sat 8:30-12:30 o Babies seen by [facility] providers o Accepting Medicaid o Only accepting infants of first-time parents or siblings of current patients [facility] discharge coordinator will make follow-up appointment . Baltazar Najjar o [place] [address]  [place], [place]  [address] o [phone]   Fax - [phone] . [facility] o [address], [place], [place]  [address] o Phone - [phone]   Fax [phone] . [place], [place], [place], [place]  [address] o [phone]  East/Northeast [place] [phone]) . [facility] Reginal Lutes, MD; Jacklynn Ganong, MD; Torrie Mayers, MD; MD; Rosana Hoes, MD; Servando Salina, MD; Rose Fillers, MD; Rex Kras, MD; Corinna Capra, MD; Volney American, MD; Trilby Drummer, MD; Janann Colonel, MD; Jimmye [place], [place], [place], [place] [address] o [phone] o Mon-Fri 8:30-5:00 (extended evenings Mon-Thur as needed), Sat-Sun 10:00-1:00 o Providers come to see babies at [place] Medicaid for families of first-time babies and families with all children in the household age 98 and under. Must register with office prior to making appointment (M-F only). . [place], NP; Tomi Bamberger, MD; Redmond School, MD; [place], [place] [address]., [place], [place] [address] o [phone] o Mon-Fri 8:00-5:00 o Babies seen by providers at [facility] ([place]) o Does NOT accept Medicaid/Commercial Insurance Only . [facility] [place] ([facility])  Marnee Guarneri, MD; Drema [place], MD; Montine Circle, MD; Vilma Prader, MD; Vanita Panda, MD; Alfonso Ramus, MD; Ruthann Cancer, MD; Roxanne Mins, MD; Rosalva Ferron, MD; Polly Cobia, MD o [address] [place], [place] [address] o [phone] o Mon-Fri 8:30-5:30, Sat (Oct.-Mar.) 9:00-1:00 o Babies seen by providers at [place] [phone]) . [facility] Peers, MD; Suzan Slick, MD o [address], [place], [place] [address] o [phone] o Mon-Fri 8:30-5:00, Sat 8:30-12:00 o Providers come to see babies at [facility] o Does NOT accept Medicaid . [facility]  [facility] Ricci Barker, PA; Mannie Stabile, MD; [place], [place]; [name], MD; Moreen Fowler, [place],  [place], [place] [address] o [phone] o Mon-Fri 8:00-5:00 o Babies seen by providers at [facility] o Does NOT accept Medicaid o Only accepting babies of parents who are patients o Please call early in hospitalization for appointment (limited availability) . [facility] Pediatricians Blanca Friend, MD; Sharlene Motts, MD; Rod Can, MD; Warner Mccreedy, NP; Sabra Heck, MD; Ermalinda Memos, MD; Sharlett Iles, NP; Aurther Loft, MD; Jerrye Beavers, MD; Marcello Moores, MD; Berline Lopes, MD; Charolette Forward, MD o [place]. [place], [place], [place] [address] o [phone] o Mon-Fri 8:00-5:00, Sat 9:00-12:00 o Providers come to see babies at [facility] o Does NOT accept [facility] [phone]) . [place] [facility] [place] providers accepting new patients: Dayna Ramus, NP; [place], [place], [place], [place] [address] o [phone] o Mon-Fri 8:00-5:00 o Babies seen by providers at [facility] o Does NOT accept Medicaid o Only accepting babies of parents who are patients o Please call early in hospitalization for appointment (limited availability) . [facility] Oswaldo Conroy, MD; Sheran Lawless, MD o [address] [place], [place] [address] o [phone] (press 1 to schedule appointment) o Mon-Fri 8:00-5:00 o Providers come to see babies at [facility] o Does NOT accept Medicaid . [facility] Jodi Mourning, MD o [address] [place], [place] [address] o [phone] o Mon-Fri 8:30-5:00 (lunch 12:30-1:00), extended hours by appointment only Wed 5:00-6:30 o Babies seen by [facility] providers o Accepting Medicaid . [place] at Evalyn Casco, MD; [place], MD; Ethlyn Gallery, MD o [place], [place], [place] [address] o [phone] o Mon-Fri 8:00-5:00 o Babies seen by [facility] ([place]) providers o Does NOT accept Medicaid . Therapist, music at [place], MD; Yong Channel, MD; [place], [place] [address]., [place], [place]  [address] o [phone] o Mon-Fri 8:00-5:00 o Babies seen by [facility] providers o Does NOT accept Medicaid . [place], [place]; [place], [place]; [place], NP; Albertina Parr, MD; Frederic Jericho, MD; Ronney Lion, MD; Carlos Levering, NP; Jerelene Redden, NP; Tomasita Crumble, NP; Ronelle Nigh, NP; Corinna Lines, MD; Ko Olina, MD o [place]., [place], [place] [address] o [phone] o Mon-Fri 8:30-5:00, Sat 10:00-1:00 o Providers come to see babies at [facility] o Does NOT accept Medicaid o Free prenatal information session Tuesdays at 4:45pm . [facility] Porfirio Oar, MD; [place], [place]; [place], [place]; [place], [place]., [place] [address] o [phone] o Mon-Fri 7:30-5:30 o Babies seen by [facility] providers . [facility] o [address], [place], [place], [place]  [address] o [phone]   Fax - [phone]  [place] [phone] & [phone]) . [place] [address] [place], [place] [address] o [phone] o Mon-Thur 8:00-6:00 o Providers come to see babies at [place] Medicaid . [place], NP; Melford Aase, MD; [place], [place]; [place], [place]., [place], [place] [address] o [phone] o Mon-Thur 7:30-7:30, Fri 7:30-4:30 o Babies seen by [facility] providers o Accepting Medicaid . [facility] Nyra Jabs, MD; Cristino Martes, NP; Gertie Baron, MD o [place] [address], [place], [place] [address] o [phone] o Mon-Fri 8:30-5:00, Sat 8:30-12:00 o Providers come to see babies at [place] Medicaid o Must have "Meet & Greet" appointment at office prior to delivery . [place] ([place]) o [place],  MD; Juleen China, MD; Clydene Laming, [place] [address], [place], [place] [address] o [phone] o Mon-Wed 8:00-6:00, Thur-Fri 8:00-5:00, Sat 9:00-12:00 o Providers come to  see babies at [facility] o Does NOT accept Medicaid o Only accepting siblings of current patients . [facility] [place], [place], [place], [place]  [address] o [phone]   Fax [phone] . [place] at [place] [address], [place], [place]  [address] o [phone]   Fax - [place] Gorman [phone] & [phone]) . Therapist, music at [place], DO; [place], [place]., [place], [place] [address] o [phone] o Mon-Fri 7:00-5:00 o Babies seen by [facility] providers o Does NOT accept Medicaid . [place], MD; [place], [place]; [place], [place], [place], [place] [address] o [phone] o Mon-Fri 8:00-5:00 o Babies seen by [facility] providers o Accepting Medicaid . [place], MD; [place], [place]; [place], NP; [place], [place], [place], [place] [address] o [phone] o Mon-Fri 8:00-5:00 o Babies seen by providers at [place] [phone]) . [facility] [place], [place] o [address] [place], [place] [address] o [phone] o Mon-Fri 8:00-5:00 o Babies seen by [facility] providers o Does NOT accept Medicaid o Limited availability, please call early in hospitalization to schedule follow-up . [facility] Leilani Merl, PA; Maisie Fus, MD; [place], MD; [place], [place]; Jeannine Kitten, MD; [place], [place] [facility] [address], [place], [place] [address] o [phone] o Mon-Fri 8:30-5:00, Sat 9:00-12:00 o Babies seen by providers at [facility] o Accepting Medicaid o Please register online then schedule online or call office o [email or web address] . [place] ([place] at  [place]) Kristian Covey, NP; Dwyane Dee, MD; Leonidas Romberg, PA o [address] [place], [place], [place] [address] o [phone] o Mon-Fri 8:00-5:00 o Babies seen by providers at [place] o Accepting Medicaid . [place] ([facility] [employer]) Dairl Ponder, MD; Rayvon Char, NP; Melina Modena, MD o [address] [place], [place], [place] [address] o [phone] o Mon-Fri 8:00-5:30, Sat&Sun by appointment (phones open at 8:30) o Babies seen by [facility] providers o Accepting Medicaid o Must be a first-time baby or sibling of current patient . [place], [address], [place], [place]  [address] o [phone]   Fax - [phone]  [place] [phone] & [phone]) . [place], [place]; [place], [place]; Benjamine Mola, MD; [place], [place]; Harrell Lark, MD o [address] [place], [place] [address] o [phone] o Mon-Thur 8:00-7:00, Fri 8:00-5:00, Sat 8:00-12:00, Sun 9:00-12:00 o Babies seen by [facility] providers o Accepting Medicaid . [facility] at [facility], MD; Ruthann Cancer, MD; [facility], MD o [address], [place], [place] [address] o [phone] o Mon-Thur 8:00-5:00 o Babies seen by providers at [facility] o Accepting Medicaid . [facility] at [place], MD; Coe[place]Go[place], MD; Amedeo Plenty, MD; Bobby Rumpf, MD; List, MD; Lavonia Drafts, MD; Ruthann Cancer, MD; Selinda Eon, MD; Audie Box, MD; Jim Like, MD; Christie Nottingham, MD; Hubbard Hartshorn, MD; [place] Nunnery, MD o [place]., [place], [place]  [address] o [phone] o Mon-Fri 8:00-5:30, Sat (Oct.-Mar.) 9:00-1:00 o Babies seen by providers at [facility] o Accepting Medicaid o Must fill out new patient packet, available online at [email or web address] . [place] ([facility]) Barnabas Lister, NP; [place] Kingfisher, NP; Claiborne Billings, NP; Rolla Plate, MD;  [place], [place]; Carola Rhine, MD; Tyron Russell, MD; Delia Chimes, NP o [address], [place], [place] [address] o [phone] o Mon-Thur 8:00-5:30, Fri 8:00-5:00 o Babies seen by providers at [place] [phone]) . [place], [place]; [place], MD; Dennard Schaumann, MD; [place], [place] o [address] [place], [place] [address] o [phone] o Mon-Fri 8:00-5:00 o Babies seen by providers at [place] [phone]) . [place] at [place], [place]; Olen Pel, MD; [place], [place], [place], [place] [address] o [phone] o Mon-Fri 8:00-5:00 o Babies seen by providers at [facility] o Does NOT accept Medicaid o Limited appointment availability, please call early in hospitalization  . Therapist, music at [place], [place]; [place], [address] [address] [place], [place] [address] o [phone] o Mon-Fri 8:00-5:00 o Babies seen by [facility] providers o Does NOT accept Medicaid . [facility] Su Grand, MD; Guy Sandifer, MD; [place], [place]; [place], [place] [address], [place], [place] [address] o [phone] o Mon-Fri 8:00-5:00 o After hours clinic [facility] [place], [place] [address]) [phone] Mon-Fri 5:00-8:00, Sat 12:00-6:00, Sun 10:00-4:00 o Babies seen by [facility] providers o Accepting Medicaid . [place] at [facility] o [address], [place], [place]  [address] o [phone]   Fax - [phone]  [place] [phone]) . Therapist, music at [facility], MD o [address] [place], [place], [place] [address] o [phone] o Mon-Fri 8:00-5:00 o Babies seen by [facility] providers o Does NOT accept Medicaid . [employer] ([place] [facility] [place]) Bing Neighbors, MD o [address], [place], [place]  [address] o [phone] o Mon-Thur 8:00-7:00, Fri 8:00-5:00, Sat 8:00-12:00 o Babies seen by providers at [facility] o Accepting Medicaid - but does not have vaccinations in office (must be received elsewhere) o Limited availability, please call early in hospitalization  [place] ([address]) . [place], MD o [address], [place] [address] o [phone]  Fax [phone]  Childbirth Education Options: [facility] Department Classes:  Childbirth education classes can help you get ready for a positive parenting experience. You can also meet other expectant parents and get free stuff for your baby. Each class runs for five weeks on the same night and costs $45 for the mother-to-be and her support person. Medicaid covers the cost if you are eligible. Call [phone] to register. [facility] Childbirth Education:  [phone] or [phone] or [email or web address]  Baby & Me Class: Discuss newborn & infant parenting and family adjustment issues with other new mothers in a relaxed environment. Each week brings a new speaker or baby-centered activity. We encourage new mothers to join [place] every Thursday at 11:00am. Babies birth until crawling. No registration or fee. Daddy [employer]: This course offers Dads-to-be the tools and knowledge needed to feel confident on their journey to becoming new fathers. Experienced dads, who have been trained as coaches, teach dads-to-be how to  hold, comfort, diaper, swaddle and play with their infant while being able to support the new mom as well. A class for men taught by men. $25/dad Big Brother/Big Sister: Let your children share in the joy of a new brother or sister in this special class designed just for them. Class includes discussion about how families care for babies: swaddling, holding, diapering, safety as well as how they can be helpful in their new role. This class is designed for  children ages 20 to 95, but any age is welcome. Please register each child individually. $5/child  Mom Talk: This mom-led group offers support and connection to mothers as they journey through the adjustments and struggles of that sometimes overwhelming first year after the birth of a child. Tuesdays at 10:00am and Thursdays at 6:00pm. Babies welcome. No registration or fee. Breastfeeding Support Group: This group is a mother-to-mother support circle where moms have the opportunity to share their breastfeeding experiences. A Lactation Consultant is present for questions and concerns. Meets each Tuesday at 11:00am. No fee or registration. Breastfeeding Your Baby: Learn what to expect in the first days of breastfeeding your newborn.  This class will help you feel more confident with the skills needed to begin your breastfeeding experience. Many new mothers are concerned about breastfeeding after leaving the hospital. This class will also address the most common fears and challenges about breastfeeding during the first few weeks, months and beyond. (call for fee) Comfort Techniques and Tour: This 2 hour interactive class will provide you the opportunity to learn & practice hands-on techniques that can help relieve some of the discomfort of labor and encourage your baby to rotate toward the best position for birth. You and your partner will be able to try a variety of labor positions with birth balls and rebozos as well as practice breathing, relaxation, and visualization techniques. A tour of the [facility] is included with this class. $20 per registrant and support person Childbirth Class- Weekend Option: This class is a Weekend version of our Birth & Baby series. It is designed for parents who have a difficult time fitting several weeks of classes into their schedule. It covers the care of your newborn and the basics of labor and childbirth. It also includes a [place]  [facility] and lunch. The class is held two consecutive days: beginning on Friday evening from 6:30 - 8:30 p.m. and the next day, Saturday from 9 a.m. - 4 p.m. (call for fee) Doren Custard Class: Interested in a waterbirth?  This informational class will help you discover whether waterbirth is the right fit for you. Education about waterbirth itself, supplies you would need and how to assemble your support team is what you can expect from this class. Some obstetrical practices require this class in order to pursue a waterbirth. (Not all obstetrical practices offer waterbirth-check with your healthcare provider.) Register only the expectant mom, but you are encouraged to bring your partner to class! Required if planning waterbirth, no fee. Infant/Child CPR: Parents, grandparents, babysitters, and friends learn Cardio-Pulmonary Resuscitation skills for infants and children. You will also learn how to treat both conscious and unconscious choking in infants and children. This Family & Friends program does not offer certification. Register each participant individually to ensure that enough mannequins are available. (Call for fee) Grandparent Love: Expecting a grandbaby? This class is for you! Learn about the latest infant care and safety recommendations and ways to support your own child as  he or she transitions into the parenting role. Taught by Registered Nurses who are childbirth instructors, but most importantly...they are grandmothers too! $10/person. Childbirth Class- Natural Childbirth: This series of 5 weekly classes is for expectant parents who want to learn and practice natural methods of coping with the process of labor and childbirth. Relaxation, breathing, massage, visualization, role of the partner, and helpful positioning are highlighted. Participants learn how to be confident in their body's ability to give birth. This class will empower and help parents make informed decisions about their own  care. Includes discussion that will help new parents transition into the immediate postpartum period. [facility] is included. We suggest taking this class between 25-32 weeks, but it's only a recommendation. $75 per registrant and one support person or $30 Medicaid. Childbirth Class- 3 week Series: This option of 3 weekly classes helps you and your labor partner prepare for childbirth. Newborn care, labor & birth, cesarean birth, pain management, and comfort techniques are discussed and a [place] of [facility] is included. The class meets at the same time, on the same day of the week for 3 consecutive weeks beginning with the starting date you choose. $60 for registrant and one support person.  Marvelous Multiples: Expecting twins, triplets, or more? This class covers the differences in labor, birth, parenting, and breastfeeding issues that face multiples' parents. NICU tour is included. Led by a Certified Childbirth Educator who is the mother of twins. No fee. Caring for Baby: This class is for expectant and adoptive parents who want to learn and practice the most up-to-date newborn care for their babies. Focus is on birth through the first six weeks of life. Topics include feeding, bathing, diapering, crying, umbilical cord care, circumcision care and safe sleep. Parents learn to recognize symptoms of illness and when to call the pediatrician. Register only the mom-to-be and your partner or support person can plan to come with you! $10 per registrant and support person Childbirth Class- online option: This online class offers you the freedom to complete a Birth and Baby series in the comfort of your own home. The flexibility of this option allows you to review sections at your own pace, at times convenient to you and your support people. It includes additional video information, animations, quizzes, and extended activities. Get organized with helpful  eClass tools, checklists, and trackers. Once you register online for the class, you will receive an email within a few days to accept the invitation and begin the class when the time is right for you. The content will be available to you for 60 days. $60 for 60 days of online access for you and your support people.  Local Doulas: Natural Baby Doulas [email or web address] Tel: [phone] [email or web address] [employer] [phone] [email or web address] The Labor Hassell Halim  (also do waterbirth tub rental) [phone] [email or web address] Triad Birth Doula [phone] [email or web address] [email or web address].fi Sacred Rhythms  [phone] https://[email or web address]/ [employer] (PADA) [email or web address]/ [place] Birth and Baby  [email or web address] Considering Waterbirth? Guide for patients at Center for [employer]  Why consider waterbirth?  . Gentle birth for babies . Less pain medicine used in labor . May allow for passive descent/less pushing . May reduce perineal tears  . More mobility and instinctive maternal position changes . Increased maternal relaxation . Reduced blood pressure in labor  Is waterbirth safe? What are the risks of infection, drowning or other complications?  . Infection: o Very low risk (3.7 % for  tub vs 4.8% for bed) o 7 in 8000 waterbirths with documented infection o Poorly cleaned equipment most common cause o Slightly lower group B strep transmission rate  . Drowning o Maternal:  - Very low risk   - Related to seizures or fainting o Newborn:  - Very low risk. No evidence of increased risk of respiratory problems in multiple large studies - Physiological protection from breathing under water - Avoid underwater birth if there are any fetal  complications - Once baby's head is out of the water, keep it out.  . Birth complication o Some reports of cord trauma, but risk decreased by bringing baby to surface gradually o No evidence of increased risk of shoulder dystocia. Mothers can usually change positions faster in water than in a bed, possibly aiding the maneuvers to free the shoulder.   You must attend a Doren Custard class at [facility]  3rd Wednesday of every month from 7-9pm  [employer] by calling [phone] or online at [email or web address]  Bring [place] the certificate from the class to your prenatal appointment  Meet with a midwife at 36 weeks to see if you can still plan a waterbirth and to sign the consent.   Purchase or rent the following supplies:   Water Birth Pool (Birth Pool in a Box or [place] for instance)  (Tubs start ~$125)  Single-use disposable tub liner designed for your brand of tub  New garden hose labeled "lead-free", "suitable for drinking water",  Electric drain pump to remove water (We recommend 792 gallon per hour or greater pump.)   Separate garden hose to remove the dirty water  Fish net  Bathing suit top (optional)  Long-handled mirror (optional)  Places to purchase or rent supplies  [employer] for tub purchases and supplies  Waterbirthsolutions.com for tub purchases and supplies  [employer] (www.thelaborladies.com) $275 for tub rental/set-up & take down/kit   [employer] (http://www.fleming.com/.htm) Information regarding doulas (labor support) who provide pool rentals  Our practice has a Birth Pool in a Box tub at the hospital that you may borrow on a first-come-first-served basis. It is your responsibility to to set up, clean and break down the tub. We cannot guarantee the availability of this tub in advance. You are responsible for bringing all accessories listed above. If you do not have all necessary supplies you cannot  have a waterbirth.    Things that would prevent you from having a waterbirth:  Premature, <37wks  Previous cesarean birth  Presence of thick meconium-stained fluid  Multiple gestation (Twins, triplets, etc.)  Uncontrolled diabetes or gestational diabetes requiring medication  Hypertension requiring medication or diagnosis of pre-eclampsia  Heavy vaginal bleeding  Non-reassuring fetal heart rate  Active infection (MRSA, etc.). Group B Strep is NOT a contraindication for  waterbirth.  If your labor has to be induced and induction method requires continuous  monitoring of the baby's heart rate  Other risks/issues identified by your obstetrical provider  Please remember that birth is unpredictable. Under certain unforeseeable circumstances your provider may advise against giving birth in the tub. These decisions will be made on a case-by-case basis and with the safety of you and your baby as our highest priority.   Safe Medications in Pregnancy   Acne:  Benzoyl Peroxide  Salicylic Acid   Backache/Headache:  Tylenol: 2 regular strength every 4 hours OR        2 Extra strength every 6 hours   Colds/Coughs/Allergies:  Benadryl (alcohol free) 25  mg every 6 hours as needed  Breath right strips  Claritin  Cepacol throat lozenges  Chloraseptic throat spray  Cold-Eeze- up to three times per day  Cough drops, alcohol free  Flonase (by prescription only)  Guaifenesin  Mucinex  Robitussin DM (plain only, alcohol free)  Saline nasal spray/drops  Sudafed (pseudoephedrine) & Actifed * use only after [redacted] weeks gestation and if you do not have high blood pressure  Tylenol  Vicks Vaporub  Zinc lozenges  Zyrtec   Constipation:  Colace  Ducolax suppositories  Fleet enema  Glycerin suppositories  Metamucil  Milk of magnesia  Miralax  Senokot  Smooth move tea   Diarrhea:  Kaopectate  Imodium A-D   *NO pepto Bismol   Hemorrhoids:  Anusol  Anusol HC   Preparation H  Tucks   Indigestion:  Tums  Maalox  Mylanta  Zantac  Pepcid   Insomnia:  Benadryl (alcohol free) 77m every 6 hours as needed  Tylenol PM  Unisom, no Gelcaps   Leg Cramps:  Tums  MagGel   Nausea/Vomiting:  Bonine  Dramamine  Emetrol  Ginger extract  Sea bands  Meclizine  Nausea medication to take during pregnancy:  Unisom (doxylamine succinate 25 mg tablets) Take one tablet daily at bedtime. If symptoms are not adequately controlled, the dose can be increased to a maximum recommended dose of two tablets daily (1/2 tablet in the morning, 1/2 tablet mid-afternoon and one at bedtime).  Vitamin B6 1052mtablets. Take one tablet twice a day (up to 200 mg per day).   Skin Rashes:  Aveeno products  Benadryl cream or 2522mvery 6 hours as needed  Calamine Lotion  1% cortisone cream   Yeast infection:  Gyne-lotrimin 7  Monistat 7    **If taking multiple medications, please check labels to avoid duplicating the same active ingredients  **take medication as directed on the label  ** Do not exceed 4000 mg of tylenol in 24 hours  **Do not take medications that contain aspirin or ibuprofen          Start taking a baby aspirin daily.  Miralax daily as needed for constipation. Diclegis for nausea/vomiting at bedtime and increase as needed.  Start taking prenatal vitamins that you buy over-the-counter.  Smoking cessation as able.
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
## 2018-12-21 ENCOUNTER — Other Ambulatory Visit: Payer: Self-pay | Admitting: Family Medicine

## 2018-12-21 DIAGNOSIS — A5901 Trichomonal vulvovaginitis: Secondary | ICD-10-CM

## 2018-12-21 DIAGNOSIS — A568 Sexually transmitted chlamydial infection of other sites: Secondary | ICD-10-CM

## 2018-12-21 LAB — OBSTETRIC PANEL, INCLUDING HIV
Antibody Screen: NEGATIVE
Basophils Absolute: 0 10*3/uL (ref 0.0–0.2)
Basos: 0 %
EOS (ABSOLUTE): 0 10*3/uL (ref 0.0–0.4)
Eos: 0 %
HIV Screen 4th Generation wRfx: NONREACTIVE
Hematocrit: 37 % (ref 34.0–46.6)
Hemoglobin: 12.7 g/dL (ref 11.1–15.9)
Hepatitis B Surface Ag: NEGATIVE
Immature Grans (Abs): 0 10*3/uL (ref 0.0–0.1)
Immature Granulocytes: 0 %
Lymphocytes Absolute: 1.6 10*3/uL (ref 0.7–3.1)
Lymphs: 13 %
MCH: 31.4 pg (ref 26.6–33.0)
MCHC: 34.3 g/dL (ref 31.5–35.7)
MCV: 92 fL (ref 79–97)
Monocytes Absolute: 0.7 10*3/uL (ref 0.1–0.9)
Monocytes: 5 %
Neutrophils Absolute: 10.6 10*3/uL — ABNORMAL HIGH (ref 1.4–7.0)
Neutrophils: 82 %
Platelets: 295 10*3/uL (ref 150–450)
RBC: 4.04 x10E6/uL (ref 3.77–5.28)
RDW: 11.8 % (ref 11.7–15.4)
RPR Ser Ql: NONREACTIVE
Rh Factor: POSITIVE
Rubella Antibodies, IGG: 2.07 index (ref >0.99)
WBC: 13 10*3/uL — ABNORMAL HIGH (ref 3.4–10.8)

## 2018-12-21 LAB — TSH: TSH: 0.005 u[IU]/mL — ABNORMAL LOW (ref 0.450–4.500)

## 2018-12-21 LAB — CYTOLOGY - PAP: Diagnosis: NEGATIVE

## 2018-12-21 LAB — COMPREHENSIVE METABOLIC PANEL
ALT: 12 IU/L (ref 0–32)
AST: 18 IU/L (ref 0–40)
Albumin/Globulin Ratio: 2.1 (ref 1.2–2.2)
Albumin: 4 g/dL (ref 3.9–5.0)
Alkaline Phosphatase: 63 IU/L (ref 39–117)
BUN/Creatinine Ratio: 10 (ref 9–23)
BUN: 5 mg/dL — ABNORMAL LOW (ref 6–20)
Bilirubin Total: 0.4 mg/dL (ref 0.0–1.2)
CO2: 22 mmol/L (ref 20–29)
Calcium: 9 mg/dL (ref 8.7–10.2)
Chloride: 103 mmol/L (ref 96–106)
Creatinine, Ser: 0.48 mg/dL — ABNORMAL LOW (ref 0.57–1.00)
GFR calc Af Amer: 155 mL/min/{1.73_m2} (ref >59)
GFR calc non Af Amer: 135 mL/min/{1.73_m2} (ref >59)
Globulin, Total: 1.9 g/dL (ref 1.5–4.5)
Glucose: 82 mg/dL (ref 65–99)
Potassium: 4.3 mmol/L (ref 3.5–5.2)
Sodium: 139 mmol/L (ref 134–144)
Total Protein: 5.9 g/dL — ABNORMAL LOW (ref 6.0–8.5)

## 2018-12-21 LAB — PROTEIN / CREATININE RATIO, URINE
Creatinine, Urine: 180.4 mg/dL
Protein, Ur: 19.5 mg/dL
Protein/Creat Ratio: 108 mg/g creat (ref 0–200)

## 2018-12-21 LAB — CERVICOVAGINAL ANCILLARY ONLY
Chlamydia: POSITIVE — AB
Neisseria Gonorrhea: NEGATIVE
Trichomonas: POSITIVE — AB

## 2018-12-21 LAB — HEMOGLOBIN A1C
Est. average glucose Bld gHb Est-mCnc: 100 mg/dL
Hgb A1c MFr Bld: 5.1 % (ref 4.8–5.6)

## 2018-12-21 MED ORDER — METRONIDAZOLE 500 MG PO TABS: 500.0000 mg | ORAL_TABLET | Freq: Two times a day (BID) | ORAL | 0 refills | Status: DC

## 2018-12-21 MED ORDER — AZITHROMYCIN 500 MG PO TABS: 1000.0000 mg | ORAL_TABLET | Freq: Once | ORAL | 0 refills | Status: AC

## 2018-12-22 ENCOUNTER — Telehealth: Payer: Self-pay

## 2018-12-22 NOTE — Telephone Encounter (Signed)
Called pt to make sure she saw her My Chart message regarding her test results & she had not, avdised pt she tested positive for Chlamydia & Tric & that medication was sent to her Pharmacy on file.Also advised to have partner treated & no sex until 2 weeks after last person treated. Pt had no questions.

## 2018-12-22 NOTE — Progress Notes (Unsigned)
STD Card completed  Pam Nakiea Metzner,CMA [facility] 12/22/2018

## 2018-12-24 ENCOUNTER — Encounter: Payer: Self-pay | Admitting: General Practice

## 2018-12-25 ENCOUNTER — Other Ambulatory Visit: Payer: Self-pay | Admitting: Family Medicine

## 2018-12-25 DIAGNOSIS — A491 Streptococcal infection, unspecified site: Secondary | ICD-10-CM

## 2018-12-25 LAB — URINE CULTURE, OB REFLEX

## 2018-12-25 LAB — CULTURE, OB URINE

## 2018-12-25 MED ORDER — AMOXICILLIN 875 MG PO TABS: 875.0000 mg | ORAL_TABLET | Freq: Two times a day (BID) | ORAL | 0 refills | Status: AC

## 2018-12-28 LAB — T4, FREE: Free T4: 1.32 ng/dL (ref 0.82–1.77)

## 2018-12-28 LAB — SPECIMEN STATUS REPORT

## 2018-12-30 ENCOUNTER — Encounter: Payer: Self-pay | Admitting: *Deleted

## 2018-12-31 ENCOUNTER — Encounter: Payer: Self-pay | Admitting: *Deleted

## 2018-12-31 ENCOUNTER — Telehealth: Payer: Self-pay | Admitting: *Deleted

## 2018-12-31 NOTE — Telephone Encounter (Signed)
Received Horizon results showing increased risk SMA. Notified patient and informed her she can make a Retail buyer appointment with [name] . She could not write down number and I informed her I would send the phone number to her MyChart. She voices understanding.  Harland Aguiniga,RN

## 2019-01-02 ENCOUNTER — Other Ambulatory Visit: Payer: Self-pay | Admitting: Obstetrics and Gynecology

## 2019-01-02 DIAGNOSIS — O0990 Supervision of high risk pregnancy, unspecified, unspecified trimester: Secondary | ICD-10-CM

## 2019-01-03 ENCOUNTER — Telehealth: Payer: Self-pay | Admitting: *Deleted

## 2019-01-03 NOTE — Telephone Encounter (Signed)
Tommy left a voicemail this am asking for phone number to call specialist back- states she lost the number.  I called Gyanna and she states she had called again and someone gave her the phone number. Linda,RN

## 2019-01-17 ENCOUNTER — Other Ambulatory Visit: Payer: Self-pay

## 2019-01-17 ENCOUNTER — Encounter: Payer: Self-pay | Admitting: Obstetrics and Gynecology

## 2019-01-17 ENCOUNTER — Ambulatory Visit (INDEPENDENT_AMBULATORY_CARE_PROVIDER_SITE_OTHER): Payer: Self-pay | Admitting: Obstetrics and Gynecology

## 2019-01-17 VITALS — BP 106/73 | HR 90 | Wt 124.5 lb

## 2019-01-17 DIAGNOSIS — O98812 Other maternal infectious and parasitic diseases complicating pregnancy, second trimester: Secondary | ICD-10-CM

## 2019-01-17 DIAGNOSIS — O0992 Supervision of high risk pregnancy, unspecified, second trimester: Secondary | ICD-10-CM

## 2019-01-17 DIAGNOSIS — Z3A16 16 weeks gestation of pregnancy: Secondary | ICD-10-CM

## 2019-01-17 DIAGNOSIS — A749 Chlamydial infection, unspecified: Secondary | ICD-10-CM

## 2019-01-17 DIAGNOSIS — O23592 Infection of other part of genital tract in pregnancy, second trimester: Secondary | ICD-10-CM

## 2019-01-17 DIAGNOSIS — Z8759 Personal history of other complications of pregnancy, childbirth and the puerperium: Secondary | ICD-10-CM

## 2019-01-17 DIAGNOSIS — A5901 Trichomonal vulvovaginitis: Secondary | ICD-10-CM

## 2019-01-17 DIAGNOSIS — O09292 Supervision of pregnancy with other poor reproductive or obstetric history, second trimester: Secondary | ICD-10-CM

## 2019-01-17 DIAGNOSIS — O0990 Supervision of high risk pregnancy, unspecified, unspecified trimester: Secondary | ICD-10-CM

## 2019-01-17 MED ORDER — METRONIDAZOLE 500 MG PO TABS: 500.0000 mg | ORAL_TABLET | Freq: Two times a day (BID) | ORAL | 0 refills | Status: DC

## 2019-01-17 MED ORDER — AZITHROMYCIN 500 MG PO TABS: 1000.0000 mg | ORAL_TABLET | Freq: Once | ORAL | 1 refills | Status: AC

## 2019-01-17 NOTE — Progress Notes (Signed)
Pt states during intercourse it hurts real bad during and afterwards in Pelvic area.Pt also has ovarian cyst. Pt has been having a tooth ache for the past 2 days.

## 2019-01-17 NOTE — Progress Notes (Signed)
Subjective:  Paula Gilbert is a 28 y.o. [phone] at [redacted]w[redacted]d being seen today for ongoing prenatal care.  She is currently monitored for the following issues for this high-risk pregnancy and has Trichomonal vaginitis during pregnancy in first trimester; Ovarian cyst affecting pregnancy in first trimester, antepartum; Nausea/vomiting in pregnancy; Chlamydia infection affecting pregnancy; Supervision of high risk pregnancy, antepartum; History of pre-eclampsia; Tobacco smoking affecting pregnancy in first trimester; and Subchorionic hemorrhage in first trimester on their problem list.  Patient reports has not taken medication for BV or CH d/t to Medicaid problems. Has ppt with them to discuss..  Contractions: Not present. Vag. Bleeding: None.  Movement: Present. Denies leaking of fluid.   The following portions of the patient's history were reviewed and updated as appropriate: allergies, current medications, past family history, past medical history, past social history, past surgical history and problem list. Problem list updated.  Objective:   Vitals:   01/17/19 1320  BP: 106/73  Pulse: 90  Weight: 124 lb 8 oz (56.5 kg)    Fetal Status: Fetal Heart Rate (bpm): 158   Movement: Present     General:  Alert, oriented and cooperative. Patient is in no acute distress.  Skin: Skin is warm and dry. No rash noted.   Cardiovascular: Normal heart rate noted  Respiratory: Normal respiratory effort, no problems with respiration noted  Abdomen: Soft, gravid, appropriate for gestational age. Pain/Pressure: Present     Pelvic:  Cervical exam deferred        Extremities: Normal range of motion.  Edema: None  Mental Status: Normal mood and affect. Normal behavior. Normal judgment and thought content.   Urinalysis:      Assessment and Plan:  Pregnancy: [id number] at [redacted]w[redacted]d  1. Supervision of high risk pregnancy, antepartum Advised to use OTC gummie PNV Anatomy scan scheduled  2. Chlamydia infection  affecting pregnancy in second trimester Importance of taking reviewed with pt Also advised to have partner seen and treated - azithromycin (ZITHROMAX) 500 MG tablet; Take 2 tablets (1,000 mg total) by mouth once for 1 dose.  Dispense: 2 tablet; Refill: 1  3. Trichomonal vaginitis during pregnancy in first trimester As noted above - metroNIDAZOLE (FLAGYL) 500 MG tablet; Take 1 tablet (500 mg total) by mouth 2 (two) times daily.  Dispense: 14 tablet; Refill: 0  4. History of pre-eclampsia Stable No S/Sx presently Continue with qd BASA  Preterm labor symptoms and general obstetric precautions including but not limited to vaginal bleeding, contractions, leaking of fluid and fetal movement were reviewed in detail with the patient. Please refer to After Visit Summary for other counseling recommendations.  Return in about 4 weeks (around 02/14/2019) for OB visit, virtual.   [name], MD

## 2019-01-17 NOTE — Patient Instructions (Signed)

## 2019-02-03 ENCOUNTER — Other Ambulatory Visit: Payer: Self-pay

## 2019-02-03 ENCOUNTER — Telehealth (INDEPENDENT_AMBULATORY_CARE_PROVIDER_SITE_OTHER): Payer: Self-pay | Admitting: Obstetrics and Gynecology

## 2019-02-03 ENCOUNTER — Emergency Department (HOSPITAL_COMMUNITY)
Admission: EM | Admit: 2019-02-03 | Discharge: 2019-02-03 | Disposition: A | Discharge status: Home / Self Care | Payer: Medicaid Other | Attending: Emergency Medicine | Admitting: Emergency Medicine

## 2019-02-03 ENCOUNTER — Encounter (HOSPITAL_COMMUNITY): Payer: Self-pay | Admitting: Emergency Medicine

## 2019-02-03 DIAGNOSIS — Z5321 Procedure and treatment not carried out due to patient leaving prior to being seen by health care provider: Secondary | ICD-10-CM | POA: Insufficient documentation

## 2019-02-03 DIAGNOSIS — O23591 Infection of other part of genital tract in pregnancy, first trimester: Secondary | ICD-10-CM

## 2019-02-03 DIAGNOSIS — R300 Dysuria: Secondary | ICD-10-CM | POA: Insufficient documentation

## 2019-02-03 DIAGNOSIS — A5901 Trichomonal vulvovaginitis: Secondary | ICD-10-CM

## 2019-02-03 LAB — URINALYSIS, ROUTINE W REFLEX MICROSCOPIC
Bilirubin Urine: NEGATIVE
Glucose, UA: NEGATIVE mg/dL
Ketones, ur: NEGATIVE mg/dL
Nitrite: NEGATIVE
Protein, ur: NEGATIVE mg/dL
Specific Gravity, Urine: 1.02 (ref 1.005–1.030)
pH: 7.5 (ref 5.0–8.0)

## 2019-02-03 LAB — URINALYSIS, MICROSCOPIC (REFLEX)

## 2019-02-03 NOTE — Telephone Encounter (Signed)
Patient also called and left message on nurse voicemail line requesting a refill.  Called patient, no answer- left message to call us back regarding your prescription.

## 2019-02-03 NOTE — Telephone Encounter (Signed)
Called patient back and discussed her pharmacy has the prescription, she just needs to call them and have them get the medication ready for her. Patient verbalized understanding & had no questions.

## 2019-02-03 NOTE — ED Notes (Signed)
No answer from lobby  

## 2019-02-03 NOTE — Telephone Encounter (Signed)
The patient stated she never got her refill prescription for the STD trich. She stated she visits the [facility] on [address].

## 2019-02-03 NOTE — ED Triage Notes (Signed)
Pt having dysuria. Reports her partner was tested last week for Trich and chlamydia, thinks she needs to be re-treated for it.

## 2019-02-07 ENCOUNTER — Other Ambulatory Visit: Payer: Self-pay

## 2019-02-07 ENCOUNTER — Ambulatory Visit (HOSPITAL_COMMUNITY): Payer: Medicaid Other | Admitting: *Deleted

## 2019-02-07 ENCOUNTER — Ambulatory Visit (HOSPITAL_COMMUNITY)
Admission: RE | Admit: 2019-02-07 | Discharge: 2019-02-07 | Disposition: A | Discharge status: Home / Self Care | Payer: Self-pay | Source: Ambulatory Visit | Attending: Obstetrics and Gynecology | Admitting: Obstetrics and Gynecology

## 2019-02-07 ENCOUNTER — Other Ambulatory Visit (HOSPITAL_COMMUNITY): Payer: Self-pay | Admitting: *Deleted

## 2019-02-07 ENCOUNTER — Encounter (HOSPITAL_COMMUNITY): Payer: Self-pay

## 2019-02-07 DIAGNOSIS — O219 Vomiting of pregnancy, unspecified: Secondary | ICD-10-CM

## 2019-02-07 DIAGNOSIS — O09292 Supervision of pregnancy with other poor reproductive or obstetric history, second trimester: Secondary | ICD-10-CM

## 2019-02-07 DIAGNOSIS — O0990 Supervision of high risk pregnancy, unspecified, unspecified trimester: Secondary | ICD-10-CM

## 2019-02-07 DIAGNOSIS — Z8759 Personal history of other complications of pregnancy, childbirth and the puerperium: Secondary | ICD-10-CM

## 2019-02-07 DIAGNOSIS — O4592 Premature separation of placenta, unspecified, second trimester: Secondary | ICD-10-CM

## 2019-02-07 DIAGNOSIS — O99332 Smoking (tobacco) complicating pregnancy, second trimester: Secondary | ICD-10-CM

## 2019-02-07 DIAGNOSIS — Z3A19 19 weeks gestation of pregnancy: Secondary | ICD-10-CM

## 2019-02-15 ENCOUNTER — Encounter: Payer: Medicaid Other | Admitting: Obstetrics & Gynecology

## 2019-02-21 ENCOUNTER — Ambulatory Visit (HOSPITAL_COMMUNITY): Payer: Medicaid Other | Attending: Obstetrics and Gynecology

## 2019-03-23 ENCOUNTER — Inpatient Hospital Stay (HOSPITAL_COMMUNITY)
Admission: AD | Admit: 2019-03-23 | Discharge: 2019-03-23 | Disposition: A | Discharge status: Home / Self Care | Payer: Medicaid Other | Attending: Obstetrics & Gynecology | Admitting: Obstetrics & Gynecology

## 2019-03-23 ENCOUNTER — Encounter (HOSPITAL_COMMUNITY): Payer: Self-pay

## 2019-03-23 ENCOUNTER — Other Ambulatory Visit: Payer: Self-pay

## 2019-03-23 DIAGNOSIS — R1010 Upper abdominal pain, unspecified: Secondary | ICD-10-CM | POA: Insufficient documentation

## 2019-03-23 DIAGNOSIS — Z3A26 26 weeks gestation of pregnancy: Secondary | ICD-10-CM

## 2019-03-23 DIAGNOSIS — Z3A25 25 weeks gestation of pregnancy: Secondary | ICD-10-CM | POA: Insufficient documentation

## 2019-03-23 DIAGNOSIS — Z7982 Long term (current) use of aspirin: Secondary | ICD-10-CM | POA: Insufficient documentation

## 2019-03-23 DIAGNOSIS — O26892 Other specified pregnancy related conditions, second trimester: Secondary | ICD-10-CM | POA: Insufficient documentation

## 2019-03-23 DIAGNOSIS — O99612 Diseases of the digestive system complicating pregnancy, second trimester: Secondary | ICD-10-CM | POA: Insufficient documentation

## 2019-03-23 DIAGNOSIS — K219 Gastro-esophageal reflux disease without esophagitis: Secondary | ICD-10-CM | POA: Insufficient documentation

## 2019-03-23 LAB — URINALYSIS, ROUTINE W REFLEX MICROSCOPIC
Bilirubin Urine: NEGATIVE
Glucose, UA: NEGATIVE mg/dL
Hgb urine dipstick: NEGATIVE
Ketones, ur: NEGATIVE mg/dL
Nitrite: NEGATIVE
Protein, ur: NEGATIVE mg/dL
Specific Gravity, Urine: 1.018 (ref 1.005–1.030)
pH: 7 (ref 5.0–8.0)

## 2019-03-23 LAB — WET PREP, GENITAL
Clue Cells Wet Prep HPF POC: NONE SEEN
Sperm: NONE SEEN
Trich, Wet Prep: NONE SEEN
Yeast Wet Prep HPF POC: NONE SEEN

## 2019-03-23 MED ORDER — FAMOTIDINE 40 MG PO TABS: 40.0000 mg | ORAL_TABLET | Freq: Every day | ORAL | 11 refills | Status: DC

## 2019-03-23 MED ORDER — ALUM & MAG HYDROXIDE-SIMETH 200-200-20 MG/5ML PO SUSP
30.0000 mL | Freq: Once | ORAL | Status: AC
  Administered 2019-03-23: 30 mL via ORAL
  Filled 2019-03-23: qty 30

## 2019-03-23 MED ORDER — DICYCLOMINE HCL 10 MG/5ML PO SOLN
10.0000 mg | Freq: Once | ORAL | Status: AC
  Administered 2019-03-23: 10 mg via ORAL
  Filled 2019-03-23: qty 5

## 2019-03-23 NOTE — MAU Note (Signed)
Pt presents to MAU with c/o upper abdominal pain that started today around 5 am and has become more constant. Pt denies VB and LOF. +FM
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
## 2019-03-23 NOTE — Discharge Instructions (Signed)

## 2019-03-24 LAB — GC/CHLAMYDIA PROBE AMP (~~LOC~~) NOT AT ARMC
Chlamydia: NEGATIVE
Comment: NEGATIVE
Comment: NORMAL
Neisseria Gonorrhea: NEGATIVE

## 2019-04-04 ENCOUNTER — Ambulatory Visit (HOSPITAL_COMMUNITY): Payer: Medicaid Other | Admitting: *Deleted

## 2019-04-04 ENCOUNTER — Other Ambulatory Visit: Payer: Self-pay

## 2019-04-04 ENCOUNTER — Encounter: Payer: Self-pay | Admitting: Family Medicine

## 2019-04-04 ENCOUNTER — Other Ambulatory Visit (HOSPITAL_COMMUNITY): Payer: Self-pay | Admitting: *Deleted

## 2019-04-04 ENCOUNTER — Encounter (HOSPITAL_COMMUNITY): Payer: Self-pay

## 2019-04-04 ENCOUNTER — Ambulatory Visit (HOSPITAL_COMMUNITY)
Admission: RE | Admit: 2019-04-04 | Discharge: 2019-04-04 | Disposition: A | Discharge status: Home / Self Care | Payer: Self-pay | Source: Ambulatory Visit | Attending: Obstetrics and Gynecology | Admitting: Obstetrics and Gynecology

## 2019-04-04 DIAGNOSIS — O09292 Supervision of pregnancy with other poor reproductive or obstetric history, second trimester: Secondary | ICD-10-CM

## 2019-04-04 DIAGNOSIS — Z362 Encounter for other antenatal screening follow-up: Secondary | ICD-10-CM

## 2019-04-04 DIAGNOSIS — Z3A27 27 weeks gestation of pregnancy: Secondary | ICD-10-CM

## 2019-04-04 DIAGNOSIS — O4592 Premature separation of placenta, unspecified, second trimester: Secondary | ICD-10-CM

## 2019-04-04 DIAGNOSIS — O219 Vomiting of pregnancy, unspecified: Secondary | ICD-10-CM

## 2019-04-04 DIAGNOSIS — O0990 Supervision of high risk pregnancy, unspecified, unspecified trimester: Secondary | ICD-10-CM

## 2019-04-04 DIAGNOSIS — Z8759 Personal history of other complications of pregnancy, childbirth and the puerperium: Secondary | ICD-10-CM | POA: Insufficient documentation

## 2019-04-04 DIAGNOSIS — O99332 Smoking (tobacco) complicating pregnancy, second trimester: Secondary | ICD-10-CM

## 2019-04-04 DIAGNOSIS — O3482 Maternal care for other abnormalities of pelvic organs, second trimester: Secondary | ICD-10-CM

## 2019-04-14 ENCOUNTER — Other Ambulatory Visit: Payer: Self-pay | Admitting: *Deleted

## 2019-04-14 DIAGNOSIS — O0990 Supervision of high risk pregnancy, unspecified, unspecified trimester: Secondary | ICD-10-CM

## 2019-04-15 ENCOUNTER — Other Ambulatory Visit: Payer: Medicaid Other

## 2019-04-15 ENCOUNTER — Ambulatory Visit (INDEPENDENT_AMBULATORY_CARE_PROVIDER_SITE_OTHER): Payer: Medicaid Other | Admitting: Obstetrics and Gynecology

## 2019-04-15 ENCOUNTER — Other Ambulatory Visit: Payer: Self-pay

## 2019-04-15 VITALS — BP 110/72 | HR 91 | Wt 148.0 lb

## 2019-04-15 DIAGNOSIS — Z8759 Personal history of other complications of pregnancy, childbirth and the puerperium: Secondary | ICD-10-CM

## 2019-04-15 DIAGNOSIS — A5901 Trichomonal vulvovaginitis: Secondary | ICD-10-CM | POA: Diagnosis not present

## 2019-04-15 DIAGNOSIS — O23591 Infection of other part of genital tract in pregnancy, first trimester: Secondary | ICD-10-CM

## 2019-04-15 DIAGNOSIS — A749 Chlamydial infection, unspecified: Secondary | ICD-10-CM

## 2019-04-15 DIAGNOSIS — N83202 Unspecified ovarian cyst, left side: Secondary | ICD-10-CM

## 2019-04-15 DIAGNOSIS — Z23 Encounter for immunization: Secondary | ICD-10-CM

## 2019-04-15 DIAGNOSIS — Z3A29 29 weeks gestation of pregnancy: Secondary | ICD-10-CM | POA: Diagnosis not present

## 2019-04-15 DIAGNOSIS — O0993 Supervision of high risk pregnancy, unspecified, third trimester: Secondary | ICD-10-CM

## 2019-04-15 DIAGNOSIS — O0990 Supervision of high risk pregnancy, unspecified, unspecified trimester: Secondary | ICD-10-CM

## 2019-04-15 DIAGNOSIS — O98813 Other maternal infectious and parasitic diseases complicating pregnancy, third trimester: Secondary | ICD-10-CM

## 2019-04-15 DIAGNOSIS — R8271 Bacteriuria: Secondary | ICD-10-CM

## 2019-04-15 DIAGNOSIS — O23593 Infection of other part of genital tract in pregnancy, third trimester: Secondary | ICD-10-CM | POA: Diagnosis not present

## 2019-04-15 NOTE — Progress Notes (Signed)
Prenatal Visit Note Date: 04/15/2019 Clinic: [facility]  Subjective:  Paula Gilbert is a 28 y.o. [phone] at [redacted]w[redacted]d being seen today for ongoing prenatal care.  She is currently monitored for the following issues for this high-risk pregnancy and has GBS bacteriuria; Trichomonal vaginitis during pregnancy in first trimester; Left ovarian cyst; Nausea/vomiting in pregnancy; Chlamydia infection affecting pregnancy; Supervision of high risk pregnancy, antepartum; History of pre-eclampsia; Tobacco smoking affecting pregnancy in first trimester; and Subchorionic hemorrhage in first trimester on their problem list.  Patient reports no complaints.   Contractions: Not present. Vag. Bleeding: None.  Movement: Present. Denies leaking of fluid.   The following portions of the patient's history were reviewed and updated as appropriate: allergies, current medications, past family history, past medical history, past social history, past surgical history and problem list. Problem list updated.  Objective:   Vitals:   04/15/19 0904  BP: 110/72  Pulse: 91  Weight: 148 lb (67.1 kg)    Fetal Status: Fetal Heart Rate (bpm): 155   Movement: Present     General:  Alert, oriented and cooperative. Patient is in no acute distress.  Skin: Skin is warm and dry. No rash noted.   Cardiovascular: Normal heart rate noted  Respiratory: Normal respiratory effort, no problems with respiration noted  Abdomen: Soft, gravid, appropriate for gestational age. Pain/Pressure: Present     Pelvic:  Cervical exam deferred        Extremities: Normal range of motion.  Edema: None  Mental Status: Normal mood and affect. Normal behavior. Normal judgment and thought content.   Urinalysis:      Assessment and Plan:  Pregnancy: [phone] at [redacted]w[redacted]d  1. Chlamydia infection affecting pregnancy in third trimester toc neg  2. Trichomonal vaginitis during pregnancy in first trimester toc neg  3. Left ovarian  cyst 12cm with septations on 11/30 u/s with normal fetal growth. Plan for serial u/s. ED precautions given. Try and remove if for c-section  4. Supervision of high risk pregnancy, antepartum Routine care. Ate today. Will come back next week for 28wk labs. Likely depo  5. History of pre-eclampsia Confirms low dose asa  6. GBS bacteriuria Needs toc. nv  Preterm labor symptoms and general obstetric precautions including but not limited to vaginal bleeding, contractions, leaking of fluid and fetal movement were reviewed in detail with the patient. Please refer to After Visit Summary for other counseling recommendations.  Return in about 1 week (around 04/22/2019).   Aletha Halim, MD

## 2019-04-19 ENCOUNTER — Ambulatory Visit: Payer: Medicaid Other | Admitting: *Deleted

## 2019-04-19 ENCOUNTER — Other Ambulatory Visit: Payer: Self-pay

## 2019-04-19 ENCOUNTER — Other Ambulatory Visit: Payer: Medicaid Other

## 2019-04-19 DIAGNOSIS — O0990 Supervision of high risk pregnancy, unspecified, unspecified trimester: Secondary | ICD-10-CM

## 2019-04-19 DIAGNOSIS — R8271 Bacteriuria: Secondary | ICD-10-CM

## 2019-04-19 LAB — POCT URINALYSIS DIP (DEVICE)
Bilirubin Urine: NEGATIVE
Glucose, UA: NEGATIVE mg/dL
Ketones, ur: NEGATIVE mg/dL
Nitrite: NEGATIVE
Protein, ur: NEGATIVE mg/dL
Specific Gravity, Urine: 1.025 (ref 1.005–1.030)
Urobilinogen, UA: 0.2 mg/dL (ref 0.0–1.0)
pH: 6.5 (ref 5.0–8.0)

## 2019-04-19 LAB — OB RESULTS CONSOLE GBS: GBS: POSITIVE

## 2019-04-19 NOTE — Progress Notes (Signed)
Pt had lab only visit today.

## 2019-04-20 LAB — CBC
Hematocrit: 33.3 % — ABNORMAL LOW (ref 34.0–46.6)
Hemoglobin: 11.6 g/dL (ref 11.1–15.9)
MCH: 31.5 pg (ref 26.6–33.0)
MCHC: 34.8 g/dL (ref 31.5–35.7)
MCV: 91 fL (ref 79–97)
Platelets: 283 10*3/uL (ref 150–450)
RBC: 3.68 x10E6/uL — ABNORMAL LOW (ref 3.77–5.28)
RDW: 13.1 % (ref 11.7–15.4)
WBC: 12.9 10*3/uL — ABNORMAL HIGH (ref 3.4–10.8)

## 2019-04-20 LAB — GLUCOSE TOLERANCE, 2 HOURS W/ 1HR
Glucose, 1 hour: 143 mg/dL (ref 65–179)
Glucose, 2 hour: 118 mg/dL (ref 65–152)
Glucose, Fasting: 66 mg/dL (ref 65–91)

## 2019-04-20 LAB — HIV ANTIBODY (ROUTINE TESTING W REFLEX): HIV Screen 4th Generation wRfx: NONREACTIVE

## 2019-04-20 LAB — RPR: RPR Ser Ql: NONREACTIVE

## 2019-04-22 LAB — URINE CULTURE, OB REFLEX

## 2019-04-22 LAB — CULTURE, OB URINE

## 2019-04-26 ENCOUNTER — Other Ambulatory Visit: Payer: Self-pay | Admitting: Obstetrics and Gynecology

## 2019-04-26 ENCOUNTER — Telehealth (INDEPENDENT_AMBULATORY_CARE_PROVIDER_SITE_OTHER): Payer: Medicaid Other | Admitting: Lactation Services

## 2019-04-26 DIAGNOSIS — O2342 Unspecified infection of urinary tract in pregnancy, second trimester: Secondary | ICD-10-CM | POA: Insufficient documentation

## 2019-04-26 DIAGNOSIS — O2343 Unspecified infection of urinary tract in pregnancy, third trimester: Secondary | ICD-10-CM

## 2019-04-26 DIAGNOSIS — O2340 Unspecified infection of urinary tract in pregnancy, unspecified trimester: Secondary | ICD-10-CM

## 2019-04-26 HISTORY — DX: Unspecified infection of urinary tract in pregnancy, unspecified trimester: O23.40

## 2019-04-26 MED ORDER — NITROFURANTOIN MONOHYD MACRO 100 MG PO CAPS: ORAL_CAPSULE | ORAL | 0 refills | Status: DC

## 2019-04-26 NOTE — Telephone Encounter (Signed)
Pt called and was informed she has a UTI and that ATB have been called into the office. Pt asked about other labs and was informed all other labs WNL. Pt aware to pick up ATB and begin taking today and make sure to finish dosage. Pt to call with questions or concerns as needed.

## 2019-04-26 NOTE — Telephone Encounter (Signed)
-----   Message from Aletha Halim, MD sent at 04/26/2019  7:43 AM EST ----- Please call her and let her know that I sent in an abx for her UTI. thanks

## 2019-05-02 ENCOUNTER — Other Ambulatory Visit: Payer: Self-pay

## 2019-05-02 ENCOUNTER — Ambulatory Visit (HOSPITAL_COMMUNITY)
Admission: RE | Admit: 2019-05-02 | Discharge: 2019-05-02 | Disposition: A | Discharge status: Home / Self Care | Payer: Medicaid Other | Source: Ambulatory Visit | Attending: Obstetrics and Gynecology | Admitting: Obstetrics and Gynecology

## 2019-05-02 ENCOUNTER — Encounter (HOSPITAL_COMMUNITY): Payer: Self-pay

## 2019-05-02 ENCOUNTER — Ambulatory Visit (HOSPITAL_COMMUNITY): Payer: Medicaid Other | Admitting: *Deleted

## 2019-05-02 ENCOUNTER — Other Ambulatory Visit (HOSPITAL_COMMUNITY): Payer: Self-pay | Admitting: *Deleted

## 2019-05-02 DIAGNOSIS — O0990 Supervision of high risk pregnancy, unspecified, unspecified trimester: Secondary | ICD-10-CM | POA: Diagnosis present

## 2019-05-02 DIAGNOSIS — Z3A31 31 weeks gestation of pregnancy: Secondary | ICD-10-CM

## 2019-05-02 DIAGNOSIS — O3483 Maternal care for other abnormalities of pelvic organs, third trimester: Secondary | ICD-10-CM

## 2019-05-02 DIAGNOSIS — Z8759 Personal history of other complications of pregnancy, childbirth and the puerperium: Secondary | ICD-10-CM

## 2019-05-02 DIAGNOSIS — Z362 Encounter for other antenatal screening follow-up: Secondary | ICD-10-CM

## 2019-05-02 DIAGNOSIS — N83202 Unspecified ovarian cyst, left side: Secondary | ICD-10-CM

## 2019-05-02 DIAGNOSIS — O219 Vomiting of pregnancy, unspecified: Secondary | ICD-10-CM

## 2019-05-02 DIAGNOSIS — N83209 Unspecified ovarian cyst, unspecified side: Secondary | ICD-10-CM

## 2019-05-02 NOTE — Consult Note (Signed)
MFM Note  This patient was seen for a follow up growth scan due to a large ovarian cyst that was noted during her prior ultrasound exams.  The patient reports feeling pressure symptoms due to the cyst.  She denies any other problems since her last exam.  She was informed that the fetal growth and amniotic fluid level appears appropriate for her gestational age.  A large left-sided 12 x 10 cm adnexal cyst is noted today.  This cystic structure has a cyst within the cyst with both cystic and solid components.  It is likely a benign cyst adenoma or a dermoid cyst, however malignancy cannot be ruled out.    As she is already in the third trimester (31+ weeks), it may be reasonable to wait until 37 weeks for delivery and removal of the cyst.  The option of having a tubal ligation performed at the time of delivery and cyst removal was discussed today.  This is her fourth child. The patient will think about a tubal ligation and will inform you of her decision at her next prenatal visit.  Should she decide to have a tubal ligation performed, she should have the appropriate paperwork signed at her next visit.    A delivery at around 37 weeks would be justified to enable the mother to receive definitive diagnosis and treatment for the large adnexal cyst.  A follow up exam was scheduled in 5 weeks.   A total of 15 minutes was spent counseling and coordinating the care for this patient.  Greater than 50% of the time was spent in direct face-to-face contact.

## 2019-05-04 ENCOUNTER — Encounter: Payer: Self-pay | Admitting: Obstetrics and Gynecology

## 2019-05-04 ENCOUNTER — Telehealth: Payer: Self-pay | Admitting: Obstetrics and Gynecology

## 2019-05-04 ENCOUNTER — Encounter: Payer: Medicaid Other | Admitting: Obstetrics and Gynecology

## 2019-05-04 NOTE — Telephone Encounter (Signed)
Attempted to call patient to get her rescheduled for her missed OB appointment. No answer, left voicemail for patient to give the office a call back to be rescheduled. No show letter mailed

## 2019-05-04 NOTE — Progress Notes (Signed)
Patient did not keep her OB appointment for 05/04/2019.  Durene Romans MD Attending Center for [employer] farm manager)

## 2019-05-06 NOTE — L&D Delivery Note (Addendum)
OB/GYN Faculty Practice Delivery Note  Paula Gilbert is a 29 y.o. [phone] at [redacted]w[redacted]d presented for IOL for large left ovarian cyst.  ROM: AROM 06/10/2019 @ 1344 with clear fluid GBS Status:  Positive/-- (12/15 0000)   Labor Progress: Patient presented to L&D for IOL for large left ovarian cyst. Initial SVE: 2/50/-1. Patient received Pitocin, AROM and epidural. Labor course was uncomplicated. She then progressed to complete.   Delivery Date/Time: 06/10/2019 @ 1507 Delivery: Called to room and patient was complete and pushing. Head delivered in LOA position and delivered with ease over the perineum. No nuchal cord present. Shoulder and body delivered in usual fashion. Infant with spontaneous cry, placed on mother's abdomen, dried and stimulated. Cord clamped x 2 after 1-minute delay, and cut by MOB. Cord blood drawn. Placenta delivered spontaneously with gentle cord traction. Fundus firm with massage and pitocin started. Labia, perineum, vagina, and cervix inspected without lacerations. Baby Weight: pending  Cord: Marginal insertion, 3 vessel Placenta: Sent to L&D Complications: None Lacerations: None  EBL: 307cc Analgesia: Epidural   Infant: APGAR (1 MIN): 9   APGAR (5 MINS): [place], DO, PGY-1 OBGYN Faculty Teaching Service  06/10/2019, 3:53 PM   OB FELLOW DELIVERY ATTESTATION  I was gloved and present for the delivery in its entirety, and I agree with the above resident's note.    Barrington Ellison, MD [facility] Family Medicine Fellow, [facility] for [employer], [place]

## 2019-05-09 ENCOUNTER — Other Ambulatory Visit: Payer: Self-pay

## 2019-05-09 ENCOUNTER — Inpatient Hospital Stay (HOSPITAL_COMMUNITY)
Admission: AD | Admit: 2019-05-09 | Discharge: 2019-05-09 | Disposition: A | Discharge status: Home / Self Care | Payer: Medicaid Other | Attending: Family Medicine | Admitting: Family Medicine

## 2019-05-09 ENCOUNTER — Encounter (HOSPITAL_COMMUNITY): Payer: Self-pay | Admitting: Family Medicine

## 2019-05-09 DIAGNOSIS — R109 Unspecified abdominal pain: Secondary | ICD-10-CM | POA: Diagnosis present

## 2019-05-09 DIAGNOSIS — Z8249 Family history of ischemic heart disease and other diseases of the circulatory system: Secondary | ICD-10-CM | POA: Diagnosis not present

## 2019-05-09 DIAGNOSIS — O2343 Unspecified infection of urinary tract in pregnancy, third trimester: Secondary | ICD-10-CM

## 2019-05-09 DIAGNOSIS — M545 Low back pain: Secondary | ICD-10-CM | POA: Diagnosis not present

## 2019-05-09 DIAGNOSIS — O26899 Other specified pregnancy related conditions, unspecified trimester: Secondary | ICD-10-CM

## 2019-05-09 DIAGNOSIS — Z833 Family history of diabetes mellitus: Secondary | ICD-10-CM | POA: Insufficient documentation

## 2019-05-09 DIAGNOSIS — Z3A32 32 weeks gestation of pregnancy: Secondary | ICD-10-CM | POA: Insufficient documentation

## 2019-05-09 DIAGNOSIS — O2342 Unspecified infection of urinary tract in pregnancy, second trimester: Secondary | ICD-10-CM | POA: Diagnosis present

## 2019-05-09 DIAGNOSIS — O2340 Unspecified infection of urinary tract in pregnancy, unspecified trimester: Secondary | ICD-10-CM | POA: Diagnosis present

## 2019-05-09 DIAGNOSIS — O99891 Other specified diseases and conditions complicating pregnancy: Secondary | ICD-10-CM | POA: Insufficient documentation

## 2019-05-09 DIAGNOSIS — Z87891 Personal history of nicotine dependence: Secondary | ICD-10-CM | POA: Insufficient documentation

## 2019-05-09 DIAGNOSIS — M549 Dorsalgia, unspecified: Secondary | ICD-10-CM | POA: Diagnosis present

## 2019-05-09 HISTORY — DX: Unspecified ovarian cyst, unspecified side: N83.209

## 2019-05-09 HISTORY — DX: Unspecified abdominal pain: O26.899

## 2019-05-09 HISTORY — DX: Unspecified abdominal pain: R10.9

## 2019-05-09 LAB — COMPREHENSIVE METABOLIC PANEL
ALT: 17 U/L (ref 0–44)
AST: 16 U/L (ref 15–41)
Albumin: 2.5 g/dL — ABNORMAL LOW (ref 3.5–5.0)
Alkaline Phosphatase: 119 U/L (ref 38–126)
Anion gap: 8 (ref 5–15)
BUN: <5 mg/dL — ABNORMAL LOW (ref 6–20)
CO2: 23 mmol/L (ref 22–32)
Calcium: 8.5 mg/dL — ABNORMAL LOW (ref 8.9–10.3)
Chloride: 107 mmol/L (ref 98–111)
Creatinine, Ser: 0.51 mg/dL (ref 0.44–1.00)
GFR calc Af Amer: >60 mL/min (ref >60)
GFR calc non Af Amer: >60 mL/min (ref >60)
Glucose, Bld: 91 mg/dL (ref 70–99)
Potassium: 3.3 mmol/L — ABNORMAL LOW (ref 3.5–5.1)
Sodium: 138 mmol/L (ref 135–145)
Total Bilirubin: 0.7 mg/dL (ref 0.3–1.2)
Total Protein: 5.2 g/dL — ABNORMAL LOW (ref 6.5–8.1)

## 2019-05-09 LAB — CBC WITH DIFFERENTIAL/PLATELET
Abs Immature Granulocytes: 0.17 10*3/uL — ABNORMAL HIGH (ref 0.00–0.07)
Basophils Absolute: 0.1 10*3/uL (ref 0.0–0.1)
Basophils Relative: 0 %
Eosinophils Absolute: 0 10*3/uL (ref 0.0–0.5)
Eosinophils Relative: 0 %
HCT: 32.3 % — ABNORMAL LOW (ref 36.0–46.0)
Hemoglobin: 10.8 g/dL — ABNORMAL LOW (ref 12.0–15.0)
Immature Granulocytes: 1 %
Lymphocytes Relative: 16 %
Lymphs Abs: 1.8 10*3/uL (ref 0.7–4.0)
MCH: 30.8 pg (ref 26.0–34.0)
MCHC: 33.4 g/dL (ref 30.0–36.0)
MCV: 92 fL (ref 80.0–100.0)
Monocytes Absolute: 1.2 10*3/uL — ABNORMAL HIGH (ref 0.1–1.0)
Monocytes Relative: 10 %
Neutro Abs: 8.4 10*3/uL — ABNORMAL HIGH (ref 1.7–7.7)
Neutrophils Relative %: 73 %
Platelets: 300 10*3/uL (ref 150–400)
RBC: 3.51 MIL/uL — ABNORMAL LOW (ref 3.87–5.11)
RDW: 12.9 % (ref 11.5–15.5)
WBC: 11.7 10*3/uL — ABNORMAL HIGH (ref 4.0–10.5)
nRBC: 0 % (ref 0.0–0.2)

## 2019-05-09 LAB — WET PREP, GENITAL
Clue Cells Wet Prep HPF POC: NONE SEEN
Sperm: NONE SEEN
Trich, Wet Prep: NONE SEEN
Yeast Wet Prep HPF POC: NONE SEEN

## 2019-05-09 LAB — URINALYSIS, ROUTINE W REFLEX MICROSCOPIC
Bilirubin Urine: NEGATIVE
Glucose, UA: NEGATIVE mg/dL
Hgb urine dipstick: NEGATIVE
Ketones, ur: NEGATIVE mg/dL
Nitrite: NEGATIVE
Protein, ur: NEGATIVE mg/dL
Specific Gravity, Urine: 1.006 (ref 1.005–1.030)
pH: 7 (ref 5.0–8.0)

## 2019-05-09 MED ORDER — ACETAMINOPHEN 500 MG PO TABS
1000.0000 mg | ORAL_TABLET | Freq: Once | ORAL | Status: AC
  Administered 2019-05-09: 1000 mg via ORAL
  Filled 2019-05-09: qty 2
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
## 2019-05-09 NOTE — Discharge Instructions (Signed)
Please go to pharmacy and pick up your Macrobid prescription, take it as prescribed. If you are not feeling any relief by next week, please discuss with your provider at your appointment.

## 2019-05-09 NOTE — MAU Note (Signed)
Pt presents to MAU with complaints of lower back and abdominal pain for an hour. Denies any vaginal bleeding or LOF

## 2019-05-10 LAB — GC/CHLAMYDIA PROBE AMP (~~LOC~~) NOT AT ARMC
Chlamydia: NEGATIVE
Comment: NEGATIVE
Comment: NORMAL
Neisseria Gonorrhea: NEGATIVE

## 2019-05-12 ENCOUNTER — Telehealth: Payer: Self-pay | Admitting: Family Medicine

## 2019-05-12 NOTE — Telephone Encounter (Signed)
Left a voice message on her phone to give [place] a call about her appointment. We had to make some changes in the office, and her appointment was affected by that change.

## 2019-05-16 ENCOUNTER — Other Ambulatory Visit: Payer: Self-pay

## 2019-05-16 ENCOUNTER — Ambulatory Visit (INDEPENDENT_AMBULATORY_CARE_PROVIDER_SITE_OTHER): Payer: Medicaid Other | Admitting: Family Medicine

## 2019-05-16 ENCOUNTER — Encounter: Payer: Self-pay | Admitting: Student

## 2019-05-16 VITALS — BP 122/85 | HR 95 | Wt 152.4 lb

## 2019-05-16 DIAGNOSIS — O0990 Supervision of high risk pregnancy, unspecified, unspecified trimester: Secondary | ICD-10-CM

## 2019-05-16 DIAGNOSIS — O0993 Supervision of high risk pregnancy, unspecified, third trimester: Secondary | ICD-10-CM

## 2019-05-16 DIAGNOSIS — N83202 Unspecified ovarian cyst, left side: Secondary | ICD-10-CM

## 2019-05-16 DIAGNOSIS — R8271 Bacteriuria: Secondary | ICD-10-CM

## 2019-05-16 DIAGNOSIS — Z3A33 33 weeks gestation of pregnancy: Secondary | ICD-10-CM

## 2019-05-16 DIAGNOSIS — O2343 Unspecified infection of urinary tract in pregnancy, third trimester: Secondary | ICD-10-CM

## 2019-05-16 DIAGNOSIS — Z8759 Personal history of other complications of pregnancy, childbirth and the puerperium: Secondary | ICD-10-CM

## 2019-05-16 NOTE — Progress Notes (Signed)
   PRENATAL VISIT NOTE  Subjective:  Paula Gilbert is a 29 y.o. [phone] at [redacted]w[redacted]d being seen today for ongoing prenatal care.  She is currently monitored for the following issues for this high-risk pregnancy and has GBS bacteriuria; Trichomonal vaginitis during pregnancy in first trimester; Left ovarian cyst; Nausea/vomiting in pregnancy; Chlamydia infection affecting pregnancy; Supervision of high risk pregnancy, antepartum; History of pre-eclampsia; Tobacco smoking affecting pregnancy in first trimester; Subchorionic hemorrhage in first trimester; UTI (urinary tract infection) during pregnancy; Back pain affecting pregnancy; and Abdominal cramping affecting pregnancy on their problem list.  Patient reports pelvic pain, worse at night.  Contractions: Not present. Vag. Bleeding: None.  Movement: Present. Denies leaking of fluid.   The following portions of the patient's history were reviewed and updated as appropriate: allergies, current medications, past family history, past medical history, past social history, past surgical history and problem list.   Objective:   Vitals:   05/16/19 1520  BP: 122/85  Pulse: 95  Weight: 152 lb 6.4 oz (69.1 kg)    Fetal Status: Fetal Heart Rate (bpm): 147 Fundal Height: 33 cm Movement: Present     General:  Alert, oriented and cooperative. Patient is in no acute distress.  Skin: Skin is warm and dry. No rash noted.   Cardiovascular: Normal heart rate noted  Respiratory: Normal respiratory effort, no problems with respiration noted  Abdomen: Soft, gravid, appropriate for gestational age.  Pain/Pressure: Present     Pelvic: Cervical exam deferred        Extremities: Normal range of motion.  Edema: Trace  Mental Status: Normal mood and affect. Normal behavior. Normal judgment and thought content.   Assessment and Plan:  Pregnancy: [id number] at [redacted]w[redacted]d 1. Supervision of high risk pregnancy, antepartum FHT and FH normal  2. History of pre-eclampsia On  ASA 81mg   3. Left ovarian cyst Reviewed images - it appears more fluid filled. This may be more amenable to laparoscopic cystectomy after delivery. This would reduce maternal risk significantly. Will review with Research officer, trade [employer].  4. GBS bacteriuria Intrapartum ppx  5. Urinary tract infection in mother during third trimester of pregnancy TOC UCx end of the month.  Preterm labor symptoms and general obstetric precautions including but not limited to vaginal bleeding, contractions, leaking of fluid and fetal movement were reviewed in detail with the patient. Please refer to After Visit Summary for other counseling recommendations.   Return in about 2 weeks (around 05/30/2019) for HR OB f/u, In Office.  Future Appointments  Date Time Provider [place]  05/16/2019  3:55 PM [place] [facility]  06/06/2019  8:45 AM [place] NURSE [place] [facility]-US  06/06/2019  8:45 AM [place] 2 WH-MFCUS MFC-US    Truett Mainland, DO

## 2019-05-18 ENCOUNTER — Encounter: Payer: Self-pay | Admitting: *Deleted

## 2019-06-03 ENCOUNTER — Encounter (HOSPITAL_COMMUNITY): Payer: Self-pay | Admitting: *Deleted

## 2019-06-03 ENCOUNTER — Other Ambulatory Visit: Payer: Self-pay

## 2019-06-03 ENCOUNTER — Encounter: Payer: Self-pay | Admitting: Family Medicine

## 2019-06-03 ENCOUNTER — Telehealth (HOSPITAL_COMMUNITY): Payer: Self-pay | Admitting: *Deleted

## 2019-06-03 ENCOUNTER — Other Ambulatory Visit (HOSPITAL_COMMUNITY)
Admission: RE | Admit: 2019-06-03 | Discharge: 2019-06-03 | Disposition: A | Discharge status: Home / Self Care | Payer: Medicaid Other | Source: Ambulatory Visit | Attending: Family Medicine | Admitting: Family Medicine

## 2019-06-03 ENCOUNTER — Ambulatory Visit (INDEPENDENT_AMBULATORY_CARE_PROVIDER_SITE_OTHER): Payer: Medicaid Other | Admitting: Family Medicine

## 2019-06-03 VITALS — BP 122/87 | HR 88 | Wt 156.0 lb

## 2019-06-03 DIAGNOSIS — O0993 Supervision of high risk pregnancy, unspecified, third trimester: Secondary | ICD-10-CM | POA: Diagnosis not present

## 2019-06-03 DIAGNOSIS — O0990 Supervision of high risk pregnancy, unspecified, unspecified trimester: Secondary | ICD-10-CM | POA: Insufficient documentation

## 2019-06-03 DIAGNOSIS — R8271 Bacteriuria: Secondary | ICD-10-CM

## 2019-06-03 DIAGNOSIS — N83202 Unspecified ovarian cyst, left side: Secondary | ICD-10-CM

## 2019-06-03 DIAGNOSIS — Z3A36 36 weeks gestation of pregnancy: Secondary | ICD-10-CM

## 2019-06-03 DIAGNOSIS — Z8759 Personal history of other complications of pregnancy, childbirth and the puerperium: Secondary | ICD-10-CM

## 2019-06-03 NOTE — Progress Notes (Signed)
   PRENATAL VISIT NOTE  Subjective:  Paula Gilbert is a 29 y.o. [phone] at [redacted]w[redacted]d being seen today for ongoing prenatal care.  She is currently monitored for the following issues for this high-risk pregnancy and has GBS bacteriuria; Trichomonal vaginitis during pregnancy in first trimester; Left ovarian cyst; Nausea/vomiting in pregnancy; Chlamydia infection affecting pregnancy; Supervision of high risk pregnancy, antepartum; History of pre-eclampsia; Tobacco smoking affecting pregnancy in first trimester; UTI (urinary tract infection) during pregnancy; and Abdominal cramping affecting pregnancy on their problem list.  Patient reports intermittent left pelvic and flank pain. Severe at times..  Contractions: Irritability. Vag. Bleeding: None.  Movement: Present. Denies leaking of fluid.   The following portions of the patient's history were reviewed and updated as appropriate: allergies, current medications, past family history, past medical history, past social history, past surgical history and problem list.   Objective:   Vitals:   06/03/19 0839  BP: 122/87  Pulse: 88  Weight: 156 lb (70.8 kg)    Fetal Status: Fetal Heart Rate (bpm): 145 Fundal Height: 36 cm Movement: Present  Presentation: Vertex  General:  Alert, oriented and cooperative. Patient is in no acute distress.  Skin: Skin is warm and dry. No rash noted.   Cardiovascular: Normal heart rate noted  Respiratory: Normal respiratory effort, no problems with respiration noted  Abdomen: Soft, gravid, appropriate for gestational age.  Pain/Pressure: Present     Pelvic: Cervical exam performed Dilation: 2 Effacement (%): 50 Station: -1. No pooling  Extremities: Normal range of motion.  Edema: Trace  Mental Status: Normal mood and affect. Normal behavior. Normal judgment and thought content.   Assessment and Plan:  Pregnancy: [id number] at [redacted]w[redacted]d 1. Supervision of high risk pregnancy, antepartum FHT and FH normal.   2. History  of pre-eclampsia BP normal  3. Left ovarian cyst I discussed case with MFM. As patient having symptoms, reasonable to induce as cannot rule out intermittent torsion. Discussed cystectomy - possible that has torsion following delivery - may need surgery if this happens. If not, then waiting 3-4 weeks following delivery would be best.  4. GBS bacteriuria Intrapartum PPx  Preterm labor symptoms and general obstetric precautions including but not limited to vaginal bleeding, contractions, leaking of fluid and fetal movement were reviewed in detail with the patient. Please refer to After Visit Summary for other counseling recommendations.   Return in about 5 weeks (around 07/08/2019) for Postpartum Exam.  Future Appointments  Date Time Provider [place]  06/06/2019  8:45 AM [place] M[facility]-US  06/06/2019  8:45 AM WH-MFC [place] 2 WH-MFCUS MFC-US  06/10/2019  7:00 AM MC-LD [place] None  07/08/2019  9:35 AM [place], Mervyn Skeeters, CNM [facility] [place], DO

## 2019-06-03 NOTE — Telephone Encounter (Signed)
Preadmission screen  

## 2019-06-03 NOTE — Addendum Note (Signed)
Addended by: Annabell Howells on: 06/03/2019 09:33 AM   Modules accepted: Orders

## 2019-06-03 NOTE — Progress Notes (Addendum)
Pt reports one episode of watery vaginal discharge on Monday. Pt states she felt a "gush" and her underwear were wet. No watery discharge since that time.   IOL scheduled by [facility], DO for 06/10/19 and information faxed to L & D.   Apolonio Schneiders RN 06/03/19

## 2019-06-05 ENCOUNTER — Other Ambulatory Visit: Payer: Self-pay | Admitting: Family Medicine

## 2019-06-06 ENCOUNTER — Encounter (HOSPITAL_COMMUNITY): Payer: Self-pay

## 2019-06-06 ENCOUNTER — Ambulatory Visit (HOSPITAL_COMMUNITY)
Admission: RE | Admit: 2019-06-06 | Discharge: 2019-06-06 | Disposition: A | Discharge status: Home / Self Care | Payer: Medicaid Other | Source: Ambulatory Visit | Attending: Obstetrics and Gynecology | Admitting: Obstetrics and Gynecology

## 2019-06-06 ENCOUNTER — Other Ambulatory Visit: Payer: Self-pay

## 2019-06-06 ENCOUNTER — Ambulatory Visit (HOSPITAL_COMMUNITY): Payer: Medicaid Other | Admitting: *Deleted

## 2019-06-06 DIAGNOSIS — Z3A36 36 weeks gestation of pregnancy: Secondary | ICD-10-CM | POA: Insufficient documentation

## 2019-06-06 DIAGNOSIS — R109 Unspecified abdominal pain: Secondary | ICD-10-CM

## 2019-06-06 DIAGNOSIS — N83209 Unspecified ovarian cyst, unspecified side: Secondary | ICD-10-CM

## 2019-06-06 DIAGNOSIS — O26893 Other specified pregnancy related conditions, third trimester: Secondary | ICD-10-CM

## 2019-06-06 DIAGNOSIS — O3483 Maternal care for other abnormalities of pelvic organs, third trimester: Secondary | ICD-10-CM | POA: Insufficient documentation

## 2019-06-06 DIAGNOSIS — N83202 Unspecified ovarian cyst, left side: Secondary | ICD-10-CM | POA: Diagnosis not present

## 2019-06-06 DIAGNOSIS — O26899 Other specified pregnancy related conditions, unspecified trimester: Secondary | ICD-10-CM

## 2019-06-06 DIAGNOSIS — O0990 Supervision of high risk pregnancy, unspecified, unspecified trimester: Secondary | ICD-10-CM

## 2019-06-06 DIAGNOSIS — Z8759 Personal history of other complications of pregnancy, childbirth and the puerperium: Secondary | ICD-10-CM

## 2019-06-06 DIAGNOSIS — O219 Vomiting of pregnancy, unspecified: Secondary | ICD-10-CM

## 2019-06-06 LAB — GC/CHLAMYDIA PROBE AMP (~~LOC~~) NOT AT ARMC
Chlamydia: NEGATIVE
Comment: NEGATIVE
Comment: NORMAL
Neisseria Gonorrhea: NEGATIVE

## 2019-06-08 ENCOUNTER — Other Ambulatory Visit (HOSPITAL_COMMUNITY)
Admission: RE | Admit: 2019-06-08 | Discharge: 2019-06-08 | Disposition: A | Discharge status: Home / Self Care | Payer: Medicaid Other | Source: Ambulatory Visit | Attending: Family Medicine | Admitting: Family Medicine

## 2019-06-08 DIAGNOSIS — Z20822 Contact with and (suspected) exposure to covid-19: Secondary | ICD-10-CM | POA: Insufficient documentation

## 2019-06-08 DIAGNOSIS — Z01812 Encounter for preprocedural laboratory examination: Secondary | ICD-10-CM | POA: Insufficient documentation

## 2019-06-08 LAB — SARS CORONAVIRUS 2 (TAT 6-24 HRS): SARS Coronavirus 2: NEGATIVE

## 2019-06-09 ENCOUNTER — Other Ambulatory Visit (HOSPITAL_COMMUNITY): Payer: Self-pay | Admitting: Advanced Practice Midwife

## 2019-06-10 ENCOUNTER — Inpatient Hospital Stay (HOSPITAL_COMMUNITY): Payer: Medicaid Other

## 2019-06-10 ENCOUNTER — Encounter (HOSPITAL_COMMUNITY): Payer: Self-pay | Admitting: Family Medicine

## 2019-06-10 ENCOUNTER — Inpatient Hospital Stay (HOSPITAL_COMMUNITY): Payer: Medicaid Other | Admitting: Anesthesiology

## 2019-06-10 ENCOUNTER — Inpatient Hospital Stay (HOSPITAL_COMMUNITY)
Admission: AD | Admit: 2019-06-10 | Discharge: 2019-06-12 | DRG: 807 | Disposition: A | Discharge status: Home / Self Care | Payer: Medicaid Other | Attending: Obstetrics & Gynecology | Admitting: Obstetrics & Gynecology

## 2019-06-10 ENCOUNTER — Other Ambulatory Visit: Payer: Self-pay

## 2019-06-10 DIAGNOSIS — O3483 Maternal care for other abnormalities of pelvic organs, third trimester: Principal | ICD-10-CM | POA: Diagnosis present

## 2019-06-10 DIAGNOSIS — O43123 Velamentous insertion of umbilical cord, third trimester: Secondary | ICD-10-CM | POA: Diagnosis present

## 2019-06-10 DIAGNOSIS — Z3A36 36 weeks gestation of pregnancy: Secondary | ICD-10-CM

## 2019-06-10 DIAGNOSIS — O99824 Streptococcus B carrier state complicating childbirth: Secondary | ICD-10-CM | POA: Diagnosis present

## 2019-06-10 DIAGNOSIS — Z3490 Encounter for supervision of normal pregnancy, unspecified, unspecified trimester: Secondary | ICD-10-CM

## 2019-06-10 DIAGNOSIS — N83202 Unspecified ovarian cyst, left side: Secondary | ICD-10-CM | POA: Diagnosis present

## 2019-06-10 DIAGNOSIS — Z87891 Personal history of nicotine dependence: Secondary | ICD-10-CM | POA: Diagnosis not present

## 2019-06-10 DIAGNOSIS — Z8759 Personal history of other complications of pregnancy, childbirth and the puerperium: Secondary | ICD-10-CM

## 2019-06-10 DIAGNOSIS — Z3A37 37 weeks gestation of pregnancy: Secondary | ICD-10-CM

## 2019-06-10 DIAGNOSIS — R8271 Bacteriuria: Secondary | ICD-10-CM | POA: Diagnosis present

## 2019-06-10 LAB — CBC
HCT: 33.6 % — ABNORMAL LOW (ref 36.0–46.0)
Hemoglobin: 11.3 g/dL — ABNORMAL LOW (ref 12.0–15.0)
MCH: 30.8 pg (ref 26.0–34.0)
MCHC: 33.6 g/dL (ref 30.0–36.0)
MCV: 91.6 fL (ref 80.0–100.0)
Platelets: 315 10*3/uL (ref 150–400)
RBC: 3.67 MIL/uL — ABNORMAL LOW (ref 3.87–5.11)
RDW: 13.1 % (ref 11.5–15.5)
WBC: 10.3 10*3/uL (ref 4.0–10.5)
nRBC: 0 % (ref 0.0–0.2)

## 2019-06-10 LAB — TYPE AND SCREEN
ABO/RH(D): B POS
Antibody Screen: NEGATIVE

## 2019-06-10 LAB — RPR: RPR Ser Ql: NONREACTIVE

## 2019-06-10 MED ORDER — OXYTOCIN BOLUS FROM INFUSION
500.0000 mL | Freq: Once | INTRAVENOUS | Status: AC
  Administered 2019-06-10: 15:00:00 500 mL via INTRAVENOUS

## 2019-06-10 MED ORDER — OXYCODONE-ACETAMINOPHEN 5-325 MG PO TABS: 1.0000 | ORAL_TABLET | ORAL | Status: DC | PRN

## 2019-06-10 MED ORDER — TERBUTALINE SULFATE 1 MG/ML IJ SOLN: 0.2500 mg | Freq: Once | INTRAMUSCULAR | Status: DC | PRN

## 2019-06-10 MED ORDER — FENTANYL-BUPIVACAINE-NACL 0.5-0.125-0.9 MG/250ML-% EP SOLN: 12.0000 mL/h | EPIDURAL | Status: DC | PRN

## 2019-06-10 MED ORDER — PHENYLEPHRINE 40 MCG/ML (10ML) SYRINGE FOR IV PUSH (FOR BLOOD PRESSURE SUPPORT): 80.0000 ug | PREFILLED_SYRINGE | INTRAVENOUS | Status: DC | PRN

## 2019-06-10 MED ORDER — SOD CITRATE-CITRIC ACID 500-334 MG/5ML PO SOLN: 30.0000 mL | ORAL | Status: DC | PRN

## 2019-06-10 MED ORDER — IBUPROFEN 600 MG PO TABS
600.0000 mg | ORAL_TABLET | Freq: Three times a day (TID) | ORAL | Status: DC | PRN
  Administered 2019-06-10 – 2019-06-12 (×5): 600 mg via ORAL
  Filled 2019-06-10 (×5): qty 1

## 2019-06-10 MED ORDER — OXYCODONE-ACETAMINOPHEN 5-325 MG PO TABS: 2.0000 | ORAL_TABLET | ORAL | Status: DC | PRN

## 2019-06-10 MED ORDER — ONDANSETRON HCL 4 MG PO TABS: 4.0000 mg | ORAL_TABLET | ORAL | Status: DC | PRN

## 2019-06-10 MED ORDER — LACTATED RINGERS IV SOLN: INTRAVENOUS | Status: DC

## 2019-06-10 MED ORDER — SODIUM CHLORIDE (PF) 0.9 % IJ SOLN
INTRAMUSCULAR | Status: DC | PRN
  Administered 2019-06-10: 12 mL/h via EPIDURAL

## 2019-06-10 MED ORDER — OXYTOCIN 40 UNITS IN NORMAL SALINE INFUSION - SIMPLE MED
1.0000 m[IU]/min | INTRAVENOUS | Status: DC
  Administered 2019-06-10: 08:00:00 2 m[IU]/min via INTRAVENOUS
  Filled 2019-06-10: qty 1000

## 2019-06-10 MED ORDER — MEASLES, MUMPS & RUBELLA VAC IJ SOLR: 0.5000 mL | Freq: Once | INTRAMUSCULAR | Status: DC

## 2019-06-10 MED ORDER — WITCH HAZEL-GLYCERIN EX PADS: 1.0000 "application " | MEDICATED_PAD | CUTANEOUS | Status: DC | PRN

## 2019-06-10 MED ORDER — ONDANSETRON HCL 4 MG/2ML IJ SOLN: 4.0000 mg | INTRAMUSCULAR | Status: DC | PRN

## 2019-06-10 MED ORDER — DIPHENHYDRAMINE HCL 50 MG/ML IJ SOLN: 12.5000 mg | INTRAMUSCULAR | Status: DC | PRN

## 2019-06-10 MED ORDER — BENZOCAINE-MENTHOL 20-0.5 % EX AERO: 1.0000 "application " | INHALATION_SPRAY | CUTANEOUS | Status: DC | PRN

## 2019-06-10 MED ORDER — OXYTOCIN 40 UNITS IN NORMAL SALINE INFUSION - SIMPLE MED
2.5000 [IU]/h | INTRAVENOUS | Status: DC
  Administered 2019-06-10: 16:00:00 2.5 [IU]/h via INTRAVENOUS

## 2019-06-10 MED ORDER — ACETAMINOPHEN 325 MG PO TABS
650.0000 mg | ORAL_TABLET | Freq: Four times a day (QID) | ORAL | Status: DC | PRN
  Administered 2019-06-10 – 2019-06-11 (×2): 650 mg via ORAL
  Filled 2019-06-10 (×3): qty 2

## 2019-06-10 MED ORDER — PENICILLIN G POT IN DEXTROSE 60000 UNIT/ML IV SOLN
3.0000 10*6.[IU] | INTRAVENOUS | Status: DC
  Administered 2019-06-10: 12:00:00 3 10*6.[IU] via INTRAVENOUS
  Filled 2019-06-10: qty 50

## 2019-06-10 MED ORDER — SIMETHICONE 80 MG PO CHEW: 80.0000 mg | CHEWABLE_TABLET | ORAL | Status: DC | PRN

## 2019-06-10 MED ORDER — SODIUM CHLORIDE 0.9 % IV SOLN
5.0000 10*6.[IU] | Freq: Once | INTRAVENOUS | Status: AC
  Administered 2019-06-10: 08:00:00 5 10*6.[IU] via INTRAVENOUS
  Filled 2019-06-10: qty 5

## 2019-06-10 MED ORDER — LACTATED RINGERS IV SOLN
500.0000 mL | Freq: Once | INTRAVENOUS | Status: DC
  Administered 2019-06-10: 12:00:00 500 mL via INTRAVENOUS

## 2019-06-10 MED ORDER — DIPHENHYDRAMINE HCL 25 MG PO CAPS: 25.0000 mg | ORAL_CAPSULE | Freq: Four times a day (QID) | ORAL | Status: DC | PRN

## 2019-06-10 MED ORDER — PRENATAL MULTIVITAMIN CH
1.0000 | ORAL_TABLET | Freq: Every day | ORAL | Status: DC
  Administered 2019-06-11: 11:00:00 1 via ORAL
  Filled 2019-06-10 (×2): qty 1

## 2019-06-10 MED ORDER — LIDOCAINE HCL (PF) 1 % IJ SOLN
INTRAMUSCULAR | Status: DC | PRN
  Administered 2019-06-10: 6 mL via EPIDURAL

## 2019-06-10 MED ORDER — LIDOCAINE HCL (PF) 1 % IJ SOLN: 30.0000 mL | INTRAMUSCULAR | Status: DC | PRN

## 2019-06-10 MED ORDER — LACTATED RINGERS IV SOLN: 500.0000 mL | INTRAVENOUS | Status: DC | PRN

## 2019-06-10 MED ORDER — DIBUCAINE (PERIANAL) 1 % EX OINT: 1.0000 "application " | TOPICAL_OINTMENT | CUTANEOUS | Status: DC | PRN

## 2019-06-10 MED ORDER — ACETAMINOPHEN 325 MG PO TABS: 650.0000 mg | ORAL_TABLET | ORAL | Status: DC | PRN

## 2019-06-10 MED ORDER — FENTANYL-BUPIVACAINE-NACL 0.5-0.125-0.9 MG/250ML-% EP SOLN
EPIDURAL | Status: AC
  Filled 2019-06-10: qty 250

## 2019-06-10 MED ORDER — ONDANSETRON HCL 4 MG/2ML IJ SOLN: 4.0000 mg | Freq: Four times a day (QID) | INTRAMUSCULAR | Status: DC | PRN

## 2019-06-10 MED ORDER — EPHEDRINE 5 MG/ML INJ: 10.0000 mg | INTRAVENOUS | Status: DC | PRN

## 2019-06-10 MED ORDER — MEDROXYPROGESTERONE ACETATE 150 MG/ML IM SUSP
150.0000 mg | Freq: Once | INTRAMUSCULAR | Status: AC
  Administered 2019-06-11: 11:00:00 150 mg via INTRAMUSCULAR
  Filled 2019-06-10: qty 1

## 2019-06-10 MED ORDER — SENNOSIDES-DOCUSATE SODIUM 8.6-50 MG PO TABS
2.0000 | ORAL_TABLET | ORAL | Status: DC
  Administered 2019-06-10 – 2019-06-11 (×2): 2 via ORAL
  Filled 2019-06-10 (×2): qty 2

## 2019-06-10 MED ORDER — COCONUT OIL OIL: 1.0000 "application " | TOPICAL_OIL | Status: DC | PRN

## 2019-06-10 MED ORDER — TETANUS-DIPHTH-ACELL PERTUSSIS 5-2.5-18.5 LF-MCG/0.5 IM SUSP: 0.5000 mL | Freq: Once | INTRAMUSCULAR | Status: DC

## 2019-06-10 NOTE — Progress Notes (Signed)
Labor Progress Note Paula Gilbert is a 29 y.o. [phone] at [redacted]w[redacted]d presented for IOL for large left ovarian cyst. S: Comfortable with epidural.  O:  BP 125/89   Pulse 86   Temp 98.6 F (37 C) (Oral)   Resp 16   Ht 5\' 7"  (1.702 m)   Wt 70 kg   LMP  (LMP Unknown)   BMI 24.17 kg/m  EFM: 130. moderate variability, pos accels, no decels, reactive TOCO: q2-30m  CVE: Dilation: 3 Effacement (%): 50 Station: -1 Presentation: Vertex Exam by:: Dr. Marice Potter    A&P: 29 y.o. [id number] [redacted]w[redacted]d IOL for large left ovarian cyst. #Labor: Progressing well. AROM with clear fluid at this exam. Cont Pit titration. Anticipate SVD. #Pain: epidural #FWB: Cat I #GBS positive; PCN #Hx of Pre-E: normal range BP's; two elevated in mild range but will draw labs if > 4 hours apart; asymptomatic   Chauncey Mann, MD 1:53 PM
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
## 2019-06-10 NOTE — Discharge Summary (Signed)
Postpartum Discharge Summary  Date of Service updated 06/12/2019     Patient Name: Paula Gilbert DOB: [birth year] MRN: [id number]  Date of admission: 06/10/2019 Delivering Provider: Chauncey Mann   Date of discharge: 06/12/2019  Admitting diagnosis: Encounter for induction of labor [Z34.90] Intrauterine pregnancy: [redacted]w[redacted]d    Secondary diagnosis:  Active Problems:   GBS bacteriuria   Left ovarian cyst   Encounter for induction of labor   [redacted] weeks gestation of pregnancy   Vaginal delivery  Additional problems: None     Discharge diagnosis: Term Pregnancy Delivered                                                                                                Post partum procedures:None  Augmentation: AROM and Pitocin  Complications: None  Hospital course:  Induction of Labor With Vaginal Delivery   29y.o. yo [id number] admitted to the hospital 06/10/2019 for induction of labor.  Indication for induction: large left ovarian cyst; deliverey recommended by MFM.  Patient had an uncomplicated labor course as follows: Patient presented to L&D for IOL for large left ovarian cyst. Initial SVE: 2/50/-1. Patient received Pitocin, AROM and epidural. Labor course was uncomplicated. She then progressed to complete.   On day of discharge, the patient states that she is happy to go home.  She is complaining of no headaches, vision changes, right upper quadrant pain or extremity swelling.  Membrane Rupture Time/Date: 1:44 PM ,06/10/2019   Intrapartum Procedures: Episiotomy: None [1]                                         Lacerations:  None [1]  Patient had delivery of a Viable infant.  Information for the patient's newborn:  [place], [place]0[[id number]]Delivery Method: Vaginal, Spontaneous(Filed from Delivery Summary)    06/10/2019  Details of delivery can be found in separate delivery note. Received Depo 06/11/2019. She did not complain of pain from left ovary and will plan  to f/u outpatient in 3-4 weeks for further management. Patient had a routine postpartum course. Patient is discharged home 06/12/19. Delivery time: 3:07 PM    Magnesium Sulfate received: No BMZ received: No Rhophylac:No MMR:No Transfusion:No  Physical exam  Vitals:   06/11/19 1445 06/11/19 1935 06/11/19 2059 06/12/19 0544  BP: 130/87 128/85 122/75 131/86  Pulse: 60 60 60 67  Resp: _0 Temp: 98.3 F (36.8 C) 98.4 F (36.9 C) 97.8 F (36.6 C) 98 F (36.7 C)  TempSrc: Oral Oral Oral Oral  SpO2: 99% 100% 99% 100%  Weight:      Height:       General: alert Lochia: appropriate Uterine Fundus: firm Incision: N/A DVT Evaluation: No evidence of DVT seen on physical exam. Negative Homan's sign. No cords or calf tenderness. No significant calf/ankle edema. Labs: Lab Results  Component Value Date   WBC 10.3 06/10/2019   HGB 11.3 (L) 06/10/2019   HCT 33.6 (L)  06/10/2019   MCV 91.6 06/10/2019   PLT 315 06/10/2019   CMP Latest Ref Rng & Units 05/09/2019  Glucose 70 - 99 mg/dL 91  BUN 6 - 20 mg/dL <5(L)  Creatinine 0.44 - 1.00 mg/dL 0.51  Sodium 135 - 145 mmol/L 138  Potassium 3.5 - 5.1 mmol/L 3.3(L)  Chloride 98 - 111 mmol/L 107  CO2 22 - 32 mmol/L 23  Calcium 8.9 - 10.3 mg/dL 8.5(L)  Total Protein 6.5 - 8.1 g/dL 5.2(L)  Total Bilirubin 0.3 - 1.2 mg/dL 0.7  Alkaline Phos 38 - 126 U/L 119  AST 15 - 41 U/L 16  ALT 0 - 44 U/L 17    Discharge instruction: per After Visit Summary and "Baby and Me Booklet".  After visit meds:  Allergies as of 06/12/2019   No Known Allergies     Medication List    STOP taking these medications   aspirin EC 81 MG tablet     TAKE these medications   ibuprofen 600 MG tablet Commonly known as: ADVIL Take 1 tablet (600 mg total) by mouth every 8 (eight) hours as needed for mild pain.   multivitamin-prenatal 27-0.8 MG Tabs tablet Take 1 tablet by mouth daily at 12 noon.       Diet: routine diet  Activity: Advance as  tolerated. Pelvic rest for 6 weeks.   Outpatient follow up: 3-4 weeks Follow up Appt: Will call with appointment date and time   Follow up Visit: [place] [facility] Follow up in 3 week(s).   Specialty: Obstetrics and Gynecology Why: to determine plan of care for ovarian cyst Contact information: [address], [place] [id number] [place] [phone] [place] [facility] Follow up.   Specialty: Obstetrics and Gynecology Why: as needed in pregnancy-related emergencies or symptoms of ovarian torsion (severe abd pain) Contact information: [address] [id number] [place] [phone]            Please schedule this patient for Postpartum visit in: 3- 4 weeks with the following provider: MD OBGYN (not fellow or App) - to discuss ovarian cyst Virtual Low risk pregnancy complicated by: ovarian cyst Delivery mode:  SVD Anticipated Birth Control:  Depo PP Procedures needed: None  Schedule Integrated BH visit: no     Newborn Data: Live born female  Birth Weight: 2570g APGAR: 68, 10  Newborn Delivery   Birth date/time: 06/10/2019 15:07:00 Delivery type: Vaginal, Spontaneous      Baby Feeding: Breast Disposition:home with mother   06/12/2019 Manya Silvas, CNM

## 2019-06-10 NOTE — Anesthesia Preprocedure Evaluation (Signed)
Anesthesia Evaluation  Patient identified by MRN, date of birth, ID band Patient awake    Reviewed: Allergy & Precautions, H&P , NPO status , Patient's Chart, lab work & pertinent test results, reviewed documented beta blocker date and time   Airway Mallampati: II  TM Distance: >3 FB Neck ROM: full    Dental no notable dental hx. (+) Dental Advisory Given, Teeth Intact   Pulmonary neg pulmonary ROS, former smoker,    Pulmonary exam normal breath sounds clear to auscultation       Cardiovascular hypertension, negative cardio ROS Normal cardiovascular exam Rhythm:regular Rate:Normal     Neuro/Psych negative neurological ROS  negative psych ROS   GI/Hepatic negative GI ROS, Neg liver ROS,   Endo/Other  negative endocrine ROS  Renal/GU negative Renal ROS  negative genitourinary   Musculoskeletal   Abdominal   Peds  Hematology negative hematology ROS (+)   Anesthesia Other Findings   Reproductive/Obstetrics (+) Pregnancy                             Anesthesia Physical Anesthesia Plan  ASA: II  Anesthesia Plan: Epidural   Post-op Pain Management:    Induction:   PONV Risk Score and Plan:   Airway Management Planned:   Additional Equipment:   Intra-op Plan:   Post-operative Plan:   Informed Consent: I have reviewed the patients History and Physical, chart, labs and discussed the procedure including the risks, benefits and alternatives for the proposed anesthesia with the patient or authorized representative who has indicated his/her understanding and acceptance.       Plan Discussed with: Anesthesiologist  Anesthesia Plan Comments:         Anesthesia Quick Evaluation

## 2019-06-10 NOTE — Anesthesia Procedure Notes (Signed)
Epidural Patient location during procedure: OB Start time: 06/10/2019 12:07 PM End time: 06/10/2019 12:12 PM  Staffing Anesthesiologist: Janeece Riggers, MD  Preanesthetic Checklist Completed: patient identified, IV checked, site marked, risks and benefits discussed, surgical consent, monitors and equipment checked, pre-op evaluation and timeout performed  Epidural Patient position: sitting Prep: DuraPrep and site prepped and draped Patient monitoring: continuous pulse ox and blood pressure Approach: midline Location: L3-L4 Injection technique: LOR air  Needle:  Needle type: Tuohy  Needle gauge: 17 G Needle length: 9 cm and 9 Needle insertion depth: 5 cm cm Catheter type: closed end flexible Catheter size: 19 Gauge Catheter at skin depth: 10 cm Test dose: negative  Assessment Events: blood not aspirated, injection not painful, no injection resistance, no paresthesia and negative IV test

## 2019-06-11 MED ORDER — OXYCODONE-ACETAMINOPHEN 5-325 MG PO TABS
1.0000 | ORAL_TABLET | ORAL | Status: DC | PRN
  Administered 2019-06-11 – 2019-06-12 (×5): 1 via ORAL
  Filled 2019-06-11 (×5): qty 1

## 2019-06-11 NOTE — Anesthesia Postprocedure Evaluation (Signed)
Anesthesia Post Note  Patient: Paula Gilbert  Procedure(s) Performed: AN AD [place]     Patient location during evaluation: Mother Baby Anesthesia Type: Epidural Level of consciousness: awake and alert Pain management: pain level controlled Vital Signs Assessment: post-procedure vital signs reviewed and stable Respiratory status: spontaneous breathing, nonlabored ventilation and respiratory function stable Cardiovascular status: stable Postop Assessment: no headache, no backache and epidural receding Anesthetic complications: no    Last Vitals:  Vitals:   06/11/19 0323 06/11/19 0625  BP: 137/84 124/84  Pulse: 60 60  Resp: 18 16  Temp: 37.1 C 36.7 C  SpO2: 98% 99%    Last Pain:  Vitals:   06/11/19 0625  TempSrc: Oral  PainSc: 4                  Jacorey Donaway

## 2019-06-11 NOTE — Lactation Note (Signed)
This note was copied from a baby's chart. Lactation Consultation Note  Patient Name: Paula Gilbert [id number] Date: 06/11/2019  68 hour female infant. LC entered room mom and infant asleep.   Maternal Data    Feeding Feeding Type: Breast Fed  LATCH Score                   Interventions    Lactation Tools Discussed/Used     Consult Status      Vicente Serene 06/11/2019, 5:10 AM

## 2019-06-11 NOTE — Lactation Note (Signed)
This note was copied from a baby's chart. Lactation Consultation Note: P4, Infant 21 hours old, 66 week. LC was ask to assess infant feeding.  Mother in laid back breastfeeding position. Infant suckling but was shallow.  Assist mother with hugging infant closer to breast and doing breast compression.  Mother reports that infant is  Feeding very frequently.  Recommend that mother start supplementing infant. Staff nurse Santiago Glad obtained donor milk for mother .  Infant was given 10 ml of donor milk . Supplemental guidelines reviewed.  Mother receptive to all teaching. . Mother to continue to cue base feed and do frequent STS. Mother is an experienced breastfeeding mother and reports that all her children where early infants.  Mother to page as needed.  Patient Name: Paula Gilbert [id number] Date: 06/11/2019 Reason for consult: Follow-up assessment   Maternal Data    Feeding Feeding Type: Breast Fed  LATCH Score Latch: Grasps breast easily, tongue down, lips flanged, rhythmical sucking.  Audible Swallowing: A few with stimulation  Type of Nipple: Everted at rest and after stimulation  Comfort (Breast/Nipple): Soft / non-tender  Hold (Positioning): No assistance needed to correctly position infant at breast.  LATCH Score: 9  Interventions Interventions: Assisted with latch;Hand express;Breast compression;Adjust position;Support pillows;Position options;DEBP  Lactation Tools Discussed/Used     Consult Status Consult Status: Follow-up Date: 06/12/19 Follow-up type: In-patient    Jess Barters [facility] 06/11/2019, 2:40 PM

## 2019-06-11 NOTE — Progress Notes (Signed)
Post Partum Day #1 Subjective: no complaints, up ad lib, voiding and tolerating PO Patient is sitting up on side of bed, breasting feeing and talking on the phone. Requesting something stronger than ibuprofen and percocet for pain.   Objective: Blood pressure 124/84, pulse 60, temperature 98.1 F (36.7 C), temperature source Oral, resp. rate 16, height 5\' 7"  (1.702 m), weight 70 kg, SpO2 99 %, unknown if currently breastfeeding.  Physical Exam:  General: no distress Lochia: appropriate Uterine Fundus: firm Incision: NA  DVT Evaluation: No evidence of DVT seen on physical exam.  Recent Labs    06/10/19 0758  HGB 11.3*  HCT 33.6*    Assessment/Plan: Plan for discharge tomorrow -Will order percocet PRN for cramping.    LOS: 1 day   Mervyn Skeeters [facility] 06/11/2019, 10:48 AM

## 2019-06-11 NOTE — Lactation Note (Signed)
This note was copied from a baby's chart. Lactation Consultation Note  Patient Name: Paula Gilbert [id number] Date: 06/11/2019 Reason for consult: Initial assessment;Early term 37-38.6wks P4, 14 hour ETI infant, SGA less than 6 lbs. Per mom, infant is latching well, breastfeeding 15-20 minutes most feeding. She breastfeed her other children for 3 months. Mom request donor breast milk to supplement infant RN is aware of mom's request order filed.  Mom doesn't have breast pump at home nor is she on the [facility] program. LC did not observe latch at this time, mom had breastfed infant for 15 minutes at 0430 am 1 hour prior to [facility] entering the room. Mom plans to start using the DEBP and will pump  every 3 hours for 15 minutes on initial setting and give infant back any EBM along with donor breast milk according to infant's age/ hours of life. Mom was given guide sheet for supplementing when breastfeeding using EBM and / or donor breast milk . Mom understands to breastfeed infant according to hunger cues, 8 to 12 times within 24 hours and not exceed 3 hours without breastfeeding infant. Mom will continue to do STS with infant. Mom will ask RN or [place] if she needs assistance with latching infant at breast.  Mom shown how to use DEBP & how to disassemble, clean, & reassemble parts. Reviewed Baby & Me book's Breastfeeding Basics.  Mom made aware of O/P services, breastfeeding support groups, community resources, and our phone # for post-discharge questions.  Maternal Data Formula Feeding for Exclusion: No Has patient been taught Hand Expression?: Yes Does the patient have breastfeeding experience prior to this delivery?: Yes  Feeding Feeding Type: Breast Fed  LATCH Score                   Interventions Interventions: Breast feeding basics reviewed;Skin to skin;Hand express;Expressed milk;DEBP  Lactation Tools Discussed/Used WIC Program: No Pump Review: Setup, frequency, and  cleaning;Milk Storage Initiated by:: Vicente Serene, IBCLC Date initiated:: 06/11/19   Consult Status Consult Status: Follow-up Date: 06/11/19 Follow-up type: In-patient    Vicente Serene 06/11/2019, 5:40 AM

## 2019-06-12 MED ORDER — IBUPROFEN 600 MG PO TABS: 600.0000 mg | ORAL_TABLET | Freq: Three times a day (TID) | ORAL | 0 refills | Status: DC | PRN

## 2019-06-12 NOTE — Discharge Instructions (Signed)
Postpartum Care After Vaginal Delivery °This sheet gives you information about how to care for yourself from the time you deliver your baby to up to 6-12 weeks after delivery (postpartum period). Your health care provider may also give you more specific instructions. If you have problems or questions, contact your health care provider. °Follow these instructions at home: °Vaginal bleeding °· It is normal to have vaginal bleeding (lochia) after delivery. Wear a sanitary pad for vaginal bleeding and discharge. °? During the first week after delivery, the amount and appearance of lochia is often similar to a menstrual period. °? Over the next few weeks, it will gradually decrease to a dry, yellow-brown discharge. °? For most women, lochia stops completely by 4-6 weeks after delivery. Vaginal bleeding can vary from woman to woman. °· Change your sanitary pads frequently. Watch for any changes in your flow, such as: °? A sudden increase in volume. °? A change in color. °? Large blood clots. °· If you pass a blood clot from your vagina, save it and call your health care provider to discuss. Do not flush blood clots down the toilet before talking with your health care provider. °· Do not use tampons or douches until your health care provider says this is safe. °· If you are not breastfeeding, your period should return 6-8 weeks after delivery. If you are feeding your child breast milk only (exclusive breastfeeding), your period may not return until you stop breastfeeding. °Perineal care °· Keep the area between the vagina and the anus (perineum) clean and dry as told by your health care provider. Use medicated pads and pain-relieving sprays and creams as directed. °· If you had a cut in the perineum (episiotomy) or a tear in the vagina, check the area for signs of infection until you are healed. Check for: °? More redness, swelling, or pain. °? Fluid or blood coming from the cut or tear. °? Warmth. °? Pus or a bad  smell. °· You may be given a squirt bottle to use instead of wiping to clean the perineum area after you go to the bathroom. As you start healing, you may use the squirt bottle before wiping yourself. Make sure to wipe gently. °· To relieve pain caused by an episiotomy, a tear in the vagina, or swollen veins in the anus (hemorrhoids), try taking a warm sitz bath 2-3 times a day. A sitz bath is a warm water bath that is taken while you are sitting down. The water should only come up to your hips and should cover your buttocks. °Breast care °· Within the first few days after delivery, your breasts may feel heavy, full, and uncomfortable (breast engorgement). Milk may also leak from your breasts. Your health care provider can suggest ways to help relieve the discomfort. Breast engorgement should go away within a few days. °· If you are breastfeeding: °? Wear a bra that supports your breasts and fits you well. °? Keep your nipples clean and dry. Apply creams and ointments as told by your health care provider. °? You may need to use breast pads to absorb milk that leaks from your breasts. °? You may have uterine contractions every time you breastfeed for up to several weeks after delivery. Uterine contractions help your uterus return to its normal size. °? If you have any problems with breastfeeding, work with your health care provider or lactation consultant. °· If you are not breastfeeding: °? Avoid touching your breasts a lot. Doing this can make   your breasts produce more milk. °? Wear a good-fitting bra and use cold packs to help with swelling. °? Do not squeeze out (express) milk. This causes you to make more milk. °Intimacy and sexuality °· Ask your health care provider when you can engage in sexual activity. This may depend on: °? Your risk of infection. °? How fast you are healing. °? Your comfort and desire to engage in sexual activity. °· You are able to get pregnant after delivery, even if you have not had  your period. If desired, talk with your health care provider about methods of birth control (contraception). °Medicines °· Take over-the-counter and prescription medicines only as told by your health care provider. °· If you were prescribed an antibiotic medicine, take it as told by your health care provider. Do not stop taking the antibiotic even if you start to feel better. °Activity °· Gradually return to your normal activities as told by your health care provider. Ask your health care provider what activities are safe for you. °· Rest as much as possible. Try to rest or take a nap while your baby is sleeping. °Eating and drinking ° °· Drink enough fluid to keep your urine pale yellow. °· Eat high-fiber foods every day. These may help prevent or relieve constipation. High-fiber foods include: °? Whole grain cereals and breads. °? Brown rice. °? Beans. °? Fresh fruits and vegetables. °· Do not try to lose weight quickly by cutting back on calories. °· Take your prenatal vitamins until your postpartum checkup or until your health care provider tells you it is okay to stop. °Lifestyle °· Do not use any products that contain nicotine or tobacco, such as cigarettes and e-cigarettes. If you need help quitting, ask your health care provider. °· Do not drink alcohol, especially if you are breastfeeding. °General instructions °· Keep all follow-up visits for you and your baby as told by your health care provider. Most women visit their health care provider for a postpartum checkup within the first 3-6 weeks after delivery. °Contact a health care provider if: °· You feel unable to cope with the changes that your child brings to your life, and these feelings do not go away. °· You feel unusually sad or worried. °· Your breasts become red, painful, or hard. °· You have a fever. °· You have trouble holding urine or keeping urine from leaking. °· You have little or no interest in activities you used to enjoy. °· You have not  breastfed at all and you have not had a menstrual period for 12 weeks after delivery. °· You have stopped breastfeeding and you have not had a menstrual period for 12 weeks after you stopped breastfeeding. °· You have questions about caring for yourself or your baby. °· You pass a blood clot from your vagina. °Get help right away if: °· You have chest pain. °· You have difficulty breathing. °· You have sudden, severe leg pain. °· You have severe pain or cramping in your lower abdomen. °· You bleed from your vagina so much that you fill more than one sanitary pad in one hour. Bleeding should not be heavier than your heaviest period. °· You develop a severe headache. °· You faint. °· You have blurred vision or spots in your vision. °· You have bad-smelling vaginal discharge. °· You have thoughts about hurting yourself or your baby. °If you ever feel like you may hurt yourself or others, or have thoughts about taking your own life, get help   right away. You can go to the nearest emergency department or call:  Your local emergency services (911 in the [place]  A suicide crisis helpline, such as the [place] at 918-546-7038. This is open 24 hours a day. Summary  The period of time right after you deliver your newborn up to 6-12 weeks after delivery is called the postpartum period.  Gradually return to your normal activities as told by your health care provider.  Keep all follow-up visits for you and your baby as told by your health care provider. This information is not intended to replace advice given to you by your health care provider. Make sure you discuss any questions you have with your health care provider. Document Revised: 04/24/2017 Document Reviewed: 02/02/2017 Elsevier Patient Education  [place].   Ovarian Torsion  In women, the ovaries are small organs in the lower abdomen that produce eggs. Ovarian torsion happens when an ovary becomes twisted and  cuts off its own blood supply. In many cases, both the ovary and the tube that connects the ovary to the uterus (fallopian tube) on the affected side become twisted (adnexal torsion). This causes the ovary to swell. If left untreated, the ovary may become infected. This condition can be very painful. Ovarian torsion can happen at any age. This condition is a medical emergency that must be treated quickly with surgery. What are the causes? This condition may be caused by:  Having an ovary that is larger than normal (enlarged). This is a common cause.  Pregnancy. Ovarian torsion may also be a complication of pregnancy. In some cases, the cause of this condition is not known. What increases the risk? You are more likely to develop this condition if you:  Have ovaries that are enlarged because of ovarian cysts.  Take fertility medicine to become pregnant.  Are pregnant.  Have a history of ovarian torsion. What are the signs or symptoms? The main symptom of this condition is pain in the pelvic area, usually on one side of the body. The pain may be severe and may come and go suddenly. Other symptoms may include:  Pelvic pain that spreads to the sides, lower back, or thighs.  Nausea and vomiting.  Fever. How is this diagnosed? This condition is diagnosed based on your medical history and a physical exam.  Your health care provider may perform a pelvic exam and check your lower abdomen for an enlarged ovary. You may also have other tests, including:  A pregnancy test.  An ultrasound to look at your ovary and the blood flow to your ovaries.  CT scan.  MRI.  Diagnostic laparoscopy. This is a procedure in which a thin tube with a light and camera on the end (laparoscope) is inserted through a small incision in your abdomen to look at your ovary. How is this treated? This condition is treated with surgery to untwist the ovary (laparoscopic ovarian torsion surgery).  If treated early,  ovarian function can usually be restored.  If the ovary cannot be untwisted, the ovary will have to be surgically removed (oophorectomy). The decision to remove your ovary will be based on your age, your desire to have children, and the condition of your ovary at the time of surgery. Follow these instructions at home:  Take over-the-counter and prescription medicines only as told by your health care provider.  Do not drive or use heavy machinery while taking prescription pain medicine.  Get regular pelvic exams as often as  told by your health care provider.  Return to your normal activities as told by your health care provider. Ask your health care provider what activities are safe for you.  Keep all follow-up visits as told by your health care provider. This is important. Contact a health care provider if you:  Have any symptoms of ovarian torsion.  Have pelvic pain that does not go away.  Are pregnant and have pelvic pain. Get help right away if you:  Have sudden, severe pelvic pain.  Have nausea and vomiting that does not go away.  Suddenly have a fever. These symptoms may represent a serious problem that is an emergency. Do not wait to see if the symptoms will go away. Get medical help right away. Call your local emergency services (911 in the [place] Do not drive yourself to the hospital. Summary  Ovarian torsion is a serious condition that requires immediate medical treatment.  Talk with your health care provider about your risk for this condition.  If you are high risk for an ovarian torsion and experience symptoms, seek immediate medical care. This information is not intended to replace advice given to you by your health care provider. Make sure you discuss any questions you have with your health care provider. Document Revised: 09/24/2017 Document Reviewed: 09/24/2017 Elsevier Patient Education  2020 [employer].

## 2019-06-12 NOTE — Lactation Note (Signed)
This note was copied from a baby's chart. Lactation Consultation Note  Patient Name: Paula Gilbert [id number] Date: 06/12/2019 Reason for consult: Follow-up assessment  P4 mother whose infant is now 69 hours old.  This is an ETI at 37+0 weeks weighing < 6 lbs.  Mother has breast feeding experience with her other three children.  Mother has been breast feeding and supplementing with donor milk.  She had no questions/concerns with breast feeding and stated that baby is feeding well.  Reminded her to put baby to breast first prior to using any supplementation.  Mother verbalized understanding.  Suggested mother increase volume supplementation to 30 or more mls today.  She has been bottle feeding well also per mother.  Engorgement prevention/treatment discussed.  Mother has experienced engorgement in the past.  She has a manual pump at bedside for home use.  She does not have private insurance or [place].  Mother plans to call the [facility] office tomorrow to determine eligibility.  No support person present at this time. RN updated.   Maternal Data    Feeding Feeding Type: Breast Fed  LATCH Score Latch: Grasps breast easily, tongue down, lips flanged, rhythmical sucking.  Audible Swallowing: A few with stimulation  Type of Nipple: Everted at rest and after stimulation  Comfort (Breast/Nipple): Soft / non-tender  Hold (Positioning): No assistance needed to correctly position infant at breast.  LATCH Score: 9  Interventions Interventions: Skin to skin  Lactation Tools Discussed/Used     Consult Status Consult Status: Complete Date: 06/12/19 Follow-up type: In-patient    Little Ishikawa 06/12/2019, 8:38 AM

## 2019-06-17 ENCOUNTER — Inpatient Hospital Stay (HOSPITAL_COMMUNITY)
Admission: EM | Admit: 2019-06-17 | Discharge: 2019-06-19 | DRG: 776 | Disposition: A | Discharge status: Home / Self Care | Payer: Medicaid Other | Attending: Obstetrics & Gynecology | Admitting: Obstetrics & Gynecology

## 2019-06-17 ENCOUNTER — Other Ambulatory Visit: Payer: Self-pay

## 2019-06-17 DIAGNOSIS — O99893 Other specified diseases and conditions complicating puerperium: Secondary | ICD-10-CM | POA: Diagnosis not present

## 2019-06-17 DIAGNOSIS — O9089 Other complications of the puerperium, not elsewhere classified: Secondary | ICD-10-CM | POA: Diagnosis present

## 2019-06-17 DIAGNOSIS — Z87891 Personal history of nicotine dependence: Secondary | ICD-10-CM

## 2019-06-17 DIAGNOSIS — O1495 Unspecified pre-eclampsia, complicating the puerperium: Principal | ICD-10-CM | POA: Diagnosis present

## 2019-06-17 DIAGNOSIS — R519 Headache, unspecified: Secondary | ICD-10-CM

## 2019-06-17 DIAGNOSIS — N814 Uterovaginal prolapse, unspecified: Secondary | ICD-10-CM | POA: Diagnosis not present

## 2019-06-17 DIAGNOSIS — I10 Essential (primary) hypertension: Secondary | ICD-10-CM

## 2019-06-17 HISTORY — DX: Unspecified pre-eclampsia, complicating the puerperium: O14.95

## 2019-06-17 LAB — CBC WITH DIFFERENTIAL/PLATELET
Abs Immature Granulocytes: 0.06 10*3/uL (ref 0.00–0.07)
Basophils Absolute: 0 10*3/uL (ref 0.0–0.1)
Basophils Relative: 0 %
Eosinophils Absolute: 0.1 10*3/uL (ref 0.0–0.5)
Eosinophils Relative: 1 %
HCT: 38.3 % (ref 36.0–46.0)
Hemoglobin: 12.3 g/dL (ref 12.0–15.0)
Immature Granulocytes: 1 %
Lymphocytes Relative: 24 %
Lymphs Abs: 2.5 10*3/uL (ref 0.7–4.0)
MCH: 30.3 pg (ref 26.0–34.0)
MCHC: 32.1 g/dL (ref 30.0–36.0)
MCV: 94.3 fL (ref 80.0–100.0)
Monocytes Absolute: 1 10*3/uL (ref 0.1–1.0)
Monocytes Relative: 10 %
Neutro Abs: 6.5 10*3/uL (ref 1.7–7.7)
Neutrophils Relative %: 64 %
Platelets: 341 10*3/uL (ref 150–400)
RBC: 4.06 MIL/uL (ref 3.87–5.11)
RDW: 13.7 % (ref 11.5–15.5)
WBC: 10.2 10*3/uL (ref 4.0–10.5)
nRBC: 0 % (ref 0.0–0.2)

## 2019-06-17 LAB — URINALYSIS, ROUTINE W REFLEX MICROSCOPIC
Bilirubin Urine: NEGATIVE
Glucose, UA: NEGATIVE mg/dL
Ketones, ur: NEGATIVE mg/dL
Nitrite: NEGATIVE
Protein, ur: NEGATIVE mg/dL
Specific Gravity, Urine: 1.01 (ref 1.005–1.030)
pH: 7 (ref 5.0–8.0)

## 2019-06-17 LAB — COMPREHENSIVE METABOLIC PANEL
ALT: 46 U/L — ABNORMAL HIGH (ref 0–44)
AST: 24 U/L (ref 15–41)
Albumin: 3 g/dL — ABNORMAL LOW (ref 3.5–5.0)
Alkaline Phosphatase: 118 U/L (ref 38–126)
Anion gap: 10 (ref 5–15)
BUN: 11 mg/dL (ref 6–20)
CO2: 21 mmol/L — ABNORMAL LOW (ref 22–32)
Calcium: 8.5 mg/dL — ABNORMAL LOW (ref 8.9–10.3)
Chloride: 108 mmol/L (ref 98–111)
Creatinine, Ser: 0.54 mg/dL (ref 0.44–1.00)
GFR calc Af Amer: >60 mL/min (ref >60)
GFR calc non Af Amer: >60 mL/min (ref >60)
Glucose, Bld: 92 mg/dL (ref 70–99)
Potassium: 3.4 mmol/L — ABNORMAL LOW (ref 3.5–5.1)
Sodium: 139 mmol/L (ref 135–145)
Total Bilirubin: 0.7 mg/dL (ref 0.3–1.2)
Total Protein: 6.1 g/dL — ABNORMAL LOW (ref 6.5–8.1)

## 2019-06-17 MED ORDER — MAGNESIUM SULFATE 4 GM/100ML IV SOLN
4.0000 g | Freq: Once | INTRAVENOUS | Status: AC
  Administered 2019-06-17: 4 g via INTRAVENOUS
  Filled 2019-06-17: qty 100

## 2019-06-17 MED ORDER — MAGNESIUM SULFATE BOLUS VIA INFUSION
4.0000 g | Freq: Once | INTRAVENOUS | Status: DC
  Filled 2019-06-17: qty 1000

## 2019-06-17 MED ORDER — ZOLPIDEM TARTRATE 5 MG PO TABS: 5.0000 mg | ORAL_TABLET | Freq: Every evening | ORAL | Status: DC | PRN

## 2019-06-17 MED ORDER — ACETAMINOPHEN 325 MG PO TABS
650.0000 mg | ORAL_TABLET | Freq: Once | ORAL | Status: AC
  Administered 2019-06-17: 650 mg via ORAL
  Filled 2019-06-17: qty 2

## 2019-06-17 MED ORDER — MAGNESIUM SULFATE 40 GM/1000ML IV SOLN
2.0000 g/h | INTRAVENOUS | Status: DC
  Administered 2019-06-17 – 2019-06-18 (×2): 2 g/h via INTRAVENOUS
  Filled 2019-06-17 (×2): qty 1000

## 2019-06-17 MED ORDER — MAGNESIUM SULFATE 2 GM/50ML IV SOLN
2.0000 g | Freq: Once | INTRAVENOUS | Status: AC
  Administered 2019-06-17: 2 g via INTRAVENOUS
  Filled 2019-06-17: qty 50

## 2019-06-17 MED ORDER — LACTATED RINGERS IV BOLUS
1000.0000 mL | Freq: Once | INTRAVENOUS | Status: AC
  Administered 2019-06-17: 1000 mL via INTRAVENOUS

## 2019-06-17 MED ORDER — PRENATAL MULTIVITAMIN CH
1.0000 | ORAL_TABLET | Freq: Every day | ORAL | Status: DC
  Administered 2019-06-18 – 2019-06-19 (×2): 1 via ORAL
  Filled 2019-06-17 (×2): qty 1

## 2019-06-17 MED ORDER — DOCUSATE SODIUM 100 MG PO CAPS
100.0000 mg | ORAL_CAPSULE | Freq: Every day | ORAL | Status: DC
  Administered 2019-06-17 – 2019-06-19 (×2): 100 mg via ORAL
  Filled 2019-06-17 (×2): qty 1

## 2019-06-17 MED ORDER — ACETAMINOPHEN 325 MG PO TABS
650.0000 mg | ORAL_TABLET | ORAL | Status: DC | PRN
  Administered 2019-06-18 – 2019-06-19 (×2): 650 mg via ORAL
  Filled 2019-06-17 (×2): qty 2

## 2019-06-17 MED ORDER — MAGNESIUM SULFATE 50 % IJ SOLN: 4.0000 g | Freq: Once | INTRAMUSCULAR | Status: DC

## 2019-06-17 MED ORDER — CALCIUM CARBONATE ANTACID 500 MG PO CHEW: 2.0000 | CHEWABLE_TABLET | ORAL | Status: DC | PRN

## 2019-06-17 NOTE — ED Notes (Signed)
[facility] contacted for transport to [facility]. Paperwork printed and at nursing station.

## 2019-06-17 NOTE — ED Notes (Signed)
Patient made aware that urine sample is needed.  
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
## 2019-06-17 NOTE — ED Triage Notes (Signed)
Arrived POV from home with headache and Hypertensive 167/104 at home. Patient reports she delivered Friday 02/05/2020and reports feeling "weird" all week. Patient  Also reports dizziness, facial swelling, and swelling of right leg and foot.

## 2019-06-18 ENCOUNTER — Encounter (HOSPITAL_COMMUNITY): Payer: Self-pay | Admitting: Obstetrics & Gynecology

## 2019-06-18 LAB — TYPE AND SCREEN
ABO/RH(D): B POS
ABO/RH(D): B POS
Antibody Screen: NEGATIVE
Antibody Screen: NEGATIVE

## 2019-06-18 LAB — ABO/RH: ABO/RH(D): B POS

## 2019-06-18 MED ORDER — BUTALBITAL-APAP-CAFFEINE 50-325-40 MG PO TABS
2.0000 | ORAL_TABLET | Freq: Four times a day (QID) | ORAL | Status: DC | PRN
  Administered 2019-06-18 – 2019-06-19 (×2): 2 via ORAL
  Filled 2019-06-18 (×2): qty 2

## 2019-06-18 MED ORDER — LACTATED RINGERS IV SOLN: INTRAVENOUS | Status: DC

## 2019-06-18 MED ORDER — PROCHLORPERAZINE EDISYLATE 10 MG/2ML IJ SOLN
10.0000 mg | Freq: Once | INTRAMUSCULAR | Status: AC
  Administered 2019-06-18: 10 mg via INTRAVENOUS
  Filled 2019-06-18: qty 2

## 2019-06-18 MED ORDER — NIFEDIPINE ER OSMOTIC RELEASE 30 MG PO TB24
30.0000 mg | ORAL_TABLET | Freq: Every day | ORAL | Status: DC
  Administered 2019-06-18 – 2019-06-19 (×2): 30 mg via ORAL
  Filled 2019-06-18 (×2): qty 1

## 2019-06-18 MED ORDER — DIPHENHYDRAMINE HCL 50 MG/ML IJ SOLN
25.0000 mg | Freq: Once | INTRAMUSCULAR | Status: AC
  Administered 2019-06-18: 25 mg via INTRAVENOUS
  Filled 2019-06-18: qty 1

## 2019-06-18 MED ORDER — OXYCODONE-ACETAMINOPHEN 5-325 MG PO TABS: 1.0000 | ORAL_TABLET | ORAL | Status: DC | PRN

## 2019-06-18 MED ORDER — SODIUM CHLORIDE 0.9 % IV SOLN
10.0000 mg | Freq: Once | INTRAVENOUS | Status: AC
  Administered 2019-06-18: 10 mg via INTRAVENOUS
  Filled 2019-06-18: qty 1
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
## 2019-06-18 NOTE — ED Notes (Signed)
[facility] is here to transport, OB speciality updated on status made aware her covid test was negative on 06/08/19

## 2019-06-19 ENCOUNTER — Inpatient Hospital Stay (HOSPITAL_COMMUNITY)
Admission: AD | Admit: 2019-06-19 | Discharge: 2019-06-19 | Disposition: A | Discharge status: Home / Self Care | Payer: Medicaid Other | Attending: Obstetrics & Gynecology | Admitting: Obstetrics & Gynecology

## 2019-06-19 ENCOUNTER — Encounter (HOSPITAL_COMMUNITY): Payer: Self-pay | Admitting: Obstetrics & Gynecology

## 2019-06-19 ENCOUNTER — Other Ambulatory Visit: Payer: Self-pay

## 2019-06-19 DIAGNOSIS — O1495 Unspecified pre-eclampsia, complicating the puerperium: Principal | ICD-10-CM

## 2019-06-19 DIAGNOSIS — O99893 Other specified diseases and conditions complicating puerperium: Secondary | ICD-10-CM | POA: Diagnosis not present

## 2019-06-19 DIAGNOSIS — N814 Uterovaginal prolapse, unspecified: Secondary | ICD-10-CM | POA: Insufficient documentation

## 2019-06-19 DIAGNOSIS — O9089 Other complications of the puerperium, not elsewhere classified: Secondary | ICD-10-CM | POA: Insufficient documentation

## 2019-06-19 DIAGNOSIS — Z87891 Personal history of nicotine dependence: Secondary | ICD-10-CM | POA: Insufficient documentation

## 2019-06-19 MED ORDER — NIFEDIPINE ER 30 MG PO TB24: 30.0000 mg | ORAL_TABLET | Freq: Every day | ORAL | 0 refills | Status: DC

## 2019-06-19 NOTE — Discharge Summary (Signed)
OB Discharge Summary     Patient Name: Paula Gilbert DOB: 1990-10-27 MRN: [id number]  Date of admission: 06/17/2019 Delivering MD: This patient has no babies on file.  Date of discharge: 06/19/2019  Admitting diagnosis: Nonintractable headache, unspecified chronicity pattern, unspecified headache type [R51.9] Pre-eclampsia in postpartum period [O14.95] Hypertension, unspecified type [I10] Intrauterine pregnancy: Unknown     Secondary diagnosis:  Active Problems:   Pre-eclampsia in postpartum period    Discharge diagnosis: postpartum pre-eclampsia                                                                                                Hospital course:  Patient admitted 8 days following vaginal delivery secondary to severe preeclampsia with symptoms of headaches and elevated BP. She received magnesium sulfate for seizure prophylaxis and started on procardia. Following discontinuation of her magnesium, BP remained stable and patient was asymptomatic. Patient found stable for discharge. Precautions reviewed. Patient to follow up in the office for BP check  Physical exam  Vitals:   06/18/19 2324 06/19/19 0000 06/19/19 0331 06/19/19 0350  BP: 126/82 123/69 110/62 120/75  Pulse: 95 91 93 86  Resp: 18 16 17 18   Temp: 97.8 F (36.6 C)  97.9 F (36.6 C) 98.3 F (36.8 C)  TempSrc: Oral  Oral Oral  SpO2: 100% 99% 98% 99%  Weight:      Height:       General: alert, cooperative and no distress Lochia: appropriate Uterine Fundus: firm DVT Evaluation: No evidence of DVT seen on physical exam. Labs: Lab Results  Component Value Date   WBC 10.2 06/17/2019   HGB 12.3 06/17/2019   HCT 38.3 06/17/2019   MCV 94.3 06/17/2019   PLT 341 06/17/2019   CMP Latest Ref Rng & Units 06/17/2019  Glucose 70 - 99 mg/dL 92  BUN 6 - 20 mg/dL 11  Creatinine 0.44 - 1.00 mg/dL 0.54  Sodium 135 - 145 mmol/L 139  Potassium 3.5 - 5.1 mmol/L 3.4(L)  Chloride 98 - 111 mmol/L 108  CO2 22 - 32  mmol/L 21(L)  Calcium 8.9 - 10.3 mg/dL 8.5(L)  Total Protein 6.5 - 8.1 g/dL 6.1(L)  Total Bilirubin 0.3 - 1.2 mg/dL 0.7  Alkaline Phos 38 - 126 U/L 118  AST 15 - 41 U/L 24  ALT 0 - 44 U/L 46(H)    Discharge instruction: per After Visit Summary and "Baby and Me Booklet".  After visit meds:  Allergies as of 06/19/2019   No Known Allergies     Medication List    TAKE these medications   acetaminophen 500 MG tablet Commonly known as: TYLENOL Take 500 mg by mouth every 6 (six) hours as needed for mild pain or headache.   ibuprofen 600 MG tablet Commonly known as: ADVIL Take 1 tablet (600 mg total) by mouth every 8 (eight) hours as needed for mild pain.   multivitamin-prenatal 27-0.8 MG Tabs tablet Take 1 tablet by mouth daily at 12 noon.   NIFEdipine 30 MG 24 hr tablet Commonly known as: ADALAT CC Take 1 tablet (30 mg total) by mouth daily.  Diet: routine diet  Activity: Advance as tolerated. Pelvic rest for 6 weeks.   Outpatient follow up: 1 week for BP check Follow up Appt: Future Appointments  Date Time Provider [place]  07/08/2019 10:15 AM Woodroe Mode, MD [place]      06/19/2019 Mora Bellman, MD
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
## 2019-06-19 NOTE — Discharge Instructions (Signed)
Postpartum Hypertension Postpartum hypertension is high blood pressure that remains higher than normal after childbirth. You may not realize that you have postpartum hypertension if your blood pressure is not being checked regularly. In most cases, postpartum hypertension will go away on its own, usually within a week of delivery. However, for some women, medical treatment is required to prevent serious complications, such as seizures or stroke. What are the causes? This condition may be caused by one or more of the following:  Hypertension that existed before pregnancy (chronic hypertension).  Hypertension that comes on as a result of pregnancy (gestational hypertension).  Hypertensive disorders during pregnancy (preeclampsia) or seizures in women who have high blood pressure during pregnancy (eclampsia).  A condition in which the liver, platelets, and red blood cells are damaged during pregnancy (HELLP syndrome).  A condition in which the thyroid produces too much hormones (hyperthyroidism).  Other rare problems of the nerves (neurological disorders) or blood disorders. In some cases, the cause may not be known. What increases the risk? The following factors may make you more likely to develop this condition:  Chronic hypertension. In some cases, this may not have been diagnosed before pregnancy.  Obesity.  Type 2 diabetes.  Kidney disease.  History of preeclampsia or eclampsia.  Other medical conditions that change the level of hormones in the body (hormonal imbalance). What are the signs or symptoms? As with all types of hypertension, postpartum hypertension may not have any symptoms. Depending on how high your blood pressure is, you may experience:  Headaches. These may be mild, moderate, or severe. They may also be steady, Paula Gilbert, or sudden in onset (thunderclap headache).  Changes in your ability to see (visual changes).  Dizziness.  Shortness of breath.  Swelling  of your hands, feet, lower legs, or face. In some cases, you may have swelling in more than one of these locations.  Heart palpitations or a racing heartbeat.  Difficulty breathing while lying down.  Decrease in the amount of urine that you pass. Other rare signs and symptoms may include:  Sweating more than usual. This lasts longer than a few days after delivery.  Chest pain.  Sudden dizziness when you get up from sitting or lying down.  Seizures.  Nausea or vomiting.  Abdominal pain. How is this diagnosed? This condition may be diagnosed based on the results of a physical exam, blood pressure measurements, and blood and urine tests. You may also have other tests, such as a CT scan or an MRI, to check for other problems of postpartum hypertension. How is this treated? If blood pressure is high enough to require treatment, your options may include:  Medicines to reduce blood pressure (antihypertensives). Tell your health care provider if you are breastfeeding or if you plan to breastfeed. There are many antihypertensive medicines that are safe to take while breastfeeding.  Stopping medicines that may be causing hypertension.  Treating medical conditions that are causing hypertension.  Treating the complications of hypertension, such as seizures, stroke, or kidney problems. Your health care provider will also continue to monitor your blood pressure closely until it is within a safe range for you. Follow these instructions at home:  Take over-the-counter and prescription medicines only as told by your health care provider.  Return to your normal activities as told by your health care provider. Ask your health care provider what activities are safe for you.  Do not use any products that contain nicotine or tobacco, such as cigarettes and e-cigarettes. If   you need help quitting, ask your health care provider.  Keep all follow-up visits as told by your health care provider. This  is important. Contact a health care provider if:  Your symptoms get worse.  You have new symptoms, such as: ? A headache that does not get better. ? Dizziness. ? Visual changes. Get help right away if:  You suddenly develop swelling in your hands, ankles, or face.  You have sudden, rapid weight gain.  You develop difficulty breathing, chest pain, racing heartbeat, or heart palpitations.  You develop severe pain in your abdomen.  You have any symptoms of a stroke. "BE FAST" is an easy way to remember the main warning signs of a stroke: ? B - Balance. Signs are dizziness, sudden trouble walking, or loss of balance. ? E - Eyes. Signs are trouble seeing or a sudden change in vision. ? F - Face. Signs are sudden weakness or numbness of the face, or the face or eyelid drooping on one side. ? A - Arms. Signs are weakness or numbness in an arm. This happens suddenly and usually on one side of the body. ? S - Speech. Signs are sudden trouble speaking, slurred speech, or trouble understanding what people say. ? T - Time. Time to call emergency services. Write down what time symptoms started.  You have other signs of a stroke, such as: ? A sudden, severe headache with no known cause. ? Nausea or vomiting. ? Seizure. These symptoms may represent a serious problem that is an emergency. Do not wait to see if the symptoms will go away. Get medical help right away. Call your local emergency services (911 in the [place] Do not drive yourself to the hospital. Summary  Postpartum hypertension is high blood pressure that remains higher than normal after childbirth.  In most cases, postpartum hypertension will go away on its own, usually within a week of delivery.  For some women, medical treatment is required to prevent serious complications, such as seizures or stroke. This information is not intended to replace advice given to you by your health care provider. Make sure you discuss any questions  you have with your health care provider. Document Revised: 05/28/2018 Document Reviewed: 02/09/2017 Elsevier Patient Education  2020 [employer]  

## 2019-06-19 NOTE — Discharge Instructions (Signed)
Pelvic Organ Prolapse Pelvic organ prolapse is the stretching, bulging, or dropping of pelvic organs into an abnormal position. It happens when the muscles and tissues that surround and support pelvic structures become weak or stretched. Pelvic organ prolapse can involve the:  Vagina (vaginal prolapse).  Uterus (uterine prolapse).  Bladder (cystocele).  Rectum (rectocele).  Intestines (enterocele). When organs other than the vagina are involved, they often bulge into the vagina or protrude from the vagina, depending on how severe the prolapse is. What are the causes? This condition may be caused by:  Pregnancy, labor, and childbirth.  Past pelvic surgery.  Decreased production of the hormone estrogen associated with menopause.  Consistently lifting more than 50 lb (23 kg).  Obesity.  Long-term inability to pass stool (chronic constipation).  A cough that lasts a long time (chronic).  Buildup of fluid in the abdomen due to certain diseases and other conditions. What are the signs or symptoms? Symptoms of this condition include:  Passing a little urine (loss of bladder control) when you cough, sneeze, strain, and exercise (stress incontinence). This may be worse immediately after childbirth. It may gradually improve over time.  Feeling pressure in your pelvis or vagina. This pressure may increase when you cough or when you are passing stool.  A bulge that protrudes from the opening of your vagina.  Difficulty passing urine or stool.  Pain in your lower back.  Pain, discomfort, or disinterest in sex.  Repeated bladder infections (urinary tract infections).  Difficulty inserting a tampon. In some people, this condition causes no symptoms. How is this diagnosed? This condition may be diagnosed based on a vaginal and rectal exam. During the exam, you may be asked to cough and strain while you are lying down, sitting, and standing up. Your health care provider will  determine if other tests are required, such as bladder function tests. How is this treated? Treatment for this condition may depend on your symptoms. Treatment may include:  Lifestyle changes, such as changes to your diet.  Emptying your bladder at scheduled times (bladder training therapy). This can help reduce or avoid urinary incontinence.  Estrogen. Estrogen may help mild prolapse by increasing the strength and tone of pelvic floor muscles.  Kegel exercises. These may help mild cases of prolapse by strengthening and tightening the muscles of the pelvic floor.  A soft, flexible device that helps support the vaginal walls and keep pelvic organs in place (pessary). This is inserted into your vagina by your health care provider.  Surgery. This is often the only form of treatment for severe prolapse. Follow these instructions at home:  Avoid drinking beverages that contain caffeine or alcohol.  Increase your intake of high-fiber foods. This can help decrease constipation and straining during bowel movements.  Lose weight if recommended by your health care provider.  Wear a sanitary pad or adult diapers if you have urinary incontinence.  Avoid heavy lifting and straining with exercise and work. Do not hold your breath when you perform mild to moderate lifting and exercise activities. Limit your activities as directed by your health care provider.  Do Kegel exercises as directed by your health care provider. To do this: ? Squeeze your pelvic floor muscles tight. You should feel a tight lift in your rectal area and a tightness in your vaginal area. Keep your stomach, buttocks, and legs relaxed. ? Hold the muscles tight for up to 10 seconds. ? Relax your muscles. ? Repeat this exercise 50 times a day,   or as many times as told by your health care provider. Continue to do this exercise for at least 4-6 weeks, or for as long as told by your health care provider.  Take over-the-counter and  prescription medicines only as told by your health care provider.  If you have a pessary, take care of it as told by your health care provider.  Keep all follow-up visits as told by your health care provider. This is important. Contact a health care provider if you:  Have symptoms that interfere with your daily activities or sex life.  Need medicine to help with the discomfort.  Notice bleeding from your vagina that is not related to your period.  Have a fever.  Have pain or bleeding when you urinate.  Have bleeding when you pass stool.  Pass urine when you have sex.  Have chronic constipation.  Have a pessary that falls out.  Have bad smelling vaginal discharge.  Have an unusual, low pain in your abdomen. Summary  Pelvic organ prolapse is the stretching, bulging, or dropping of pelvic organs into an abnormal position. It happens when the muscles and tissues that surround and support pelvic structures become weak or stretched.  When organs other than the vagina are involved, they often bulge into the vagina or protrude from the vagina, depending on how severe the prolapse is.  In most cases, this condition needs to be treated only if it produces symptoms. Treatment may include lifestyle changes, estrogen, Kegel exercises, pessary insertion, or surgery.  Avoid heavy lifting and straining with exercise and work. Do not hold your breath when you perform mild to moderate lifting and exercise activities. Limit your activities as directed by your health care provider. This information is not intended to replace advice given to you by your health care provider. Make sure you discuss any questions you have with your health care provider. Document Revised: 05/13/2017 Document Reviewed: 05/13/2017 Elsevier Patient Education  2020 [employer]  

## 2019-06-19 NOTE — Progress Notes (Signed)
Flasher with Dr. Harolyn Rutherford for patient to be discharged. MD aware of pt's recurring headaches and increasing BP (not severe range). Pt discharged after discharge instructions given. All questions answered. IV discontinued. Pt sent with all belongings in stable condition.

## 2019-06-19 NOTE — MAU Note (Signed)
Pt reports to MAU stating "my uterus is falling out." pt reports she just left the hospital at 1500 for pre-eclampsia. Pt denies HA or blurry vision. Pt states "something is bulging out of her coochie." pt denies pain. Pt reports some bleeding.

## 2019-06-20 ENCOUNTER — Encounter: Payer: Self-pay | Admitting: *Deleted

## 2019-06-24 ENCOUNTER — Ambulatory Visit: Payer: Medicaid Other

## 2019-06-27 ENCOUNTER — Ambulatory Visit (INDEPENDENT_AMBULATORY_CARE_PROVIDER_SITE_OTHER): Payer: Medicaid Other | Admitting: *Deleted

## 2019-06-27 ENCOUNTER — Other Ambulatory Visit (HOSPITAL_COMMUNITY)
Admission: RE | Admit: 2019-06-27 | Discharge: 2019-06-27 | Disposition: A | Discharge status: Home / Self Care | Payer: Medicaid Other | Source: Ambulatory Visit | Attending: Family Medicine | Admitting: Family Medicine

## 2019-06-27 ENCOUNTER — Other Ambulatory Visit: Payer: Self-pay

## 2019-06-27 VITALS — BP 134/88 | HR 83

## 2019-06-27 DIAGNOSIS — R519 Headache, unspecified: Secondary | ICD-10-CM

## 2019-06-27 DIAGNOSIS — N898 Other specified noninflammatory disorders of vagina: Secondary | ICD-10-CM | POA: Insufficient documentation

## 2019-06-27 MED ORDER — CYCLOBENZAPRINE HCL 10 MG PO TABS: 10.0000 mg | ORAL_TABLET | Freq: Three times a day (TID) | ORAL | 0 refills | Status: DC | PRN

## 2019-06-27 NOTE — Progress Notes (Signed)
Here for bp check. Had delivery 06/10/19 then developed pre- eclampsia .Is taking her procardia . Also states since her uterine prolapsed and pushed back in - thinks he has another bacteria infection. States had dark brown bleeding and bad odor. Will do self swab.  Reports bp today at home 133/99. Before today 122/ 71, . C/o wakes up with headache everyday and lasts most of day- takes tylenol and ibuprofen and tries to rest.  No swelling noted today, sometimes right foot swells.   Discussed BP , history, symptoms with Dr. Nehemiah Settle. Orders to continue procardia and Flexeril RX given. Explained to patient we want her to take 1/ 2 tablet at first this evening while someone is home with her to see how it affects her because it can be sedating. Explained may take 1 tablet if needed up to TID prn. Reviewed her upcoming appointment 2/26 with Dr. Kennon Rounds for prolapse uterus. She voices understanding.  Kohner Orlick,RN

## 2019-06-27 NOTE — Progress Notes (Signed)
Chart reviewed - agree with CMA/RN documentation.  ° °

## 2019-06-29 ENCOUNTER — Telehealth: Payer: Self-pay | Admitting: Family Medicine

## 2019-06-29 LAB — CERVICOVAGINAL ANCILLARY ONLY
Bacterial Vaginitis (gardnerella): NEGATIVE
Candida Glabrata: NEGATIVE
Candida Vaginitis: NEGATIVE
Comment: NEGATIVE
Comment: NEGATIVE
Comment: NEGATIVE

## 2019-06-29 NOTE — Telephone Encounter (Signed)
Received a call from patient requesting a clarfcation of negative results from test taken 02/22.

## 2019-06-29 NOTE — Telephone Encounter (Signed)
Called patient stating I am returning her phone call. Patient states she was wanting to know her test results from Monday. Informed patient of normal results. Patient states she knows she has an infection because she is having a horrible odor even if hardly any blood is coming out. She reports this started whenever she had her uterus pushed back up. Advised she discuss this with Dr Kennon Rounds at her appt on Friday. Patient verbalized understanding.

## 2019-07-01 ENCOUNTER — Other Ambulatory Visit (HOSPITAL_COMMUNITY)
Admission: RE | Admit: 2019-07-01 | Discharge: 2019-07-01 | Disposition: A | Discharge status: Home / Self Care | Payer: Medicaid Other | Source: Ambulatory Visit | Attending: Family Medicine | Admitting: Family Medicine

## 2019-07-01 ENCOUNTER — Ambulatory Visit (INDEPENDENT_AMBULATORY_CARE_PROVIDER_SITE_OTHER): Payer: Medicaid Other | Admitting: Family Medicine

## 2019-07-01 ENCOUNTER — Encounter: Payer: Self-pay | Admitting: Family Medicine

## 2019-07-01 ENCOUNTER — Other Ambulatory Visit: Payer: Self-pay

## 2019-07-01 VITALS — BP 125/88 | HR 94 | Ht 67.0 in | Wt 141.0 lb

## 2019-07-01 DIAGNOSIS — N83202 Unspecified ovarian cyst, left side: Secondary | ICD-10-CM

## 2019-07-01 DIAGNOSIS — N949 Unspecified condition associated with female genital organs and menstrual cycle: Secondary | ICD-10-CM | POA: Diagnosis present

## 2019-07-01 DIAGNOSIS — N898 Other specified noninflammatory disorders of vagina: Secondary | ICD-10-CM

## 2019-07-01 MED ORDER — AMOXICILLIN-POT CLAVULANATE 875-125 MG PO TABS: 1.0000 | ORAL_TABLET | Freq: Two times a day (BID) | ORAL | 0 refills | Status: DC

## 2019-07-01 NOTE — Progress Notes (Signed)
   Subjective:    Patient ID: Paula Gilbert is a 29 y.o. female presenting with Follow-up  on 07/01/2019  HPI: She is about 3 wks, post SVD. Postpartum course complicated by readmission for HTN, on Procardia. Then came to [facility]U for prolapsing uterus, replaced. Since that time has developed significant odor. Has negative wet prep 3 days ago. Just wearing pads and having bleeding. Denies fever/chills, nausea/vomiting.  Also with left ovarian cyst, unclear what type.  Review of Systems  Constitutional: Negative for chills and fever.  Respiratory: Negative for shortness of breath.   Cardiovascular: Negative for chest pain.  Gastrointestinal: Negative for abdominal pain, nausea and vomiting.  Genitourinary: Negative for dysuria.  Skin: Negative for rash.      Objective:    BP 125/88   Pulse 94   Ht 5\' 7"  (1.702 m)   Wt 141 lb (64 kg)   LMP  (LMP Unknown)   Breastfeeding Yes   BMI 22.08 kg/m  Physical Exam Constitutional:      General: She is not in acute distress.    Appearance: She is well-developed.  HENT:     Head: Normocephalic and atraumatic.  Eyes:     General: No scleral icterus. Cardiovascular:     Rate and Rhythm: Normal rate.  Pulmonary:     Effort: Pulmonary effort is normal.  Abdominal:     Palpations: Abdomen is soft.  Genitourinary:    Comments: BUS normal, vagina is pink and rugated, yellow, malodorous discharge noted cervix is parous without lesion, uterus is 14 wk size and tender, left adnexal mass noted.  Musculoskeletal:     Cervical back: Neck supple.  Skin:    General: Skin is warm and dry.  Neurological:     Mental Status: She is alert and oriented to person, place, and time.         Assessment & Plan:   Problem List Items Addressed This Visit      Unprioritized   Left ovarian cyst - Primary    Would like to clarify what this is, prior to booking case--to see a partner for pp exam in one week. Will schedule GYN u/s in 2 wks.      Relevant Orders   US PELVIC COMPLETE WITH TRANSVAGINAL    Other Visit Diagnoses    Vaginal odor       suspect endometritis, 7 day of Augmentin, repeat cultures.   Relevant Medications   amoxicillin-clavulanate (AUGMENTIN) 875-125 MG tablet   Other Relevant Orders   Cervicovaginal ancillary only( [place])      Total face-to-face time with patient: 30 minutes. Over 50% of encounter was spent on counseling and coordination of care. Return in about 4 weeks (around 07/29/2019) for needs U/S in 2-3 wks to eval ovarian cyst--f/u after that for surgical consult with any MD.  Donnamae Jude 07/01/2019 3:04 PM

## 2019-07-01 NOTE — Assessment & Plan Note (Signed)
Would like to clarify what this is, prior to booking case--to see a partner for pp exam in one week. Will schedule GYN u/s in 2 wks.

## 2019-07-04 LAB — CERVICOVAGINAL ANCILLARY ONLY
Bacterial Vaginitis (gardnerella): NEGATIVE
Candida Glabrata: NEGATIVE
Candida Vaginitis: NEGATIVE
Chlamydia: NEGATIVE
Comment: NEGATIVE
Comment: NEGATIVE
Comment: NEGATIVE
Comment: NEGATIVE
Comment: NEGATIVE
Comment: NORMAL
Neisseria Gonorrhea: NEGATIVE
Trichomonas: POSITIVE — AB

## 2019-07-05 ENCOUNTER — Other Ambulatory Visit: Payer: Self-pay | Admitting: Family Medicine

## 2019-07-05 DIAGNOSIS — A599 Trichomoniasis, unspecified: Secondary | ICD-10-CM

## 2019-07-05 MED ORDER — METRONIDAZOLE 500 MG PO TABS: 1000.0000 mg | ORAL_TABLET | Freq: Two times a day (BID) | ORAL | 1 refills | Status: AC

## 2019-07-08 ENCOUNTER — Telehealth (INDEPENDENT_AMBULATORY_CARE_PROVIDER_SITE_OTHER): Payer: Medicaid Other | Admitting: Obstetrics & Gynecology

## 2019-07-08 ENCOUNTER — Other Ambulatory Visit: Payer: Self-pay

## 2019-07-08 ENCOUNTER — Ambulatory Visit: Payer: Medicaid Other | Admitting: Student

## 2019-07-08 ENCOUNTER — Encounter: Payer: Self-pay | Admitting: Obstetrics & Gynecology

## 2019-07-08 DIAGNOSIS — N83202 Unspecified ovarian cyst, left side: Secondary | ICD-10-CM | POA: Diagnosis not present

## 2019-07-08 NOTE — Progress Notes (Signed)
I connected with  Paula Gilbert on 07/08/19 at 10:15 AM EST by telephone and verified that I am speaking with the correct person using two identifiers.   I discussed the limitations, risks, security and privacy concerns of performing an evaluation and management service by telephone and the availability of in person appointments. I also discussed with the patient that there may be a patient responsible charge related to this service. The patient expressed understanding and agreed to proceed.  Verdell Carmine, RN 07/08/2019  10:09 AM

## 2019-07-08 NOTE — Patient Instructions (Signed)
Ovarian Cyst An ovarian cyst is a fluid-filled sac on an ovary. The ovaries are organs that make eggs in women. Most ovarian cysts go away on their own and are not cancerous (are benign). Some cysts need treatment. Follow these instructions at home:  Take over-the-counter and prescription medicines only as told by your doctor.  Do not drive or use heavy machinery while taking prescription pain medicine.  Get pelvic exams and Pap tests as often as told by your doctor.  Return to your normal activities as told by your doctor. Ask your doctor what activities are safe for you.  Do not use any products that contain nicotine or tobacco, such as cigarettes and e-cigarettes. If you need help quitting, ask your doctor.  Keep all follow-up visits as told by your doctor. This is important. Contact a doctor if:  Your periods are: ? Late. ? Irregular. ? Painful.   Your periods stop.  You have pelvic pain that does not go away.  You have pressure on your bladder.  You have trouble making your bladder empty when you pee (urinate).  You have pain during sex.  You have any of the following in your belly (abdomen): ? A feeling of fullness. ? Pressure. ? Discomfort. ? Pain that does not go away. ? Swelling.  You feel sick most of the time.  You have trouble pooping (have constipation).  You are not as hungry as usual (you lose your appetite).  You get very bad acne.  You start to have more hair on your body and face.  You are gaining weight or losing weight without changing your exercise and eating habits.  You think you may be pregnant. Get help right away if:  You have belly pain that is very bad or gets worse.  You cannot eat or drink without throwing up (vomiting).  You suddenly get a fever.  Your period is a lot heavier than usual. This information is not intended to replace advice given to you by your health care provider. Make sure you discuss any questions you have  with your health care provider. Document Revised: 04/03/2017 Document Reviewed: 09/23/2015 Elsevier Patient Education  2020 [employer].
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
## 2019-07-15 ENCOUNTER — Other Ambulatory Visit: Payer: Self-pay

## 2019-07-15 ENCOUNTER — Ambulatory Visit (HOSPITAL_COMMUNITY)
Admission: RE | Admit: 2019-07-15 | Discharge: 2019-07-15 | Disposition: A | Discharge status: Home / Self Care | Payer: Medicaid Other | Source: Ambulatory Visit | Attending: Family Medicine | Admitting: Family Medicine

## 2019-07-15 DIAGNOSIS — N83202 Unspecified ovarian cyst, left side: Secondary | ICD-10-CM | POA: Insufficient documentation

## 2019-07-18 ENCOUNTER — Telehealth (INDEPENDENT_AMBULATORY_CARE_PROVIDER_SITE_OTHER): Payer: Medicaid Other | Admitting: General Practice

## 2019-07-18 NOTE — Telephone Encounter (Signed)
Patient called and left message on nurse voicemail line stating she is calling about her results.  Called patient, no answer- left message stating we are trying to reach you to return your phone call, please call us back if you still need assistance.

## 2019-07-28 ENCOUNTER — Ambulatory Visit (INDEPENDENT_AMBULATORY_CARE_PROVIDER_SITE_OTHER): Payer: Medicaid Other | Admitting: Obstetrics and Gynecology

## 2019-07-28 ENCOUNTER — Other Ambulatory Visit: Payer: Self-pay

## 2019-07-28 ENCOUNTER — Encounter: Payer: Self-pay | Admitting: Obstetrics and Gynecology

## 2019-07-28 VITALS — BP 130/93 | HR 79 | Ht 67.0 in | Wt 141.2 lb

## 2019-07-28 DIAGNOSIS — N83202 Unspecified ovarian cyst, left side: Secondary | ICD-10-CM | POA: Diagnosis not present

## 2019-07-28 NOTE — Patient Instructions (Addendum)
Exploratory Laparotomy, Adult, Care After °This sheet gives you information about how to care for yourself after your procedure. Your health care provider may also give you more specific instructions. If you have problems or questions, contact your health care provider. °What can I expect after the procedure? °After the procedure, it is common to have: °· Abdominal soreness. °· Fatigue. °· A sore throat from the tube in your throat. °· A lack of appetite. °Follow these instructions at home: °Medicines °· Take over-the-counter and prescription medicines only as told by your health care provider. °· If you were prescribed an antibiotic medicine, take it as told by your health care provider. Do not stop taking the antibiotic even if you start to feel better. °· Do not drive or operate heavy machinery while taking pain medicine. °· If you are taking prescription pain medicine, take actions to prevent or treat constipation. Your health care provider may recommend that you: °? Drink enough fluid to keep your urine pale yellow. °? Eat foods that are high in fiber, such as fresh fruits and vegetables, whole grains, and beans. °? Limit foods that are high in fat and processed sugars, such as fried or sweet foods. °? Take an over-the-counter or prescription medicine for constipation. Undergoing surgery and taking pain medicines can make constipation worse. °Incision care ° °· Follow instructions from your health care provider about how to take care of your incision. Make sure you: °? Wash your hands with soap and water before you change your bandage (dressing). If soap and water are not available, use hand sanitizer. °? Change your dressing as told by your health care provider. °? Leave stitches (sutures), skin glue, or adhesive strips in place. These skin closures may need to stay in place for 2 weeks or longer. If adhesive strip edges start to loosen and curl up, you may trim the loose edges. Do not remove adhesive strips  completely unless your health care provider tells you to do that. °· If you were sent home with a drain, follow instructions from your health care provider about how to care for it. °· Check your incision area every day for signs of infection. Check for: °? Redness, swelling, or pain. °? Fluid or blood. °? Warmth. °? Pus or a bad smell. °Activity ° °· Rest as told by your health care provider. °? Avoid sitting for a long time without moving. Get up to take short walks every 1-2 hours. This is important to improve blood flow and breathing. Ask for help if you feel weak or unsteady. °· Do not lift anything that is heavier than 5 lb (2.2 kg), or the limit that your health care provider tells you, until he or she says that it is safe. °· Ask your health care provider when you can start to do your usual activities again, such as driving, going back to work, and having sex. °Eating and drinking °· You may eat what you usually eat. Include lots of whole grains, fruits, and vegetables in your diet. This will help to prevent constipation. °· Drink enough fluid to keep your urine pale yellow. °Bathing °· Keep your incision clean and dry. Clean it as often as told by your health care provider: °? Gently wash the incision with soap and water. °? Rinse the incision with water to remove all soap. °? Pat the incision dry with a clean towel. Do not rub the incision. °· You may take showers after 48 hours. °· Do not take baths,   swim, or use a hot tub until your health care provider says it is okay to do so. General instructions  Do not use any products that contain nicotine or tobacco, such as cigarettes and e-cigarettes. These can delay healing after surgery. If you need help quitting, ask your health care provider.  Wear compression stockings as told by your health care provider. These stockings help to prevent blood clots and reduce swelling in your legs.  Keep all follow-up visits as told by your health care provider.  This is important. Contact a health care provider if:  You have a fever.  You have chills.  Your pain medicine is not helping.  You have constipation or diarrhea.  You have nausea or vomiting.  You have drainage, redness, swelling, or pain at your incision site. Get help right away if:  Your pain is getting worse.  You have not had a bowel movement for more than 3 days.  You have ongoing (persistent) vomiting.  The edges of your incision open up.  You have warmth, tenderness, and swelling in your calf.  You have trouble breathing.  You have chest pain. These symptoms may represent a serious problem that is an emergency. Do not wait to see if the symptoms will go away. Get medical help right away. Call your local emergency services (911 in the [place]). Do not drive yourself to the hospital. Summary  Abdominal soreness is common after exploratory laparotomy. Take over-the-counter and prescription pain medicines only as told by your health care provider.  Follow instructions from your health care provider about how to take care of your incision. Do not take baths, swim, or use a hot tub until your health care provider says it is okay to do so.  Watch for signs and symptoms of infection after surgery, including fever, chills, drainage from your incision, and worsening abdominal pain. This information is not intended to replace advice given to you by your health care provider. Make sure you discuss any questions you have with your health care provider. Document Revised: 06/14/2018 Document Reviewed: 05/01/2017 Elsevier Patient Education  2020 [place] [facility] AT [facility] [place], [place] [address], [place]  [address] Phone - [phone]   Fax - Page [address] [place], [place]  [address] Phone - [phone]   Fax - [phone] [facility]  [place] [address] [place], [place]  [address] Phone - [phone]   Fax - [phone]  [place] [address] [place], [place]  [address] Phone - [phone]   Fax - [phone]  [facility] AT [place], [place], [place]  [address] Phone - [phone]   Fax - [place] [address] [place], [place]  [address] Phone - [phone]   Fax - [phone]  [facility] [address], [place], [place]  [address] Phone - [phone]   [place] [address] [place], [place]  [address] Phone - [phone]   Fax - [place], [place]  [address] Phone - [phone]   Fax - [phone]    [place] [address] [place], [place]  [address] Phone - [phone]   Fax - [place]  [place], [place]  [address] Phone - [phone]   Fax - [phone]  [place] [phone] [place], [place]  [address] Phone - [phone]   Fax - [place] [address] [place], [place]  [address] Phone - [phone]   Fax - [place] [address], [place], [place]  [address] Phone - [phone]   Fax - [phone]

## 2019-07-28 NOTE — Progress Notes (Signed)
Ms Malady presents for surgerical evaluation for large left adnexal mass. Mass first note with recent pregnancy. IOL at 37 weeks d/t to mass.  PP period complicated by PEC. Hospitalized for magnesium and started on Procardia. She denies HA today and is still taking her Procardia  Pt had follow up U/S which confirmed complex left adnexal mass.  She does report some abd pain. Denies bowel or bladder dysfunction. On Depo Provera for contraception. LMP currently  PE AF VSS Lungs clear Heart RRR Abd soft + BS abd/pelvic mass effect noted @ 15 weeks Pelvic NL EGBUS, menses noted, Left adnexal fullness,   A/P Left Adnexal Mass Will check CA 125. Discussed if in normal range, will schedule for Exp lap with ovarian cystectomy or oophorectomy. R/B/Post op care reviewed. If elevated will refer to GYN Onc for further eval. F/U per test/surgery

## 2019-07-29 ENCOUNTER — Telehealth: Payer: Self-pay | Admitting: Family Medicine

## 2019-07-29 LAB — CA 125: Cancer Antigen (CA) 125: 35.9 U/mL (ref 0.0–38.1)

## 2019-07-29 NOTE — Telephone Encounter (Signed)
I called Marth and notified her that her CA 125 came back in normal range and that per Dr. Marjory Lies plan - if it was normal he would schedule her for surgery. I informed her once he reviews ; then he will notify the scheduler to schedule the surgery . I explained she will get  A letter when it is scheduled - likely not for a few weeks and likely surgery will be in a month or two. She voices understanding. Ayaan Shutes,RN

## 2019-07-29 NOTE — Telephone Encounter (Signed)
The patient stated she would like to discuss the results in mychart. She stated she does not understand them and she was also told she would have a surgery date and see does not see it. She would like a call back to review the information with her.

## 2019-09-06 NOTE — Progress Notes (Signed)
[facility] [phone] - [place], [place] AT [place] [address] Phone: [phone] Fax: [phone]      Your procedure is scheduled on Sep 13, 2019.  Report to [facility] Main Entrance "A" at 10:00 A.M., and check in at the Admitting office.  Call this number if you have problems the morning of surgery:  [phone]  Call [phone] if you have any questions prior to your surgery date Monday-Friday 8am-4pm    Remember:  Do not eat after midnight the night before your surgery  You may drink clear liquids until 09:00 AM the morning of your surgery.   Clear liquids allowed are: Water, Non-Citrus Juices (without pulp), Carbonated Beverages, Clear Tea, Black Coffee Only, and Gatorade    Take these medicines the morning of surgery with A SIP OF WATER : NONE  As of today, STOP taking any Aspirin (unless otherwise instructed by your surgeon) and Aspirin containing products, Aleve, Naproxen, Ibuprofen, Motrin, Advil, Goody's, BC's, all herbal medications, fish oil, and all vitamins.                     Do not wear jewelry, make up, or nail polish            Do not wear lotions, powders, perfumes, or deodorant.            Do not shave 48 hours prior to surgery.              Do not bring valuables to the hospital.            [facility] is not responsible for any belongings or valuables.  Do NOT Smoke (Tobacco/Vapping) or drink Alcohol 24 hours prior to your procedure If you use a CPAP at night, you may bring all equipment for your overnight stay.   Contacts, glasses, dentures or bridgework may not be worn into surgery.      For patients admitted to the hospital, discharge time will be determined by your treatment team.   Patients discharged the day of surgery will not be allowed to drive home, and someone needs to stay with them for 24 hours.    Special instructions:   [place]- Preparing  For Surgery  Before surgery, you can play an important role. Because skin is not sterile, your skin needs to be as free of germs as possible. You can reduce the number of germs on your skin by washing with CHG (chlorahexidine gluconate) Soap before surgery.  CHG is an antiseptic cleaner which kills germs and bonds with the skin to continue killing germs even after washing.    Oral Hygiene is also important to reduce your risk of infection.  Remember - BRUSH YOUR TEETH THE MORNING OF SURGERY WITH YOUR REGULAR TOOTHPASTE  Please do not use if you have an allergy to CHG or antibacterial soaps. If your skin becomes reddened/irritated stop using the CHG.  Do not shave (including legs and underarms) for at least 48 hours prior to first CHG shower. It is OK to shave your face.  Please follow these instructions carefully.   1. Shower the NIGHT BEFORE SURGERY and the MORNING OF SURGERY with CHG Soap.   2. If you chose to wash your hair, wash your hair first as usual with your normal shampoo.  3. After you shampoo, rinse your hair and body thoroughly to remove the shampoo.  4.  Use CHG as you would any other liquid soap. You can apply CHG directly to the skin and wash gently with a scrungie or a clean washcloth.   5. Apply the CHG Soap to your body ONLY FROM THE NECK DOWN.  Do not use on open wounds or open sores. Avoid contact with your eyes, ears, mouth and genitals (private parts). Wash Face and genitals (private parts)  with your normal soap.   6. Wash thoroughly, paying special attention to the area where your surgery will be performed.  7. Thoroughly rinse your body with warm water from the neck down.  8. DO NOT shower/wash with your normal soap after using and rinsing off the CHG Soap.  9. Pat yourself dry with a CLEAN TOWEL.  10. Wear CLEAN PAJAMAS to bed the night before surgery, wear comfortable clothes the morning of surgery  11. Place CLEAN SHEETS on your bed the night of your first  shower and DO NOT SLEEP WITH PETS.   Day of Surgery:   Do not apply any deodorants/lotions.  Please wear clean clothes to the hospital/surgery center.   Remember to brush your teeth WITH YOUR REGULAR TOOTHPASTE.   Please read over the following fact sheets that you were given.

## 2019-09-07 ENCOUNTER — Encounter (HOSPITAL_COMMUNITY): Payer: Self-pay

## 2019-09-07 ENCOUNTER — Other Ambulatory Visit: Payer: Self-pay

## 2019-09-07 ENCOUNTER — Encounter (HOSPITAL_COMMUNITY)
Admission: RE | Admit: 2019-09-07 | Discharge: 2019-09-07 | Disposition: A | Discharge status: Home / Self Care | Payer: Medicaid Other | Source: Ambulatory Visit | Attending: Obstetrics and Gynecology | Admitting: Obstetrics and Gynecology

## 2019-09-07 DIAGNOSIS — Z01818 Encounter for other preprocedural examination: Secondary | ICD-10-CM | POA: Insufficient documentation

## 2019-09-07 LAB — CBC
HCT: 44 % (ref 36.0–46.0)
Hemoglobin: 14.5 g/dL (ref 12.0–15.0)
MCH: 30.4 pg (ref 26.0–34.0)
MCHC: 33 g/dL (ref 30.0–36.0)
MCV: 92.2 fL (ref 80.0–100.0)
Platelets: 323 10*3/uL (ref 150–400)
RBC: 4.77 MIL/uL (ref 3.87–5.11)
RDW: 13.5 % (ref 11.5–15.5)
WBC: 9 10*3/uL (ref 4.0–10.5)
nRBC: 0 % (ref 0.0–0.2)

## 2019-09-07 LAB — TYPE AND SCREEN
ABO/RH(D): B POS
Antibody Screen: NEGATIVE

## 2019-09-07 LAB — BASIC METABOLIC PANEL
Anion gap: 8 (ref 5–15)
BUN: 10 mg/dL (ref 6–20)
CO2: 25 mmol/L (ref 22–32)
Calcium: 9.5 mg/dL (ref 8.9–10.3)
Chloride: 109 mmol/L (ref 98–111)
Creatinine, Ser: 0.86 mg/dL (ref 0.44–1.00)
GFR calc Af Amer: >60 mL/min (ref >60)
GFR calc non Af Amer: >60 mL/min (ref >60)
Glucose, Bld: 93 mg/dL (ref 70–99)
Potassium: 4.1 mmol/L (ref 3.5–5.1)
Sodium: 142 mmol/L (ref 135–145)

## 2019-09-07 NOTE — Progress Notes (Signed)
PCP -none-  But is starting new pcp at [facility]  soon Cardiologist - na     Chest x-ray - na EKG - today Stress Test - na ECHO - na Cardiac Cath - na   -    Blood Thinner Instructions:na Aspirin Instructions: na ERAS Protcol -yes PRE-SURGERY Ensure or G2-  Pt instructed on drink protocal for surgery  COVID TEST- for sat   Anesthesia review: hx htn  Patient denies shortness of breath, fever, cough and chest pain at PAT appointment   All instructions explained to the patient, with a verbal understanding of the material. Patient agrees to go over the instructions while at home for a better understanding. Patient also instructed to self quarantine after being tested for COVID-19. The opportunity to ask questions was provided.
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
## 2019-09-08 NOTE — Anesthesia Preprocedure Evaluation (Deleted)
Anesthesia Evaluation  Patient identified by MRN, date of birth, ID band Patient awake    Reviewed: Allergy & Precautions, H&P , NPO status , Patient's Chart, lab work & pertinent test results, reviewed documented beta blocker date and time   Airway Mallampati: II  TM Distance: >3 FB Neck ROM: full    Dental no notable dental hx.    Pulmonary neg pulmonary ROS, Current Smoker,    Pulmonary exam normal breath sounds clear to auscultation       Cardiovascular Exercise Tolerance: Good hypertension, negative cardio ROS   Rhythm:regular Rate:Normal     Neuro/Psych negative neurological ROS  negative psych ROS   GI/Hepatic negative GI ROS, Neg liver ROS,   Endo/Other  negative endocrine ROS  Renal/GU negative Renal ROS  negative genitourinary   Musculoskeletal   Abdominal   Peds  Hematology negative hematology ROS (+)   Anesthesia Other Findings   Reproductive/Obstetrics negative OB ROS                                                             Anesthesia Evaluation  Patient identified by MRN, date of birth, ID band Patient awake    Reviewed: Allergy & Precautions, H&P , NPO status , Patient's Chart, lab work & pertinent test results, reviewed documented beta blocker date and time   Airway Mallampati: II  TM Distance: >3 FB Neck ROM: full    Dental no notable dental hx. (+) Dental Advisory Given, Teeth Intact   Pulmonary neg pulmonary ROS, former smoker,    Pulmonary exam normal breath sounds clear to auscultation       Cardiovascular hypertension, negative cardio ROS Normal cardiovascular exam Rhythm:regular Rate:Normal     Neuro/Psych negative neurological ROS  negative psych ROS   GI/Hepatic negative GI ROS, Neg liver ROS,   Endo/Other  negative endocrine ROS  Renal/GU negative Renal ROS  negative genitourinary   Musculoskeletal   Abdominal   Peds   Hematology negative hematology ROS (+)   Anesthesia Other Findings   Reproductive/Obstetrics (+) Pregnancy                             Anesthesia Physical Anesthesia Plan  ASA: II  Anesthesia Plan: Epidural   Post-op Pain Management:    Induction:   PONV Risk Score and Plan:   Airway Management Planned:   Additional Equipment:   Intra-op Plan:   Post-operative Plan:   Informed Consent: I have reviewed the patients History and Physical, chart, labs and discussed the procedure including the risks, benefits and alternatives for the proposed anesthesia with the patient or authorized representative who has indicated his/her understanding and acceptance.       Plan Discussed with: Anesthesiologist  Anesthesia Plan Comments:         Anesthesia Quick Evaluation                                   Anesthesia Evaluation  Patient identified by MRN, date of birth, ID band Patient awake    Reviewed: Allergy & Precautions, H&P , NPO status , Patient's Chart, lab work & pertinent test results, reviewed documented beta blocker date and time  Airway Mallampati: II  TM Distance: >3 FB Neck ROM: full    Dental no notable dental hx. (+) Dental Advisory Given, Teeth Intact   Pulmonary neg pulmonary ROS, former smoker,    Pulmonary exam normal breath sounds clear to auscultation       Cardiovascular hypertension, negative cardio ROS Normal cardiovascular exam Rhythm:regular Rate:Normal     Neuro/Psych negative neurological ROS  negative psych ROS   GI/Hepatic negative GI ROS, Neg liver ROS,   Endo/Other  negative endocrine ROS  Renal/GU negative Renal ROS  negative genitourinary   Musculoskeletal   Abdominal   Peds  Hematology negative hematology ROS (+)   Anesthesia Other Findings   Reproductive/Obstetrics (+) Pregnancy                             Anesthesia Physical Anesthesia  Plan  ASA: II  Anesthesia Plan: Epidural   Post-op Pain Management:    Induction:   PONV Risk Score and Plan:   Airway Management Planned:   Additional Equipment:   Intra-op Plan:   Post-operative Plan:   Informed Consent: I have reviewed the patients History and Physical, chart, labs and discussed the procedure including the risks, benefits and alternatives for the proposed anesthesia with the patient or authorized representative who has indicated his/her understanding and acceptance.       Plan Discussed with: Anesthesiologist  Anesthesia Plan Comments:         Anesthesia Quick Evaluation  Anesthesia Physical Anesthesia Plan  ASA: II  Anesthesia Plan: General   Post-op Pain Management:    Induction:   PONV Risk Score and Plan: 3 and Ondansetron and Treatment may vary due to age or medical condition  Airway Management Planned: Oral ETT  Additional Equipment:   Intra-op Plan:   Post-operative Plan:   Informed Consent: I have reviewed the patients History and Physical, chart, labs and discussed the procedure including the risks, benefits and alternatives for the proposed anesthesia with the patient or authorized representative who has indicated his/her understanding and acceptance.     Dental Advisory Given  Plan Discussed with: CRNA and Anesthesiologist  Anesthesia Plan Comments: (PAT note written 09/08/2019 by Myra Gianotti, PA-C. )       Anesthesia Quick Evaluation
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
## 2019-09-10 ENCOUNTER — Other Ambulatory Visit (HOSPITAL_COMMUNITY)
Admission: RE | Admit: 2019-09-10 | Discharge: 2019-09-10 | Disposition: A | Discharge status: Home / Self Care | Payer: Medicaid Other | Source: Ambulatory Visit | Attending: Obstetrics and Gynecology | Admitting: Obstetrics and Gynecology

## 2019-09-10 DIAGNOSIS — Z20822 Contact with and (suspected) exposure to covid-19: Secondary | ICD-10-CM | POA: Insufficient documentation

## 2019-09-10 DIAGNOSIS — Z01812 Encounter for preprocedural laboratory examination: Secondary | ICD-10-CM | POA: Insufficient documentation

## 2019-09-10 LAB — SARS CORONAVIRUS 2 (TAT 6-24 HRS): SARS Coronavirus 2: NEGATIVE

## 2019-09-13 ENCOUNTER — Inpatient Hospital Stay (HOSPITAL_COMMUNITY): Payer: Medicaid Other | Admitting: Vascular Surgery

## 2019-09-13 ENCOUNTER — Encounter (HOSPITAL_COMMUNITY): Payer: Self-pay | Admitting: Obstetrics and Gynecology

## 2019-09-13 ENCOUNTER — Encounter (HOSPITAL_COMMUNITY)
Admission: RE | Disposition: A | Discharge status: Home / Self Care | Payer: Self-pay | Source: Home / Self Care | Attending: Obstetrics and Gynecology

## 2019-09-13 ENCOUNTER — Inpatient Hospital Stay (HOSPITAL_COMMUNITY)
Admission: RE | Admit: 2019-09-13 | Discharge: 2019-09-15 | DRG: 743 | Disposition: A | Discharge status: Home / Self Care | Payer: Medicaid Other | Attending: Obstetrics and Gynecology | Admitting: Obstetrics and Gynecology

## 2019-09-13 ENCOUNTER — Inpatient Hospital Stay (HOSPITAL_COMMUNITY): Payer: Medicaid Other | Admitting: Anesthesiology

## 2019-09-13 ENCOUNTER — Other Ambulatory Visit: Payer: Self-pay

## 2019-09-13 DIAGNOSIS — R1903 Right lower quadrant abdominal swelling, mass and lump: Secondary | ICD-10-CM | POA: Diagnosis present

## 2019-09-13 DIAGNOSIS — N83202 Unspecified ovarian cyst, left side: Secondary | ICD-10-CM

## 2019-09-13 DIAGNOSIS — N838 Other noninflammatory disorders of ovary, fallopian tube and broad ligament: Secondary | ICD-10-CM | POA: Diagnosis not present

## 2019-09-13 DIAGNOSIS — Z833 Family history of diabetes mellitus: Secondary | ICD-10-CM | POA: Diagnosis not present

## 2019-09-13 DIAGNOSIS — Z48816 Encounter for surgical aftercare following surgery on the genitourinary system: Secondary | ICD-10-CM | POA: Diagnosis not present

## 2019-09-13 DIAGNOSIS — N83201 Unspecified ovarian cyst, right side: Secondary | ICD-10-CM

## 2019-09-13 DIAGNOSIS — Z8249 Family history of ischemic heart disease and other diseases of the circulatory system: Secondary | ICD-10-CM | POA: Diagnosis not present

## 2019-09-13 DIAGNOSIS — F17210 Nicotine dependence, cigarettes, uncomplicated: Secondary | ICD-10-CM | POA: Diagnosis present

## 2019-09-13 DIAGNOSIS — D271 Benign neoplasm of left ovary: Secondary | ICD-10-CM | POA: Diagnosis present

## 2019-09-13 DIAGNOSIS — Z9889 Other specified postprocedural states: Secondary | ICD-10-CM

## 2019-09-13 HISTORY — PX: LAPAROTOMY: SHX154

## 2019-09-13 HISTORY — PX: OOPHORECTOMY: SHX6387

## 2019-09-13 HISTORY — PX: LAPAROSCOPIC OVARIAN CYSTECTOMY: SUR786

## 2019-09-13 LAB — POCT PREGNANCY, URINE: Preg Test, Ur: NEGATIVE

## 2019-09-13 SURGERY — LAPAROTOMY
Anesthesia: General | Site: Abdomen

## 2019-09-13 MED ORDER — OXYCODONE HCL 5 MG PO TABS
ORAL_TABLET | ORAL | Status: AC
  Filled 2019-09-13: qty 1

## 2019-09-13 MED ORDER — MIDAZOLAM HCL 5 MG/5ML IJ SOLN
INTRAMUSCULAR | Status: DC | PRN
  Administered 2019-09-13: 2 mg via INTRAVENOUS

## 2019-09-13 MED ORDER — ONDANSETRON HCL 4 MG/2ML IJ SOLN: 4.0000 mg | Freq: Once | INTRAMUSCULAR | Status: DC | PRN

## 2019-09-13 MED ORDER — FENTANYL CITRATE (PF) 100 MCG/2ML IJ SOLN
INTRAMUSCULAR | Status: DC | PRN
  Administered 2019-09-13: 100 ug via INTRAVENOUS
  Administered 2019-09-13: 50 ug via INTRAVENOUS

## 2019-09-13 MED ORDER — ONDANSETRON HCL 4 MG PO TABS: 4.0000 mg | ORAL_TABLET | Freq: Four times a day (QID) | ORAL | Status: DC | PRN

## 2019-09-13 MED ORDER — DEXAMETHASONE SODIUM PHOSPHATE 10 MG/ML IJ SOLN
INTRAMUSCULAR | Status: AC
  Filled 2019-09-13: qty 1

## 2019-09-13 MED ORDER — ACETAMINOPHEN 160 MG/5ML PO SOLN: 325.0000 mg | ORAL | Status: DC | PRN

## 2019-09-13 MED ORDER — SENNA 8.6 MG PO TABS
1.0000 | ORAL_TABLET | Freq: Two times a day (BID) | ORAL | Status: DC
  Administered 2019-09-13 – 2019-09-15 (×5): 8.6 mg via ORAL
  Filled 2019-09-13 (×5): qty 1

## 2019-09-13 MED ORDER — ROCURONIUM BROMIDE 10 MG/ML (PF) SYRINGE
PREFILLED_SYRINGE | INTRAVENOUS | Status: AC
  Filled 2019-09-13: qty 10

## 2019-09-13 MED ORDER — FENTANYL CITRATE (PF) 100 MCG/2ML IJ SOLN
INTRAMUSCULAR | Status: AC
  Filled 2019-09-13: qty 2

## 2019-09-13 MED ORDER — MEPERIDINE HCL 25 MG/ML IJ SOLN: 6.2500 mg | INTRAMUSCULAR | Status: DC | PRN

## 2019-09-13 MED ORDER — OXYCODONE HCL 5 MG/5ML PO SOLN: 5.0000 mg | Freq: Once | ORAL | Status: AC | PRN

## 2019-09-13 MED ORDER — ROCURONIUM 10MG/ML (10ML) SYRINGE FOR MEDFUSION PUMP - OPTIME
INTRAVENOUS | Status: DC | PRN
  Administered 2019-09-13: 60 mg via INTRAVENOUS
  Administered 2019-09-13: 20 mg via INTRAVENOUS

## 2019-09-13 MED ORDER — MIDAZOLAM HCL 2 MG/2ML IJ SOLN
INTRAMUSCULAR | Status: AC
  Filled 2019-09-13: qty 2

## 2019-09-13 MED ORDER — OXYCODONE HCL 5 MG PO TABS
5.0000 mg | ORAL_TABLET | Freq: Once | ORAL | Status: AC | PRN
  Administered 2019-09-13: 5 mg via ORAL

## 2019-09-13 MED ORDER — ONDANSETRON HCL 4 MG/2ML IJ SOLN
INTRAMUSCULAR | Status: AC
  Filled 2019-09-13: qty 2

## 2019-09-13 MED ORDER — IBUPROFEN 800 MG PO TABS
800.0000 mg | ORAL_TABLET | Freq: Three times a day (TID) | ORAL | Status: DC
  Administered 2019-09-13 – 2019-09-15 (×6): 800 mg via ORAL
  Filled 2019-09-13 (×6): qty 1

## 2019-09-13 MED ORDER — CEFAZOLIN SODIUM-DEXTROSE 2-4 GM/100ML-% IV SOLN
2.0000 g | INTRAVENOUS | Status: AC
  Administered 2019-09-13: 2 g via INTRAVENOUS
  Filled 2019-09-13: qty 100

## 2019-09-13 MED ORDER — KETOROLAC TROMETHAMINE 30 MG/ML IJ SOLN
30.0000 mg | Freq: Four times a day (QID) | INTRAMUSCULAR | Status: AC
  Administered 2019-09-13 – 2019-09-14 (×4): 30 mg via INTRAVENOUS
  Filled 2019-09-13 (×4): qty 1

## 2019-09-13 MED ORDER — LACTATED RINGERS IV SOLN: INTRAVENOUS | Status: DC

## 2019-09-13 MED ORDER — BISACODYL 10 MG RE SUPP: 10.0000 mg | Freq: Every day | RECTAL | Status: DC | PRN

## 2019-09-13 MED ORDER — LIDOCAINE 2% (20 MG/ML) 5 ML SYRINGE
INTRAMUSCULAR | Status: AC
  Filled 2019-09-13: qty 5

## 2019-09-13 MED ORDER — ONDANSETRON HCL 4 MG/2ML IJ SOLN: 4.0000 mg | Freq: Four times a day (QID) | INTRAMUSCULAR | Status: DC | PRN

## 2019-09-13 MED ORDER — PROPOFOL 10 MG/ML IV BOLUS
INTRAVENOUS | Status: AC
  Filled 2019-09-13: qty 40

## 2019-09-13 MED ORDER — FENTANYL CITRATE (PF) 250 MCG/5ML IJ SOLN
INTRAMUSCULAR | Status: AC
  Filled 2019-09-13: qty 5

## 2019-09-13 MED ORDER — FENTANYL CITRATE (PF) 100 MCG/2ML IJ SOLN
25.0000 ug | INTRAMUSCULAR | Status: DC | PRN
  Administered 2019-09-13: 25 ug via INTRAVENOUS
  Administered 2019-09-13: 50 ug via INTRAVENOUS
  Administered 2019-09-13: 25 ug via INTRAVENOUS
  Administered 2019-09-13: 50 ug via INTRAVENOUS

## 2019-09-13 MED ORDER — ZOLPIDEM TARTRATE 5 MG PO TABS: 5.0000 mg | ORAL_TABLET | Freq: Every evening | ORAL | Status: DC | PRN

## 2019-09-13 MED ORDER — OXYCODONE-ACETAMINOPHEN 5-325 MG PO TABS
2.0000 | ORAL_TABLET | ORAL | Status: DC | PRN
  Administered 2019-09-13 – 2019-09-15 (×8): 2 via ORAL
  Filled 2019-09-13 (×8): qty 2

## 2019-09-13 MED ORDER — DEXAMETHASONE SODIUM PHOSPHATE 4 MG/ML IJ SOLN
INTRAMUSCULAR | Status: DC | PRN
  Administered 2019-09-13: 10 mg via INTRAVENOUS

## 2019-09-13 MED ORDER — KETOROLAC TROMETHAMINE 15 MG/ML IJ SOLN
15.0000 mg | INTRAMUSCULAR | Status: AC
  Administered 2019-09-13: 09:00:00 30 mg via INTRAVENOUS
  Filled 2019-09-13: qty 1

## 2019-09-13 MED ORDER — LIDOCAINE 2% (20 MG/ML) 5 ML SYRINGE
INTRAMUSCULAR | Status: DC | PRN
  Administered 2019-09-13: 60 mg via INTRAVENOUS

## 2019-09-13 MED ORDER — FENTANYL CITRATE (PF) 100 MCG/2ML IJ SOLN
50.0000 ug | Freq: Once | INTRAMUSCULAR | Status: AC
  Administered 2019-09-13: 50 ug via INTRAVENOUS

## 2019-09-13 MED ORDER — ACETAMINOPHEN 325 MG PO TABS: 325.0000 mg | ORAL_TABLET | ORAL | Status: DC | PRN

## 2019-09-13 MED ORDER — OXYCODONE-ACETAMINOPHEN 5-325 MG PO TABS
1.0000 | ORAL_TABLET | ORAL | Status: DC | PRN
  Administered 2019-09-14: 1 via ORAL
  Filled 2019-09-13: qty 1

## 2019-09-13 MED ORDER — SUGAMMADEX SODIUM 200 MG/2ML IV SOLN
INTRAVENOUS | Status: DC | PRN
  Administered 2019-09-13: 200 mg via INTRAVENOUS

## 2019-09-13 MED ORDER — ACETAMINOPHEN 500 MG PO TABS
1000.0000 mg | ORAL_TABLET | ORAL | Status: AC
  Administered 2019-09-13: 1000 mg via ORAL
  Filled 2019-09-13: qty 2

## 2019-09-13 MED ORDER — HYDROMORPHONE HCL 1 MG/ML IJ SOLN
1.0000 mg | INTRAMUSCULAR | Status: DC | PRN
  Administered 2019-09-13 – 2019-09-15 (×4): 1 mg via INTRAVENOUS
  Filled 2019-09-13 (×4): qty 1

## 2019-09-13 MED ORDER — ONDANSETRON HCL 4 MG/2ML IJ SOLN
INTRAMUSCULAR | Status: DC | PRN
  Administered 2019-09-13: 4 mg via INTRAVENOUS

## 2019-09-13 MED ORDER — SIMETHICONE 80 MG PO CHEW: 80.0000 mg | CHEWABLE_TABLET | Freq: Four times a day (QID) | ORAL | Status: DC | PRN

## 2019-09-13 MED ORDER — BUPIVACAINE-EPINEPHRINE (PF) 0.25% -1:200000 IJ SOLN
INTRAMUSCULAR | Status: DC | PRN
  Administered 2019-09-13 (×2): 20 mL

## 2019-09-13 MED ORDER — PROPOFOL 10 MG/ML IV BOLUS
INTRAVENOUS | Status: DC | PRN
  Administered 2019-09-13: 150 mg via INTRAVENOUS

## 2019-09-13 MED ORDER — BUPIVACAINE LIPOSOME 1.3 % IJ SUSP
INTRAMUSCULAR | Status: DC | PRN
  Administered 2019-09-13 (×2): 10 mL via PERINEURAL

## 2019-09-13 MED ORDER — SOD CITRATE-CITRIC ACID 500-334 MG/5ML PO SOLN
30.0000 mL | ORAL | Status: AC
  Administered 2019-09-13: 30 mL via ORAL
  Filled 2019-09-13: qty 30

## 2019-09-13 MED ORDER — MIDAZOLAM HCL 2 MG/2ML IJ SOLN
0.5000 mg | Freq: Once | INTRAMUSCULAR | Status: AC
  Administered 2019-09-13: 0.5 mg via INTRAVENOUS

## 2019-09-13 MED ORDER — BUPIVACAINE HCL (PF) 0.5 % IJ SOLN
INTRAMUSCULAR | Status: AC
  Filled 2019-09-13: qty 30

## 2019-09-13 SURGICAL SUPPLY — 50 items
ADH SKN CLS APL DERMABOND .7 (GAUZE/BANDAGES/DRESSINGS) ×2
APL SKNCLS STERI-STRIP NONHPOA (GAUZE/BANDAGES/DRESSINGS) ×2
BARRIER ADHS 3X4 INTERCEED (GAUZE/BANDAGES/DRESSINGS) IMPLANT
BENZOIN TINCTURE PRP APPL 2/3 (GAUZE/BANDAGES/DRESSINGS) ×4 IMPLANT
BRR ADH 4X3 ABS CNTRL BYND (GAUZE/BANDAGES/DRESSINGS)
CANISTER SUCT 3000ML PPV (MISCELLANEOUS) ×4 IMPLANT
CLOSURE WOUND 1/2 X4 (GAUZE/BANDAGES/DRESSINGS) ×1
COVER WAND RF STERILE (DRAPES) ×4 IMPLANT
DECANTER SPIKE VIAL GLASS SM (MISCELLANEOUS) IMPLANT
DERMABOND ADVANCED (GAUZE/BANDAGES/DRESSINGS) ×2
DERMABOND ADVANCED .7 DNX12 (GAUZE/BANDAGES/DRESSINGS) IMPLANT
DRAPE WARM FLUID 44X44 (DRAPES) IMPLANT
DRSG OPSITE POSTOP 4X10 (GAUZE/BANDAGES/DRESSINGS) ×4 IMPLANT
DURAPREP 26ML APPLICATOR (WOUND CARE) ×4 IMPLANT
GAUZE SPONGE 4X4 16PLY XRAY LF (GAUZE/BANDAGES/DRESSINGS) ×4 IMPLANT
GLOVE BIO SURGEON STRL SZ7.5 (GLOVE) ×8 IMPLANT
GLOVE BIOGEL PI IND STRL 7.0 (GLOVE) ×4 IMPLANT
GLOVE BIOGEL PI INDICATOR 7.0 (GLOVE) ×4
GOWN STRL REUS W/ TWL LRG LVL3 (GOWN DISPOSABLE) ×4 IMPLANT
GOWN STRL REUS W/ TWL XL LVL3 (GOWN DISPOSABLE) ×2 IMPLANT
GOWN STRL REUS W/TWL LRG LVL3 (GOWN DISPOSABLE) ×8
GOWN STRL REUS W/TWL XL LVL3 (GOWN DISPOSABLE) ×4
HEMOSTAT SURGICEL 2X14 (HEMOSTASIS) ×2 IMPLANT
KIT TURNOVER KIT B (KITS) ×4 IMPLANT
NEEDLE HYPO 22GX1.5 SAFETY (NEEDLE) ×4 IMPLANT
NS IRRIG 1000ML POUR BTL (IV SOLUTION) ×4 IMPLANT
PACK ABDOMINAL GYN (CUSTOM PROCEDURE TRAY) ×4 IMPLANT
PAD OB MATERNITY 4.3X12.25 (PERSONAL CARE ITEMS) ×4 IMPLANT
RTRCTR C-SECT PINK 25CM LRG (MISCELLANEOUS) IMPLANT
SPONGE INTESTINAL PEANUT (DISPOSABLE) ×4 IMPLANT
SPONGE LAP 18X18 RF (DISPOSABLE) ×12 IMPLANT
STRIP CLOSURE SKIN 1/2X4 (GAUZE/BANDAGES/DRESSINGS) ×3 IMPLANT
SUT PLAIN 2 0 (SUTURE) ×4
SUT PLAIN ABS 2-0 CT1 27XMFL (SUTURE) ×2 IMPLANT
SUT VIC AB 0 CT1 18XCR BRD8 (SUTURE) IMPLANT
SUT VIC AB 0 CT1 27 (SUTURE) ×8
SUT VIC AB 0 CT1 27XBRD ANBCTR (SUTURE) ×4 IMPLANT
SUT VIC AB 0 CT1 8-18 (SUTURE)
SUT VIC AB 1 CT1 18XBRD ANBCTR (SUTURE) IMPLANT
SUT VIC AB 1 CT1 36 (SUTURE) IMPLANT
SUT VIC AB 1 CT1 8-18 (SUTURE)
SUT VIC AB 3-0 CT1 27 (SUTURE) ×16
SUT VIC AB 3-0 CT1 TAPERPNT 27 (SUTURE) ×2 IMPLANT
SUT VIC AB 3-0 SH 27 (SUTURE) ×8
SUT VIC AB 3-0 SH 27X BRD (SUTURE) IMPLANT
SUT VIC AB 4-0 KS 27 (SUTURE) ×4 IMPLANT
SUT VICRYL 1 TIES 12X18 (SUTURE) ×4 IMPLANT
SYR CONTROL 10ML LL (SYRINGE) ×4 IMPLANT
TOWEL GREEN STERILE FF (TOWEL DISPOSABLE) ×8 IMPLANT
TRAY FOLEY W/BAG SLVR 14FR (SET/KITS/TRAYS/PACK) ×4 IMPLANT

## 2019-09-13 NOTE — Anesthesia Preprocedure Evaluation (Addendum)
Anesthesia Evaluation  Patient identified by MRN, date of birth, ID band Patient awake    Reviewed: Allergy & Precautions, H&P , NPO status , Patient's Chart, lab work & pertinent test results, reviewed documented beta blocker date and time   Airway Mallampati: II  TM Distance: >3 FB Neck ROM: full    Dental no notable dental hx. (+) Poor Dentition, Chipped, Missing, Dental Advisory Given,    Pulmonary neg pulmonary ROS, Current Smoker,    Pulmonary exam normal breath sounds clear to auscultation       Cardiovascular Exercise Tolerance: Good hypertension, Pt. on medications negative cardio ROS   Rhythm:regular Rate:Normal     Neuro/Psych negative neurological ROS  negative psych ROS   GI/Hepatic negative GI ROS, Neg liver ROS,   Endo/Other  negative endocrine ROS  Renal/GU negative Renal ROS  negative genitourinary   Musculoskeletal   Abdominal   Peds  Hematology negative hematology ROS (+)   Anesthesia Other Findings   Reproductive/Obstetrics negative OB ROS                            Anesthesia Physical Anesthesia Plan  ASA: II  Anesthesia Plan: General   Post-op Pain Management:    Induction:   PONV Risk Score and Plan: 3 and Ondansetron, Dexamethasone and Treatment may vary due to age or medical condition  Airway Management Planned: Oral ETT  Additional Equipment:   Intra-op Plan:   Post-operative Plan:   Informed Consent: I have reviewed the patients History and Physical, chart, labs and discussed the procedure including the risks, benefits and alternatives for the proposed anesthesia with the patient or authorized representative who has indicated his/her understanding and acceptance.     Dental Advisory Given  Plan Discussed with: CRNA and Anesthesiologist  Anesthesia Plan Comments:         Anesthesia Quick Evaluation

## 2019-09-13 NOTE — Anesthesia Procedure Notes (Signed)
Procedure Name: Intubation Date/Time: 09/13/2019 9:05 AM Performed by: Lieutenant Diego, CRNA Pre-anesthesia Checklist: Patient identified, Emergency Drugs available, Suction available, Patient being monitored and Timeout performed Patient Re-evaluated:Patient Re-evaluated prior to induction Oxygen Delivery Method: Circle system utilized Preoxygenation: Pre-oxygenation with 100% oxygen Induction Type: IV induction and Cricoid Pressure applied Ventilation: Mask ventilation without difficulty Laryngoscope Size: Miller and 3 Grade View: Grade I Tube type: Oral Tube size: 7.0 mm Number of attempts: 1 Airway Equipment and Method: Stylet Placement Confirmation: ETT inserted through vocal cords under direct vision,  positive ETCO2 and breath sounds checked- equal and bilateral Secured at: 21 cm Tube secured with: Tape

## 2019-09-13 NOTE — Progress Notes (Signed)
Called to floor because patient stated "I have a missing tooth." Based on a quick chart review, the anesthesia team was well aware of her dental caries. It appears that one of her right lower premolars, which clearly had significant cavities, broke off while in the OR. It appears this was unwitnessed/unrecognized. She did not have any pulmonary symptoms. I explained to her that this is fairly common and that it might have been involuntary biting on her tube. She seemed satisfied with this explanation.

## 2019-09-13 NOTE — Addendum Note (Signed)
Addendum  created 09/13/19 1802 by Nolon Nations, MD   Clinical Note Signed

## 2019-09-13 NOTE — Anesthesia Procedure Notes (Signed)
Anesthesia Regional Block: TAP block   Pre-Anesthetic Checklist: ,, timeout performed, Correct Patient, Correct Site, Correct Laterality, Correct Procedure, Correct Position, site marked, Risks and benefits discussed,  Surgical consent,  Pre-op evaluation,  At surgeon's request and post-op pain management  Laterality: Left and Right  Prep: chloraprep       Needles:  Injection technique: Single-shot  Needle Type: Echogenic Stimulator Needle     Needle Length: 5cm  Needle Gauge: 22     Additional Needles:   Procedures:, nerve stimulator,,, ultrasound used (permanent image in chart),,,,  Narrative:  Start time: 09/13/2019 9:08 AM End time: 09/13/2019 9:17 AM Injection made incrementally with aspirations every 5 mL.  Performed by: Personally  Anesthesiologist: Janeece Riggers, MD  Additional Notes: Functioning IV was confirmed and monitors were applied.  A 7mm 22ga Arrow echogenic stimulator needle was used. Sterile prep and drape,hand hygiene and sterile gloves were used. Ultrasound guidance: relevant anatomy identified, needle position confirmed, local anesthetic spread visualized around nerve(s)., vascular puncture avoided.  Image printed for medical record. Negative aspiration and negative test dose prior to incremental administration of local anesthetic. The patient tolerated the procedure well.

## 2019-09-13 NOTE — Transfer of Care (Signed)
Immediate Anesthesia Transfer of Care Note  Patient: Paula Gilbert  Procedure(s) Performed: LAPAROTOMY WITH Right  OVARIAN CYSTECTOMY (N/A Abdomen) Left OOPHORECTOMY AND SALPINGECTOMY (Bilateral Abdomen)  Patient Location: PACU  Anesthesia Type:GA combined with regional for post-op pain  Level of Consciousness: drowsy  Airway & Oxygen Therapy: Patient Spontanous Breathing and Patient connected to nasal cannula oxygen  Post-op Assessment: Report given to RN and Post -op Vital signs reviewed and stable  Post vital signs: Reviewed and stable  Last Vitals:  Vitals Value Taken Time  BP 139/84 09/13/19 1112  Temp    Pulse 61 09/13/19 1114  Resp 15 09/13/19 1114  SpO2 100 % 09/13/19 1114  Vitals shown include unvalidated device data.  Last Pain:  Vitals:   09/13/19 0747  TempSrc: Oral         Complications: No apparent anesthesia complications

## 2019-09-13 NOTE — Op Note (Signed)
PROCEDURE DATE:09/13/2019  PREOPERATIVE DIAGNOSIS:  Left ovarian mass  POSTOPERATIVE DIAGNOSIS:  The same plus right ovarian mass SURGEON:   Arlina Robes, M.D. ASSISTANT: Verita Schneiders, M.D. An experienced assistant was required given the standard of surgical care given the complexity of the case.  This assistant was needed for exposure, dissection, suctioning, retraction, and for overall help during the procedure.   ANESTHESIOLOGIST: As recorded OPERATION:  Exploratory laparotomy,  Left Salpingophorrectomy and right ovarian cystectomy ANESTHESIA:  General endotracheal.  INDICATIONS: The patient is a 29 y.o. Paula Gilbert:[id number] with  Persistent left ovarian mass who desired definitive surgical management. On the preoperative visit, the risks, benefits, indications, and alternatives of the procedure were reviewed with the patient.  On the day of surgery, the risks of surgery were again discussed with the patient including but not limited to: bleeding which may require transfusion or reoperation; infection which may require antibiotics; injury to bowel, bladder, ureters or other surrounding organs; need for additional procedures; thromboembolic phenomenon, incisional problems and other postoperative/anesthesia complications. Written informed consent was obtained.    OPERATIVE FINDINGS: A 8 week size uterus with large left ovarian mass aprox 15 x 11 x 8 cm, appeared to be dermoid, and enlarged right ovary approx 7 x 7 x7 cm with multiple dermoid cysts. Right tube appeared normal  No other anomalies noted in pelvis, no lesions of endometriosis.    ESTIMATED BLOOD LOSS: 25 ml FLUIDS:  As recorded URINE OUTPUT:  As recorded SPECIMENS:  Left fallopian tube and ovary plus right ovarian cysts sent to pathology COMPLICATIONS:  None immediate.  DESCRIPTION OF PROCEDURE:  The patient received intravenous antibiotics and had sequential compression devices applied to her lower extremities while in the preoperative  area.   She was taken to the operating room and placed under general anesthesia without difficulty.The abdomen and perineum were prepped and draped in a sterile manner, and she was placed in a dorsal supine position.  A Foley catheter was inserted into the bladder and attached to constant drainage. After an adequate timeout was performed, a Pfannensteil skin incision was made. This incision was taken down to the fascia using electrocautery with care given to maintain good hemostasis. The fascia was incised in the midline and the fascial incision was then extended bilaterally using electrocautery without difficulty. The fascia was then dissected off the underlying rectus muscles using blunt and sharp dissection. The rectus muscles were split bluntly in the midline and the peritoneum entered sharply without complication. This peritoneal incision was then extended superiorly and inferiorly with care given to prevent bowel or bladder injury.  Attention was then turned to the pelvis. The bowel was packed away with moist laparotomy sponges. The  ovary at this point was noted to be mobilized and was delivered up out of the abdomen.  The left  infundibulopelvic ligament clamped on the patient's right side. This pedicle was then clamped, cut, and doubly suture ligated with 0 Vicryl.  Inspection of the right ovarian reviewed the above findings. The right ovary was elevated out of the pelvis and wrapped with wet laps. A superficial incision was made over two of the cysts. The cyst was then sharply and bluntly dissected away from the ovarian tissue.  During this dissection one cyst rupture expression hair and sebaceous material consistent with a dermoid. Dissection was continued until all cysts were removed. The ovarian stroma was then closed with 3/0 Vircyl in a purse string fashion starting at the base of the dissected area. A 2 x  2 cm piece of surgericel was placed inside the ovary for additional hemostasis. The remainder of  the ovarian stroma was closed to the top of the dissected area. The ovarian stroma edge was then closed with 3/0 Vicryl in baseball fashion. Hemostasis was noted.   The pelvis was irrigated with 2 liters  and hemostasis was reconfirmed. The right ovary was wrapped with surgericel.   All laparotomy sponges and instruments were removed from the abdomen. The peritoneum and abd musles was closed with a 2-0 Vicryl running stitch. The fascia was  closed in a running fashion with 0 Vicryl suture. The subcutaneous layer was closed with plain gut. The skin was closed with a 4-0 Vicryl subcuticular stitch. Sponge, lap, needle, and instrument counts were correct times two. The patient was taken to the recovery area awake, extubated and in stable condition.  Arlina Robes, MD, [place] Attending [place], [facility]

## 2019-09-13 NOTE — Anesthesia Postprocedure Evaluation (Signed)
Anesthesia Post Note  Patient: Dawanda Steinruck  Procedure(s) Performed: LAPAROTOMY WITH Right  OVARIAN CYSTECTOMY (N/A Abdomen) Left OOPHORECTOMY AND SALPINGECTOMY (Bilateral Abdomen)     Patient location during evaluation: PACU Anesthesia Type: General Level of consciousness: awake and alert Pain management: pain level controlled Vital Signs Assessment: post-procedure vital signs reviewed and stable Respiratory status: spontaneous breathing, nonlabored ventilation, respiratory function stable and patient connected to nasal cannula oxygen Cardiovascular status: blood pressure returned to baseline and stable Postop Assessment: no apparent nausea or vomiting Anesthetic complications: no    Last Vitals:  Vitals:   09/13/19 1244 09/13/19 1305  BP: 122/90 (!) 124/94  Pulse: 66 63  Resp: 15 18  Temp: 36.6 C 36.8 C  SpO2: 98% 100%    Last Pain:  Vitals:   09/13/19 1327  TempSrc:   PainSc: 7                  Syvilla Martin

## 2019-09-13 NOTE — Interval H&P Note (Signed)
History and Physical Interval Note:  09/13/2019 8:42 AM  Paula Gilbert  has presented today for surgery, with the diagnosis of Ovarian Mass.  The various methods of treatment have been discussed with the patient and family. After consideration of risks, benefits and other options for treatment, the patient has consented to  Procedure(s): LAPAROTOMY WITH OVARIAN CYSTECTOMY (N/A) POSSIBLE OOPHORECTOMY AND SALPINGECTOMY (Bilateral) as a surgical intervention.  The patient's history has been reviewed, patient examined, no change in status, stable for surgery.  I have reviewed the patient's chart and labs.  Questions were answered to the patient's satisfaction.     [name]

## 2019-09-14 DIAGNOSIS — Z48816 Encounter for surgical aftercare following surgery on the genitourinary system: Secondary | ICD-10-CM

## 2019-09-14 LAB — CBC
HCT: 37.3 % (ref 36.0–46.0)
Hemoglobin: 12.6 g/dL (ref 12.0–15.0)
MCH: 30.5 pg (ref 26.0–34.0)
MCHC: 33.8 g/dL (ref 30.0–36.0)
MCV: 90.3 fL (ref 80.0–100.0)
Platelets: 296 10*3/uL (ref 150–400)
RBC: 4.13 MIL/uL (ref 3.87–5.11)
RDW: 13.5 % (ref 11.5–15.5)
WBC: 15.2 10*3/uL — ABNORMAL HIGH (ref 4.0–10.5)
nRBC: 0 % (ref 0.0–0.2)

## 2019-09-14 LAB — BASIC METABOLIC PANEL
Anion gap: 7 (ref 5–15)
BUN: <5 mg/dL — ABNORMAL LOW (ref 6–20)
CO2: 23 mmol/L (ref 22–32)
Calcium: 8.6 mg/dL — ABNORMAL LOW (ref 8.9–10.3)
Chloride: 109 mmol/L (ref 98–111)
Creatinine, Ser: 0.65 mg/dL (ref 0.44–1.00)
GFR calc Af Amer: >60 mL/min (ref >60)
GFR calc non Af Amer: >60 mL/min (ref >60)
Glucose, Bld: 101 mg/dL — ABNORMAL HIGH (ref 70–99)
Potassium: 3.7 mmol/L (ref 3.5–5.1)
Sodium: 139 mmol/L (ref 135–145)

## 2019-09-14 LAB — SURGICAL PATHOLOGY

## 2019-09-14 NOTE — Progress Notes (Signed)
1 Day Post-Op Procedure(s) (LRB): LAPAROTOMY WITH Right  OVARIAN CYSTECTOMY (N/A) Left OOPHORECTOMY AND SALPINGECTOMY (Bilateral)  Subjective: Paula Gilbert reports some pain this morning, but pain medication helps. Tolerating diet.  Objective: AF VSS.  Lungs clear Heart RRR Abd soft + BS, drsg intact Ext non tender  Assessment: s/p Procedure(s): LAPAROTOMY WITH Right  OVARIAN CYSTECTOMY (N/A) Left OOPHORECTOMY AND SALPINGECTOMY (Bilateral):   Plan: Surgerical findings reviewed with pt. D/C iv fluids and foley. Ambulate and shower. Continue with progressive care Plan for discharge home tomorrow  LOS: 1 day    [name] 09/14/2019, 8:33 AM

## 2019-09-15 MED ORDER — OXYCODONE-ACETAMINOPHEN 5-325 MG PO TABS: 1.0000 | ORAL_TABLET | ORAL | 0 refills | Status: DC | PRN

## 2019-09-15 MED ORDER — IBUPROFEN 800 MG PO TABS: 800.0000 mg | ORAL_TABLET | Freq: Three times a day (TID) | ORAL | 1 refills | Status: DC

## 2019-09-15 NOTE — Discharge Summary (Signed)
Physician Discharge Summary  Patient ID: Paula Gilbert MRN: [id number] DOB/AGE: [birth year] 29 y.o.  Admit date: 09/13/2019 Discharge date: 09/15/2019  Admission Diagnoses: Persistent left adnexal mass  Discharge Diagnoses:  Active Problems:   Ovarian mass   Post-operative state   Discharged Condition: good  Hospital Course: Paula Gilbert was admitted with above Dx. She underwent Exp Lap with LSO and right ovarian cystectomy. See OP note for additional information. Pt's post op course was unremarkable. She progressed to ambulating, voiding, tolerating diet, passing flatus and good oral pain control. She felt amendable for discharge home. Discharge medications and instructions were reviewed with pt. She verbalized understanding  Consults: NA  Significant Diagnostic Studies: labs  Treatments: surgery: Exp Lap with LSO and right ovarian cystectomy  Discharge Exam: Blood pressure 129/88, pulse (!) 59, temperature 98.1 F (36.7 C), temperature source Oral, resp. rate 18, height 5\' 7"  (1.702 m), weight 63.5 kg, SpO2 100 %, currently breastfeeding.  Lungs clear Heart RRR Abd soft + BS drsg intact GU no bleeding Ext non tender     Disposition: Discharge disposition: 01-Home or Self Care       Discharge Instructions    Call MD for:  difficulty breathing, headache or visual disturbances   Complete by: As directed    Call MD for:  extreme fatigue   Complete by: As directed    Call MD for:  hives   Complete by: As directed    Call MD for:  persistant dizziness or light-headedness   Complete by: As directed    Call MD for:  persistant nausea and vomiting   Complete by: As directed    Call MD for:  redness, tenderness, or signs of infection (pain, swelling, redness, odor or green/yellow discharge around incision site)   Complete by: As directed    Call MD for:  severe uncontrolled pain   Complete by: As directed    Call MD for:  temperature >100.4   Complete by: As  directed    Diet - low sodium heart healthy   Complete by: As directed    Discharge wound care:   Complete by: As directed    Remove dressing May 18   Increase activity slowly   Complete by: As directed    Sexual Activity Restrictions   Complete by: As directed    Pelvic rest x 4 weeks     Allergies as of 09/15/2019   No Known Allergies     Medication List    STOP taking these medications   medroxyPROGESTERone 150 MG/ML injection Commonly known as: DEPO-PROVERA   NIFEdipine 30 MG 24 hr tablet Commonly known as: ADALAT CC     TAKE these medications   ibuprofen 800 MG tablet Commonly known as: ADVIL Take 1 tablet (800 mg total) by mouth 3 (three) times daily.   oxyCODONE-acetaminophen 5-325 MG tablet Commonly known as: PERCOCET/ROXICET Take 1 tablet by mouth every 4 (four) hours as needed for moderate pain.            Discharge Care Instructions  (From admission, onward)         Start     Ordered   09/15/19 0000  Discharge wound care:    Comments: Remove dressing May 18   09/15/19 0918         Follow-up Information    [facility]. Schedule an appointment as soon as possible for a visit in 4 week(s).   Specialty: Obstetrics and Gynecology Why: Post op appt  with Dr. Gardiner Fanti Contact information: [place] [phone]          Signed: [name] 09/15/2019, 9:20 AM

## 2019-09-15 NOTE — Discharge Instructions (Signed)
Exploratory Laparotomy, Adult, Care After °This sheet gives you information about how to care for yourself after your procedure. Your health care provider may also give you more specific instructions. If you have problems or questions, contact your health care provider. °What can I expect after the procedure? °After the procedure, it is common to have: °· Abdominal soreness. °· Fatigue. °· A sore throat from the tube in your throat. °· A lack of appetite. °Follow these instructions at home: °Medicines °· Take over-the-counter and prescription medicines only as told by your health care provider. °· If you were prescribed an antibiotic medicine, take it as told by your health care provider. Do not stop taking the antibiotic even if you start to feel better. °· Do not drive or operate heavy machinery while taking pain medicine. °· If you are taking prescription pain medicine, take actions to prevent or treat constipation. Your health care provider may recommend that you: °? Drink enough fluid to keep your urine pale yellow. °? Eat foods that are high in fiber, such as fresh fruits and vegetables, whole grains, and beans. °? Limit foods that are high in fat and processed sugars, such as fried or sweet foods. °? Take an over-the-counter or prescription medicine for constipation. Undergoing surgery and taking pain medicines can make constipation worse. °Incision care ° °· Follow instructions from your health care provider about how to take care of your incision. Make sure you: °? Wash your hands with soap and water before you change your bandage (dressing). If soap and water are not available, use hand sanitizer. °? Change your dressing as told by your health care provider. °? Leave stitches (sutures), skin glue, or adhesive strips in place. These skin closures may need to stay in place for 2 weeks or longer. If adhesive strip edges start to loosen and curl up, you may trim the loose edges. Do not remove adhesive strips  completely unless your health care provider tells you to do that. °· If you were sent home with a drain, follow instructions from your health care provider about how to care for it. °· Check your incision area every day for signs of infection. Check for: °? Redness, swelling, or pain. °? Fluid or blood. °? Warmth. °? Pus or a bad smell. °Activity ° °· Rest as told by your health care provider. °? Avoid sitting for a long time without moving. Get up to take short walks every 1-2 hours. This is important to improve blood flow and breathing. Ask for help if you feel weak or unsteady. °· Do not lift anything that is heavier than 5 lb (2.2 kg), or the limit that your health care provider tells you, until he or she says that it is safe. °· Ask your health care provider when you can start to do your usual activities again, such as driving, going back to work, and having sex. °Eating and drinking °· You may eat what you usually eat. Include lots of whole grains, fruits, and vegetables in your diet. This will help to prevent constipation. °· Drink enough fluid to keep your urine pale yellow. °Bathing °· Keep your incision clean and dry. Clean it as often as told by your health care provider: °? Gently wash the incision with soap and water. °? Rinse the incision with water to remove all soap. °? Pat the incision dry with a clean towel. Do not rub the incision. °· You may take showers after 48 hours. °· Do not take baths,   swim, or use a hot tub until your health care provider says it is okay to do so. °General instructions °· Do not use any products that contain nicotine or tobacco, such as cigarettes and e-cigarettes. These can delay healing after surgery. If you need help quitting, ask your health care provider. °· Wear compression stockings as told by your health care provider. These stockings help to prevent blood clots and reduce swelling in your legs. °· Keep all follow-up visits as told by your health care provider.  This is important. °Contact a health care provider if: °· You have a fever. °· You have chills. °· Your pain medicine is not helping. °· You have constipation or diarrhea. °· You have nausea or vomiting. °· You have drainage, redness, swelling, or pain at your incision site. °Get help right away if: °· Your pain is getting worse. °· You have not had a bowel movement for more than 3 days. °· You have ongoing (persistent) vomiting. °· The edges of your incision open up. °· You have warmth, tenderness, and swelling in your calf. °· You have trouble breathing. °· You have chest pain. °These symptoms may represent a serious problem that is an emergency. Do not wait to see if the symptoms will go away. Get medical help right away. Call your local emergency services (911 in the [place]). Do not drive yourself to the hospital. °Summary °· Abdominal soreness is common after exploratory laparotomy. Take over-the-counter and prescription pain medicines only as told by your health care provider. °· Follow instructions from your health care provider about how to take care of your incision. Do not take baths, swim, or use a hot tub until your health care provider says it is okay to do so. °· Watch for signs and symptoms of infection after surgery, including fever, chills, drainage from your incision, and worsening abdominal pain. °This information is not intended to replace advice given to you by your health care provider. Make sure you discuss any questions you have with your health care provider. °Document Revised: 06/14/2018 Document Reviewed: 05/01/2017 °Elsevier Patient Education © 2020 [employer] ° °

## 2019-09-15 NOTE — Progress Notes (Signed)
NURSING PROGRESS NOTE  Tylan Dipippo [id number] Discharge Data: 09/15/2019 1:14 PM Attending Provider: [name], MD [id number], Provider, MD     [name] to be D/C'd Home per MD order.  Discussed with the patient the After Visit Summary and all questions fully answered. All IV's discontinued with no bleeding noted. All belongings returned to patient for patient to take home.   Last Vital Signs:  Blood pressure 129/88, pulse (!) 59, temperature 98.1 F (36.7 C), temperature source Oral, resp. rate 18, height 5\' 7"  (1.702 m), weight 63.5 kg, SpO2 100 %, currently breastfeeding.  Discharge Medication List Allergies as of 09/15/2019   No Known Allergies     Medication List    STOP taking these medications   medroxyPROGESTERone 150 MG/ML injection Commonly known as: DEPO-PROVERA   NIFEdipine 30 MG 24 hr tablet Commonly known as: ADALAT CC     TAKE these medications   ibuprofen 800 MG tablet Commonly known as: ADVIL Take 1 tablet (800 mg total) by mouth 3 (three) times daily.   oxyCODONE-acetaminophen 5-325 MG tablet Commonly known as: PERCOCET/ROXICET Take 1 tablet by mouth every 4 (four) hours as needed for moderate pain.            Discharge Care Instructions  (From admission, onward)         Start     Ordered   09/15/19 0000  Discharge wound care:    Comments: Remove dressing May 18   09/15/19 [id number]

## 2019-09-20 ENCOUNTER — Encounter (HOSPITAL_COMMUNITY): Payer: Self-pay

## 2019-09-20 ENCOUNTER — Emergency Department (HOSPITAL_COMMUNITY)
Admission: EM | Admit: 2019-09-20 | Discharge: 2019-09-20 | Disposition: A | Discharge status: Home / Self Care | Payer: Medicaid Other | Attending: Emergency Medicine | Admitting: Emergency Medicine

## 2019-09-20 ENCOUNTER — Other Ambulatory Visit: Payer: Self-pay

## 2019-09-20 DIAGNOSIS — Y69 Unspecified misadventure during surgical and medical care: Secondary | ICD-10-CM | POA: Diagnosis not present

## 2019-09-20 DIAGNOSIS — Z5321 Procedure and treatment not carried out due to patient leaving prior to being seen by health care provider: Secondary | ICD-10-CM | POA: Insufficient documentation

## 2019-09-20 DIAGNOSIS — T8131XA Disruption of external operation (surgical) wound, not elsewhere classified, initial encounter: Secondary | ICD-10-CM | POA: Diagnosis not present

## 2019-09-20 NOTE — ED Notes (Signed)
Pt called multiple times by registration and clinical staff, no answer

## 2019-09-20 NOTE — ED Triage Notes (Signed)
Pt arrives to ED w/ c/o open incision. Pt had partial hysterectomy one week ago.

## 2019-09-21 ENCOUNTER — Ambulatory Visit (HOSPITAL_COMMUNITY)
Admission: EM | Admit: 2019-09-21 | Discharge: 2019-09-21 | Disposition: A | Discharge status: Home / Self Care | Payer: Medicaid Other

## 2019-09-21 ENCOUNTER — Other Ambulatory Visit: Payer: Self-pay

## 2019-09-21 ENCOUNTER — Encounter (HOSPITAL_COMMUNITY): Payer: Self-pay

## 2019-09-21 DIAGNOSIS — Z4801 Encounter for change or removal of surgical wound dressing: Secondary | ICD-10-CM | POA: Diagnosis not present

## 2019-09-21 DIAGNOSIS — Z5189 Encounter for other specified aftercare: Secondary | ICD-10-CM | POA: Diagnosis not present

## 2019-09-21 NOTE — ED Triage Notes (Signed)
Pt states she had laporotomy approx 8 days ago and was instructed to take honeycomb/steri-strips off yesterday. Upon attempting to remove them, one steri-strip remained partially adhered and skin under it is not fully closed. Wound appears well healed with only superficial wound bed apparent under steri-strip. Pt denies fever, chills, n/v/d, or abdom pain.

## 2019-09-21 NOTE — Discharge Instructions (Signed)
Keep the steri-strip on until it falls off  Call your Ob/GYN surgeon to let them know you had a steri-strip replaced today.  If you see spreading redness, discharge, pelvic pain or fevers, please return or call your Ob/gyn for evaluation. After hours you should report to the Emergency Department
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
## 2019-10-04 ENCOUNTER — Encounter: Payer: Self-pay | Admitting: *Deleted

## 2019-10-17 ENCOUNTER — Encounter: Payer: Self-pay | Admitting: Obstetrics and Gynecology

## 2019-10-17 ENCOUNTER — Ambulatory Visit (INDEPENDENT_AMBULATORY_CARE_PROVIDER_SITE_OTHER): Payer: Medicaid Other | Admitting: Obstetrics and Gynecology

## 2019-10-17 ENCOUNTER — Other Ambulatory Visit: Payer: Self-pay

## 2019-10-17 DIAGNOSIS — Z9889 Other specified postprocedural states: Secondary | ICD-10-CM

## 2019-10-17 DIAGNOSIS — Z3042 Encounter for surveillance of injectable contraceptive: Secondary | ICD-10-CM

## 2019-10-17 DIAGNOSIS — Z309 Encounter for contraceptive management, unspecified: Secondary | ICD-10-CM | POA: Insufficient documentation

## 2019-10-17 LAB — POCT PREGNANCY, URINE: Preg Test, Ur: NEGATIVE

## 2019-10-17 MED ORDER — MEDROXYPROGESTERONE ACETATE 150 MG/ML IM SUSP
150.0000 mg | Freq: Once | INTRAMUSCULAR | Status: AC
  Administered 2019-10-17: 150 mg via INTRAMUSCULAR

## 2019-10-17 NOTE — Patient Instructions (Signed)
Health Maintenance, Female Adopting a healthy lifestyle and getting preventive care are important in promoting health and wellness. Ask your health care provider about:  The right schedule for you to have regular tests and exams.  Things you can do on your own to prevent diseases and keep yourself healthy. What should I know about diet, weight, and exercise? Eat a healthy diet   Eat a diet that includes plenty of vegetables, fruits, low-fat dairy products, and lean protein.  Do not eat a lot of foods that are high in solid fats, added sugars, or sodium. Maintain a healthy weight Body mass index (BMI) is used to identify weight problems. It estimates body fat based on height and weight. Your health care provider can help determine your BMI and help you achieve or maintain a healthy weight. Get regular exercise Get regular exercise. This is one of the most important things you can do for your health. Most adults should:  Exercise for at least 150 minutes each week. The exercise should increase your heart rate and make you sweat (moderate-intensity exercise).  Do strengthening exercises at least twice a week. This is in addition to the moderate-intensity exercise.  Spend less time sitting. Even light physical activity can be beneficial. Watch cholesterol and blood lipids Have your blood tested for lipids and cholesterol at 29 years of age, then have this test every 5 years. Have your cholesterol levels checked more often if:  Your lipid or cholesterol levels are high.  You are older than 29 years of age.  You are at high risk for heart disease. What should I know about cancer screening? Depending on your health history and family history, you may need to have cancer screening at various ages. This may include screening for:  Breast cancer.  Cervical cancer.  Colorectal cancer.  Skin cancer.  Lung cancer. What should I know about heart disease, diabetes, and high blood  pressure? Blood pressure and heart disease  High blood pressure causes heart disease and increases the risk of stroke. This is more likely to develop in people who have high blood pressure readings, are of African descent, or are overweight.  Have your blood pressure checked: ? Every 3-5 years if you are 18-39 years of age. ? Every year if you are 40 years old or older. Diabetes Have regular diabetes screenings. This checks your fasting blood sugar level. Have the screening done:  Once every three years after age 40 if you are at a normal weight and have a low risk for diabetes.  More often and at a younger age if you are overweight or have a high risk for diabetes. What should I know about preventing infection? Hepatitis B If you have a higher risk for hepatitis B, you should be screened for this virus. Talk with your health care provider to find out if you are at risk for hepatitis B infection. Hepatitis C Testing is recommended for:  Everyone born from 1945 through 1965.  Anyone with known risk factors for hepatitis C. Sexually transmitted infections (STIs)  Get screened for STIs, including gonorrhea and chlamydia, if: ? You are sexually active and are younger than 29 years of age. ? You are older than 29 years of age and your health care provider tells you that you are at risk for this type of infection. ? Your sexual activity has changed since you were last screened, and you are at increased risk for chlamydia or gonorrhea. Ask your health care provider if   you are at risk.  Ask your health care provider about whether you are at high risk for HIV. Your health care provider may recommend a prescription medicine to help prevent HIV infection. If you choose to take medicine to prevent HIV, you should first get tested for HIV. You should then be tested every 3 months for as long as you are taking the medicine. Pregnancy  If you are about to stop having your period (premenopausal) and  you may become pregnant, seek counseling before you get pregnant.  Take 400 to 800 micrograms (mcg) of folic acid every day if you become pregnant.  Ask for birth control (contraception) if you want to prevent pregnancy. Osteoporosis and menopause Osteoporosis is a disease in which the bones lose minerals and strength with aging. This can result in bone fractures. If you are 65 years old or older, or if you are at risk for osteoporosis and fractures, ask your health care provider if you should:  Be screened for bone loss.  Take a calcium or vitamin D supplement to lower your risk of fractures.  Be given hormone replacement therapy (HRT) to treat symptoms of menopause. Follow these instructions at home: Lifestyle  Do not use any products that contain nicotine or tobacco, such as cigarettes, e-cigarettes, and chewing tobacco. If you need help quitting, ask your health care provider.  Do not use street drugs.  Do not share needles.  Ask your health care provider for help if you need support or information about quitting drugs. Alcohol use  Do not drink alcohol if: ? Your health care provider tells you not to drink. ? You are pregnant, may be pregnant, or are planning to become pregnant.  If you drink alcohol: ? Limit how much you use to 0-1 drink a day. ? Limit intake if you are breastfeeding.  Be aware of how much alcohol is in your drink. In the [place] one drink equals one 12 oz bottle of beer (355 mL), one 5 oz glass of wine (148 mL), or one 1 oz glass of hard liquor (44 mL). General instructions  Schedule regular health, dental, and eye exams.  Stay current with your vaccines.  Tell your health care provider if: ? You often feel depressed. ? You have ever been abused or do not feel safe at home. Summary  Adopting a healthy lifestyle and getting preventive care are important in promoting health and wellness.  Follow your health care provider's instructions about healthy  diet, exercising, and getting tested or screened for diseases.  Follow your health care provider's instructions on monitoring your cholesterol and blood pressure. This information is not intended to replace advice given to you by your health care provider. Make sure you discuss any questions you have with your health care provider. Document Revised: 04/14/2018 Document Reviewed: 04/14/2018 Elsevier Patient Education  2020 Elsevier Inc.  

## 2019-10-17 NOTE — Progress Notes (Signed)
Patient ID: Paula Gilbert, female   DOB: [birth year], 29 y.o.   MRN: [id number] [name] presents for post op appt.  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 Pathology reviewed with pt. She reports that her cycle has been prolonged since surgery. She also missed her Depo Provera injection 4 weeks ago. She denies any bowel or bladder dysfunction Last IC was 2 weeks ago per pt  PE AF VSS Lungs clear Heart RRR Abd soft + BS incision well healed Ext non tender  A/P Post op visit        Contraception management  Return to nl ADL's as tolerates. UPT today negative. Depo Provera given. F/U q 12 weeks for Depo Provera. O/W F/U PRN

## 2019-10-17 NOTE — Progress Notes (Signed)
Pt states cam on period a week before surgery & still bleeding it never went off.

## 2019-12-09 ENCOUNTER — Ambulatory Visit (HOSPITAL_COMMUNITY)
Admission: RE | Admit: 2019-12-09 | Discharge: 2019-12-09 | Disposition: A | Discharge status: Home / Self Care | Payer: Medicaid Other | Source: Ambulatory Visit | Attending: Internal Medicine | Admitting: Internal Medicine

## 2019-12-09 ENCOUNTER — Other Ambulatory Visit: Payer: Self-pay

## 2019-12-09 ENCOUNTER — Encounter (HOSPITAL_COMMUNITY): Payer: Self-pay

## 2019-12-09 VITALS — BP 130/93 | HR 68 | Temp 97.8°F | Resp 16

## 2019-12-09 DIAGNOSIS — Z202 Contact with and (suspected) exposure to infections with a predominantly sexual mode of transmission: Secondary | ICD-10-CM

## 2019-12-09 DIAGNOSIS — N3001 Acute cystitis with hematuria: Secondary | ICD-10-CM | POA: Insufficient documentation

## 2019-12-09 DIAGNOSIS — R300 Dysuria: Secondary | ICD-10-CM | POA: Diagnosis not present

## 2019-12-09 LAB — POCT URINALYSIS DIPSTICK, ED / UC
Glucose, UA: NEGATIVE mg/dL
Nitrite: NEGATIVE
Protein, ur: 30 mg/dL — AB
Specific Gravity, Urine: >=1.03 (ref 1.005–1.030)
Urobilinogen, UA: 1 mg/dL (ref 0.0–1.0)
pH: 6 (ref 5.0–8.0)

## 2019-12-09 MED ORDER — CEPHALEXIN 500 MG PO CAPS
500.0000 mg | ORAL_CAPSULE | Freq: Two times a day (BID) | ORAL | 0 refills | Status: AC
Start: 2019-12-09 — End: 2019-12-14
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
## 2019-12-09 NOTE — ED Triage Notes (Signed)
Pt c/o "a sensation" after urinating x 2 days. Requesting STD testing.

## 2019-12-11 LAB — CERVICOVAGINAL ANCILLARY ONLY
Chlamydia: NEGATIVE
Comment: NEGATIVE
Comment: NEGATIVE
Comment: NORMAL
Neisseria Gonorrhea: NEGATIVE
Trichomonas: POSITIVE — AB

## 2019-12-11 LAB — URINE CULTURE: Culture: >=100000 — AB

## 2019-12-12 ENCOUNTER — Telehealth (HOSPITAL_COMMUNITY): Payer: Self-pay

## 2019-12-20 MED ORDER — METRONIDAZOLE 500 MG PO TABS: 500.0000 mg | ORAL_TABLET | Freq: Two times a day (BID) | ORAL | 0 refills | Status: DC

## 2019-12-20 NOTE — Telephone Encounter (Signed)
We were waiting to reach patient to verify she is not breast feeding at this time to be able to prescribe flagyl.  Spoke to patient this morning, states she is not breastfeeding, will send in prescription.

## 2020-01-02 ENCOUNTER — Ambulatory Visit: Payer: Medicaid Other

## 2020-02-28 ENCOUNTER — Encounter: Payer: Self-pay | Admitting: Family Medicine

## 2020-02-28 ENCOUNTER — Ambulatory Visit (INDEPENDENT_AMBULATORY_CARE_PROVIDER_SITE_OTHER): Payer: Medicaid Other | Admitting: Family Medicine

## 2020-02-28 ENCOUNTER — Other Ambulatory Visit: Payer: Self-pay

## 2020-02-28 VITALS — BP 119/86 | HR 84 | Wt 135.5 lb

## 2020-02-28 DIAGNOSIS — N939 Abnormal uterine and vaginal bleeding, unspecified: Secondary | ICD-10-CM | POA: Diagnosis not present

## 2020-02-28 MED ORDER — NORGESTIMATE-ETH ESTRADIOL 0.25-35 MG-MCG PO TABS: 1.0000 | ORAL_TABLET | Freq: Every day | ORAL | 3 refills | Status: DC

## 2020-02-28 NOTE — Progress Notes (Signed)
   Subjective:    Patient ID: Paula Gilbert, female    DOB: Nov 17, 1990, 29 y.o.   MRN: [id number]  HPI Patient seen for AUB - delivered via NSVD in February. Started depo, had 2 doses of depo. Had continuous light bleeding or brown discharge. Since august, has had continued bleeding. No cramping or pain.   Review of Systems     Objective:   Physical Exam Vitals reviewed.  Constitutional:      Appearance: Normal appearance.  Abdominal:     General: Abdomen is flat. There is no distension.     Palpations: Abdomen is soft. There is no mass.     Tenderness: There is no abdominal tenderness. There is no guarding or rebound.     Hernia: No hernia is present.  Psychiatric:        Mood and Affect: Mood normal.        Behavior: Behavior normal.        Thought Content: Thought content normal.        Judgment: Judgment normal.       Assessment & Plan:  1. Abnormal uterine bleeding (AUB) Start COCs - this should get the bleeding under control. Discussed that it may take a month or so, but bleeding should gradually decrease. Discussed strategies to ensure not missing tablets. Discussed common side effects. F/u 3 months.

## 2020-11-19 IMAGING — US OBSTETRIC <14 WK US AND TRANSVAGINAL OB US
1 series · 15 of 24 positions shown · non-contrast
Comparison: None.

CLINICAL DATA: Unknown dates

EXAM:
OBSTETRIC <14 WK US AND TRANSVAGINAL OB US
TECHNIQUE: Both transabdominal and transvaginal ultrasound examinations were
performed for complete evaluation of the gestation as well as the
maternal uterus, adnexal regions, and pelvic cul-de-sac.
Transvaginal technique was performed to assess early pregnancy.

[Series 1: obstetric <14 wk us and transvaginal ob us · 15 of 24 slices shown]
[im 1/24]
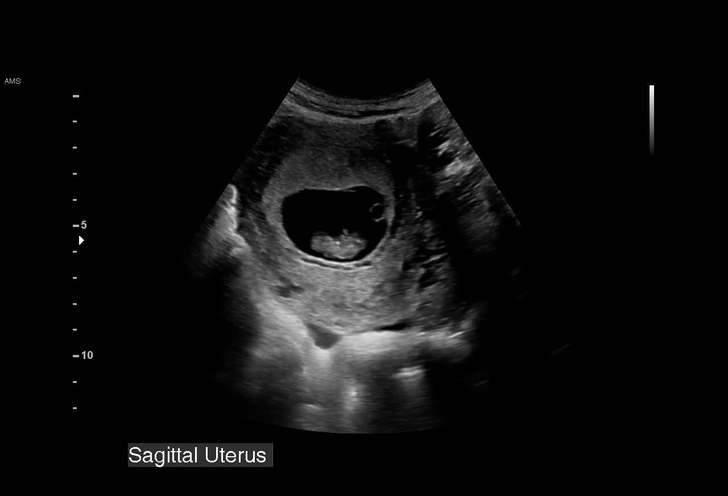
[im 3/24]
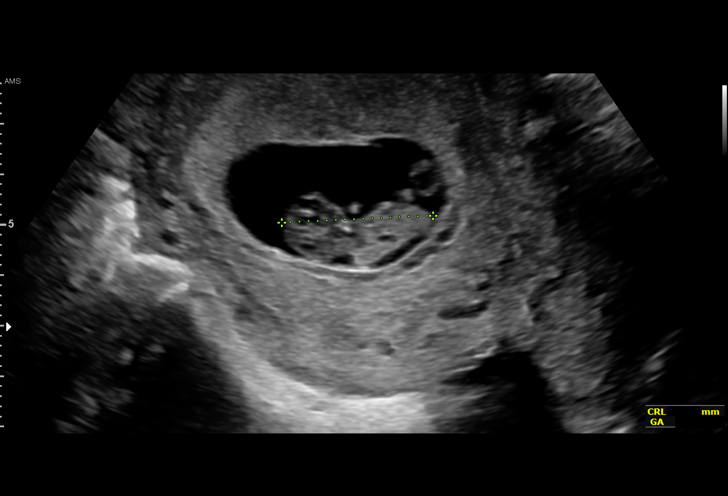
[im 5/24]
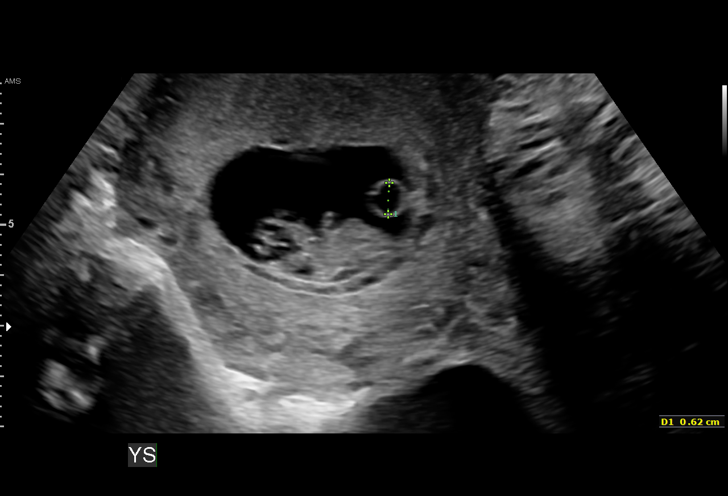
[im 6/24]
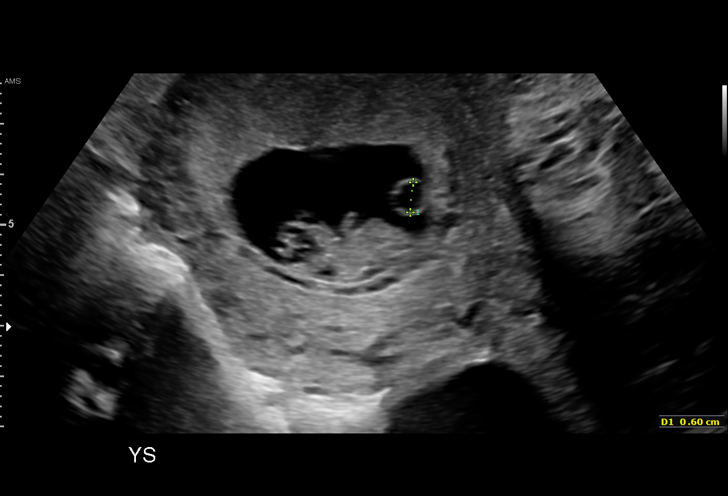
[im 8/24]
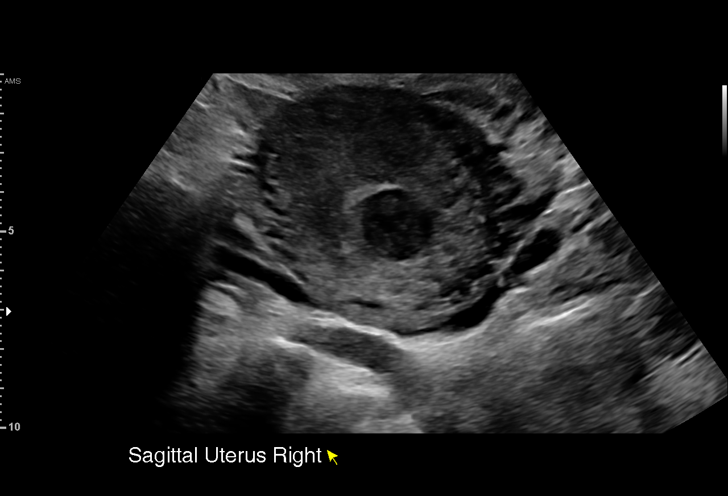
[im 9/24]
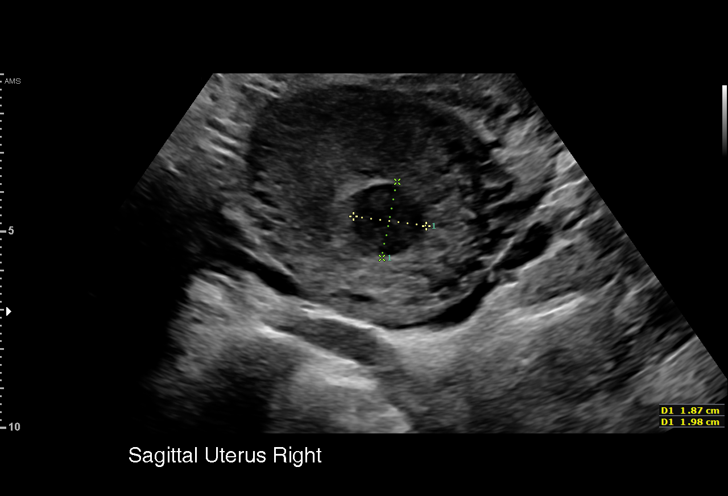
[im 11/24]
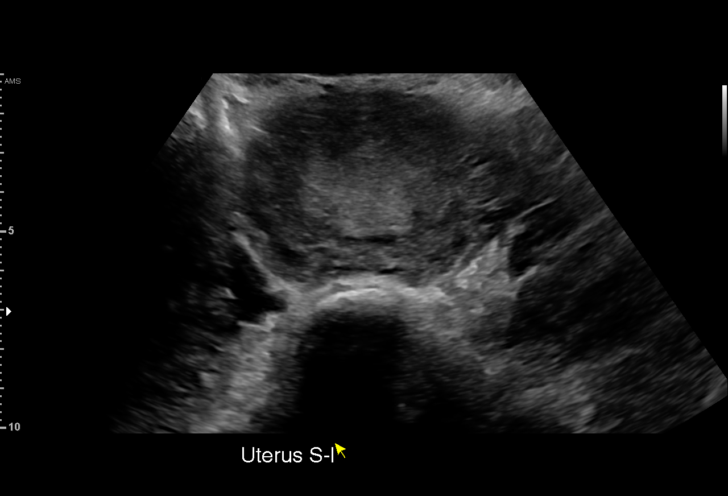
[im 13/24]
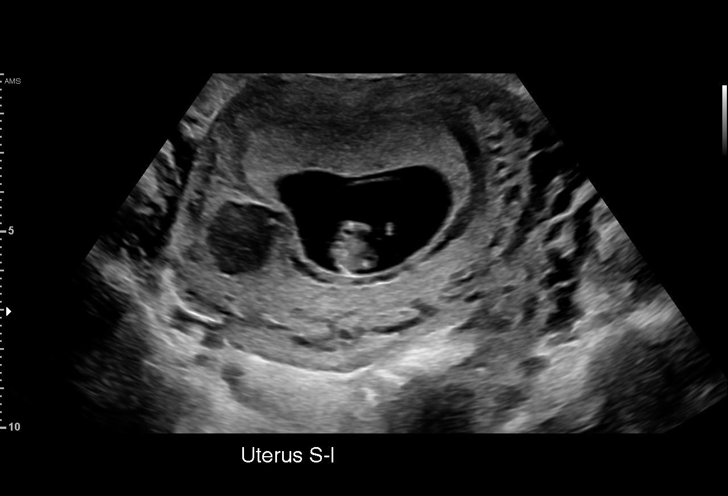
[im 14/24]
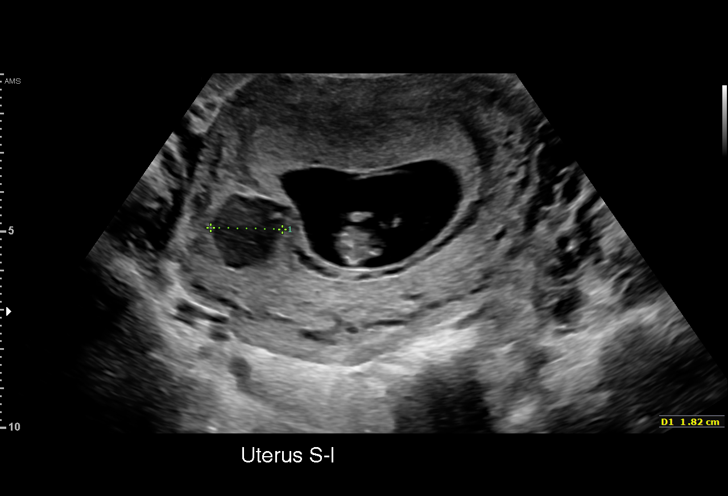
[im 16/24]
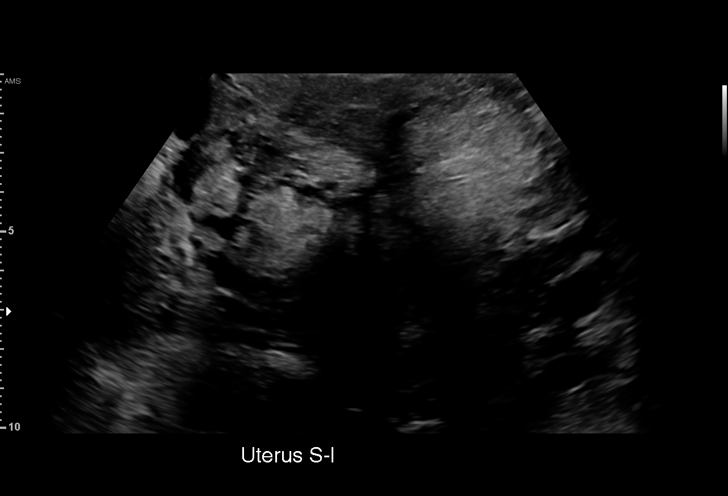
[im 17/24]
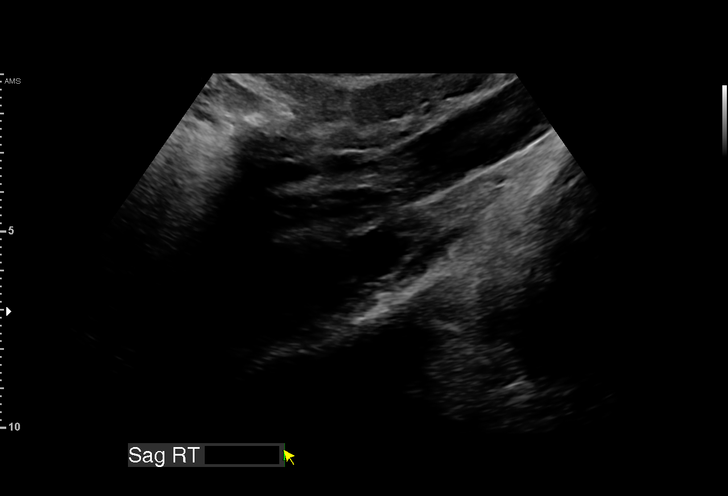
[im 19/24]
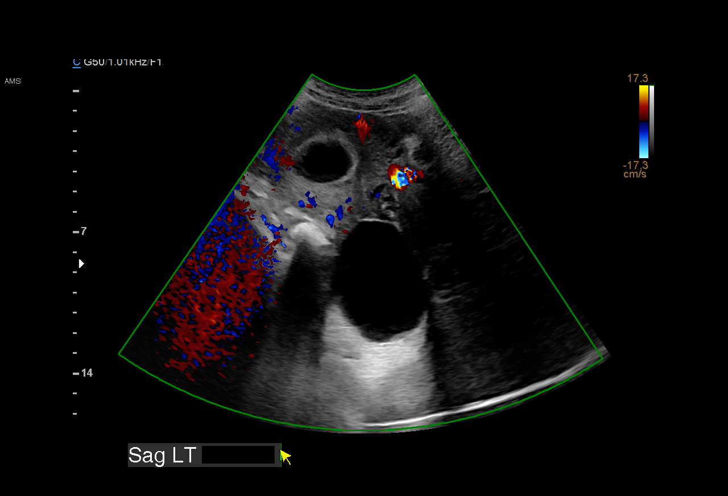
[im 21/24]
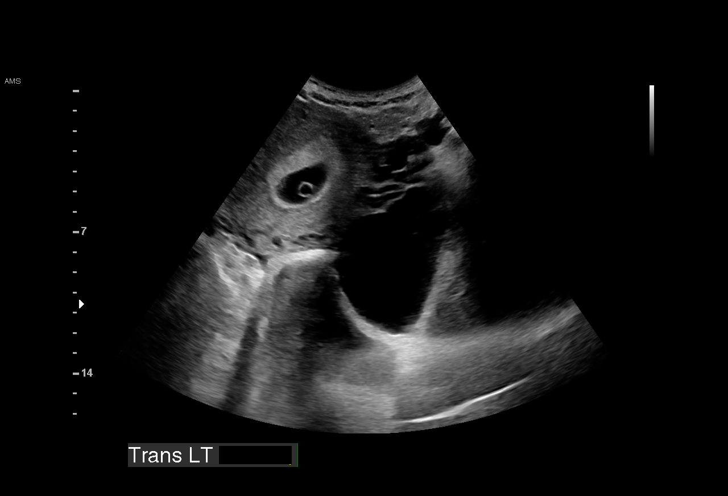
[im 22/24]
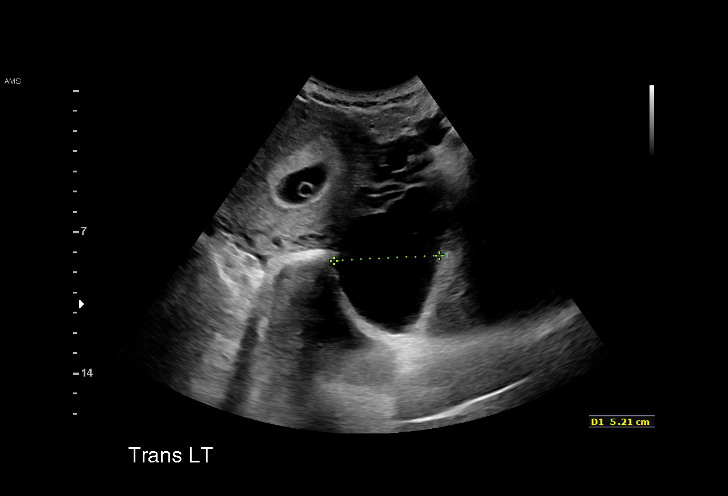
[im 24/24]
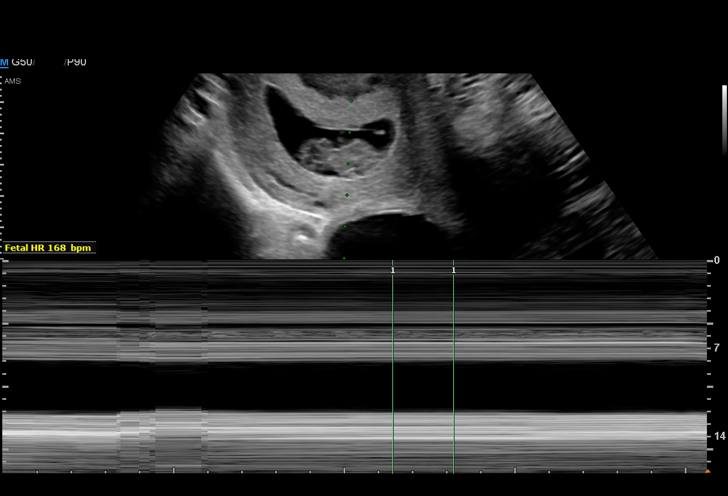

[15 of 24 positions shown; findings below may reference images not displayed]

FINDINGS: Intrauterine gestational sac: Visualized

Yolk sac:  Visualized

Embryo:  Visualized

Cardiac Activity: Visualized

Heart Rate: 168 bpm

CRL:  29 mm   9 w   5 d                  US EDC: February 24, 2019

Subchorionic hemorrhage: Focal apparent subchorionic hemorrhage
measuring 1.9 x 2.0 x 1.8 cm

Maternal uterus/adnexae: Neither ovary appreciable by transabdominal
transvaginal technique. A cyst arising in the left adnexal region
measures 4.8 x 6.2 x 5.2 cm. No appreciable free pelvic fluid.
IMPRESSION: Single live intrauterine gestation with estimated gestational age of
10- weeks. Apparent subchorionic hemorrhage measuring 1.9 x 2.0 x
1.8 cm.

Probable simple cyst in the left adnexal region measuring 4.8 x
x 5.2 cm. Neither ovary visualized on this study. No free pelvic
fluid. Particular attention to this apparent simple cyst advised on
subsequent examinations.

## 2021-03-16 IMAGING — US OBSTETRIC <14 WK US AND TRANSVAGINAL OB US
1 series · 15 of 28 positions shown · non-contrast
Comparison: July 27, 2018.

CLINICAL DATA: Abdominal pain

EXAM:
OBSTETRIC <14 WK US AND TRANSVAGINAL OB US
TECHNIQUE: Both transabdominal and transvaginal ultrasound examinations were
performed for complete evaluation of the gestation as well as the
maternal uterus, adnexal regions, and pelvic cul-de-sac.
Transvaginal technique was performed to assess early pregnancy.

[Series 1: obstetric <14 wk us and transvaginal ob us · 127 acquisitions, 15 frames shown]
[im 1/127]
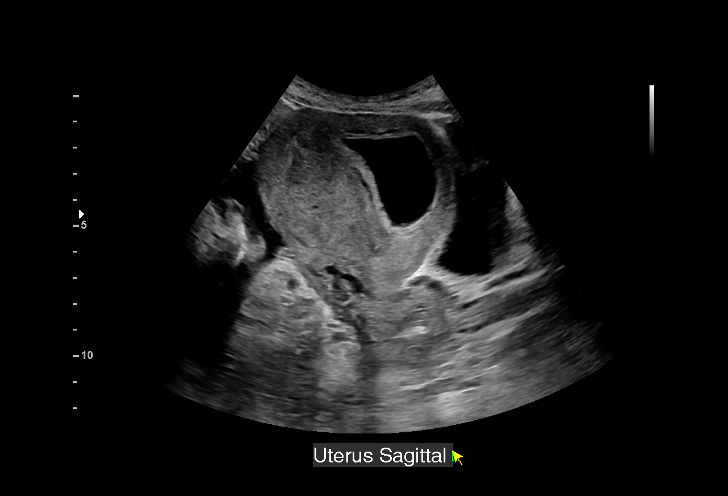
[im 10/127]
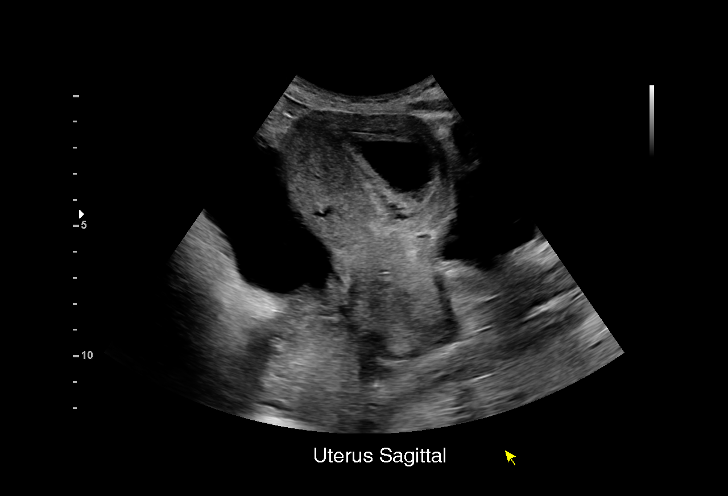
[im 19/127]
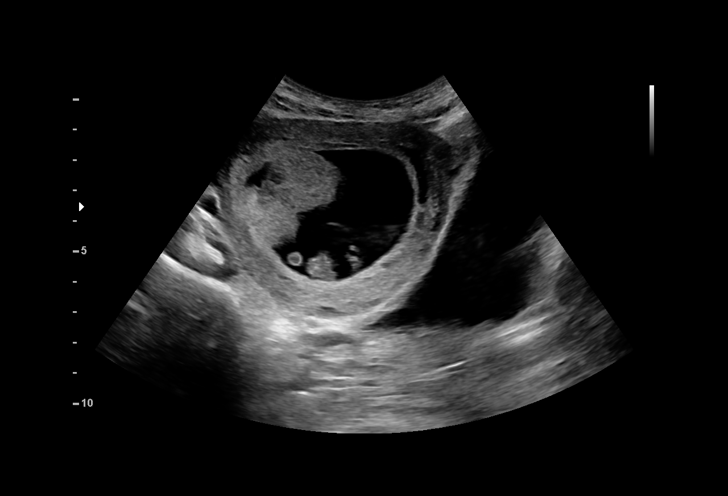
[im 29/127]
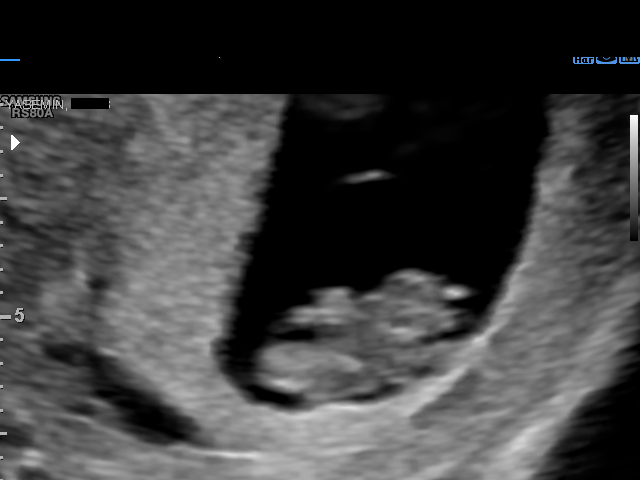
[im 38/127]
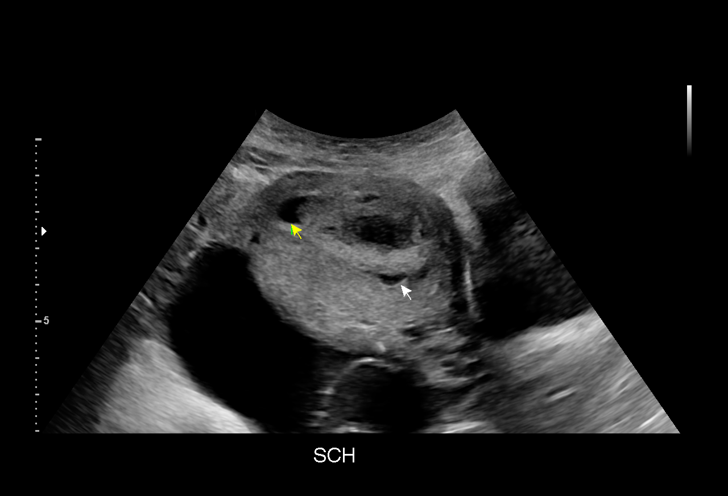
[im 47/127]
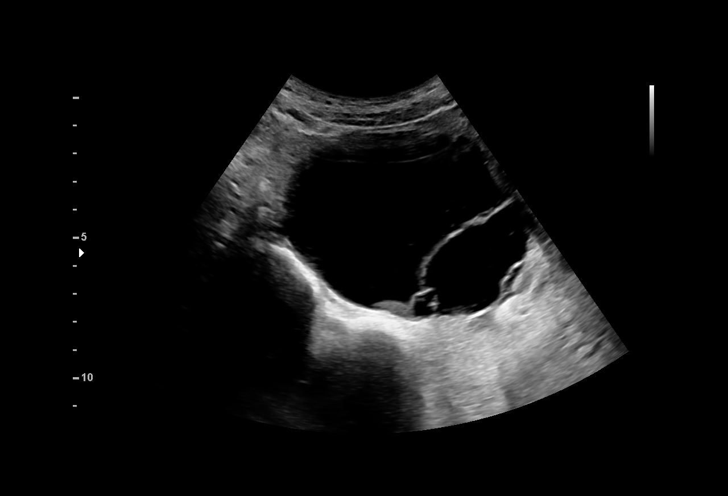
[im 57/127]
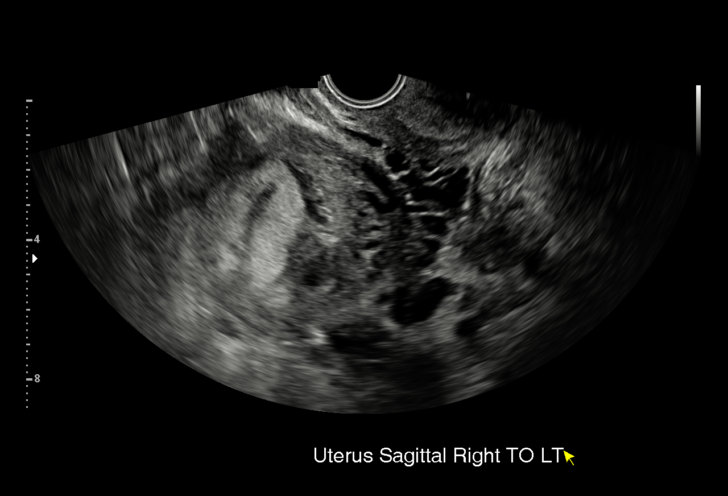
[im 66/127]
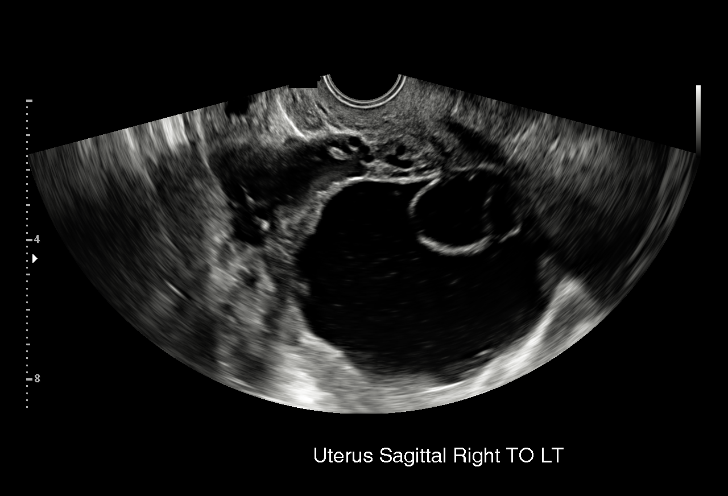
[im 71/127]
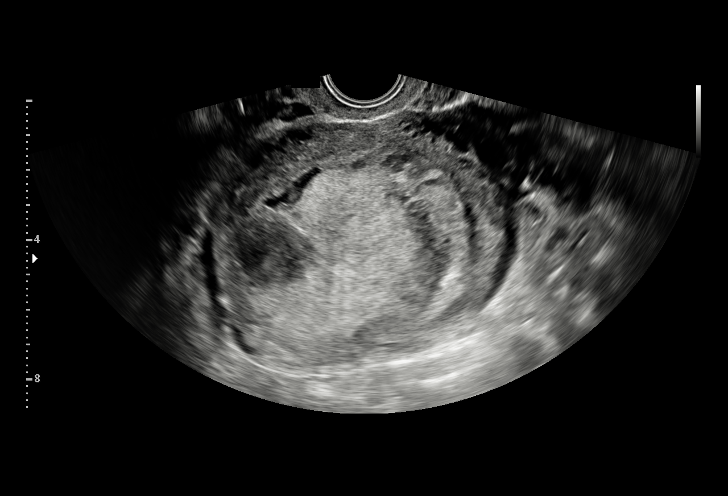
[im 80/127]
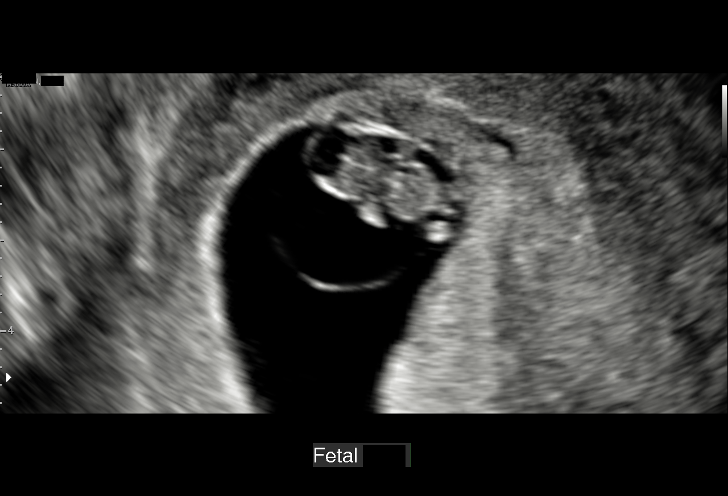
[im 89/127]
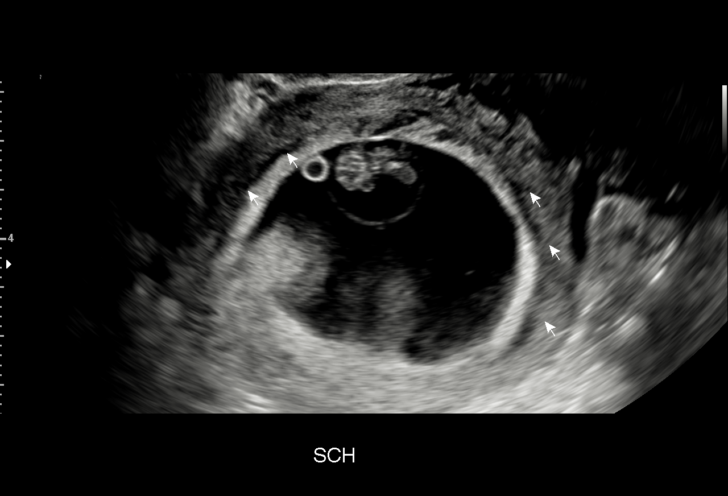
[im 99/127]
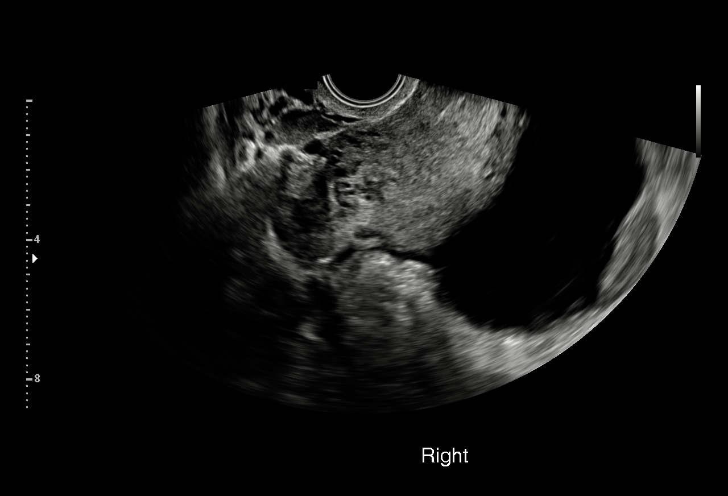
[im 108/127]
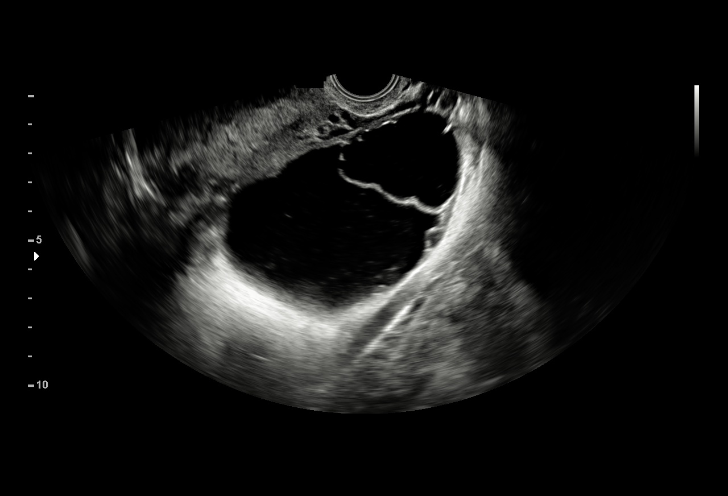
[im 117/127]
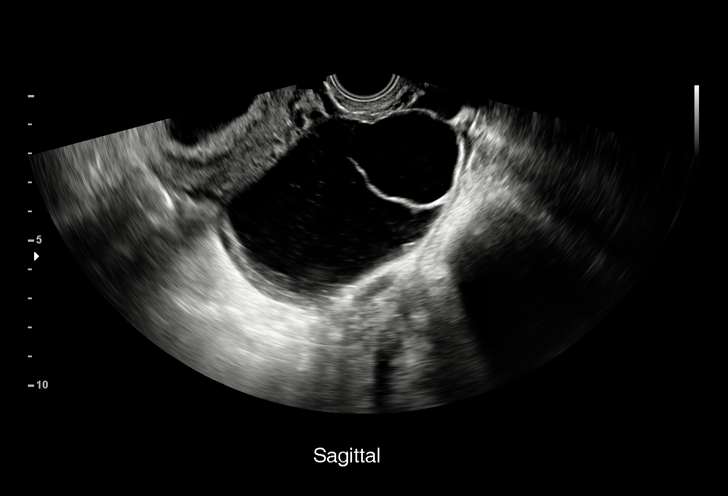
[im 127/127]
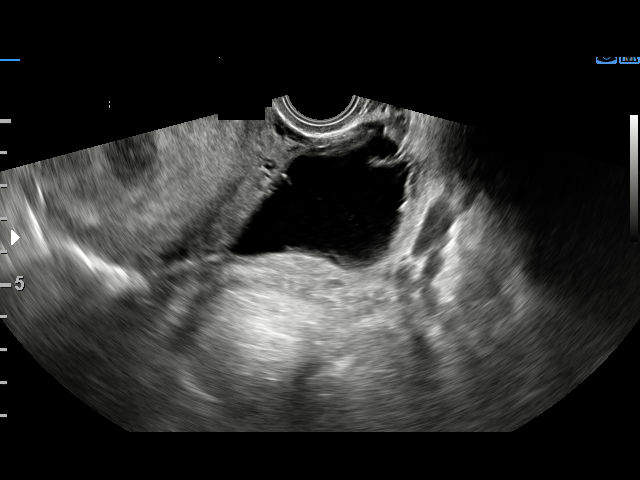

[15 of 28 positions shown; findings below may reference images not displayed]

FINDINGS: Intrauterine gestational sac: Single

Yolk sac:  Visualized.

Embryo:  Visualized.

Cardiac Activity: Visualized.

Heart Rate: 100 setting 7 bpm

CRL:  18.2 mm   8 w   2 d                  US EDC: 07/01/2019

Subchorionic hemorrhage: There is a moderate amount of subchorionic
hemorrhage.

Maternal uterus/adnexae: The right ovary is unremarkable measures
2.6 x 1.7 x 1.6 cm. There is a large left-sided cystic structure
with low level internal echoes and internal septations, some which
demonstrate color Doppler flow. This structure measures 10 x 7.2 x
9.4 cm. A uterine fibroid is noted measuring 2 cm. There is a small
amount of pelvic free fluid.
IMPRESSION: 1. Single live IUP at 8 weeks and 2 days as detailed above.
2. Large complex cystic structure involving the left ovary. Findings
are favored to represent a hemorrhagic cyst, however outpatient
follow-up is recommended to confirm resolution of this finding as it
appears to have increased in size from prior study dated [DATE]. Moderate amount of subchorionic hemorrhage.
4. A 2 cm fibroid is noted.

## 2021-09-04 ENCOUNTER — Ambulatory Visit: Payer: Medicaid Other | Admitting: Certified Nurse Midwife

## 2021-09-25 ENCOUNTER — Ambulatory Visit (INDEPENDENT_AMBULATORY_CARE_PROVIDER_SITE_OTHER): Payer: Medicaid Other | Admitting: Certified Nurse Midwife

## 2021-09-25 ENCOUNTER — Encounter: Payer: Self-pay | Admitting: Certified Nurse Midwife

## 2021-09-25 VITALS — BP 118/81 | HR 88

## 2021-09-25 DIAGNOSIS — B009 Herpesviral infection, unspecified: Secondary | ICD-10-CM | POA: Diagnosis not present

## 2021-09-25 DIAGNOSIS — Z640 Problems related to unwanted pregnancy: Secondary | ICD-10-CM

## 2021-09-25 DIAGNOSIS — N941 Unspecified dyspareunia: Secondary | ICD-10-CM

## 2021-09-25 NOTE — Progress Notes (Signed)
Patient here to follow up on recent HSV 1 Dx. Patient denies any outbreaks or open lesions  Patient informed me that she is pregnant and wants to terminate the pregnancy. LMP 08/11/21

## 2021-09-25 NOTE — Progress Notes (Signed)
History:  Paula Gilbert is a 31 y.o. [phone] who presents to clinic today for discussion of positive HSV-1 blood test with no history of outbreak, just of exposure. Also found out she is pregnant (6wks) but strongly desires not to continue this pregnancy and wants to discuss options. No physical complaints.  The following portions of the patient's history were reviewed and updated as appropriate: allergies, current medications, family history, past medical history, social history, past surgical history and problem list.  Review of Systems:  Pertinent items noted in HPI and remainder of comprehensive ROS otherwise negative.    Objective:  Physical Exam BP 118/81   Pulse 88   LMP 08/11/2021 (Exact Date)  Physical Exam Vitals and nursing note reviewed.  Constitutional:      General: She is not in acute distress.    Appearance: Normal appearance. She is not ill-appearing.  HENT:     Head: Normocephalic and atraumatic.     Mouth/Throat:     Mouth: Mucous membranes are moist.  Cardiovascular:     Rate and Rhythm: Normal rate and regular rhythm.     Pulses: Normal pulses.  Pulmonary:     Effort: Pulmonary effort is normal.  Abdominal:     Palpations: Abdomen is soft.  Musculoskeletal:        General: Normal range of motion.  Skin:    General: Skin is warm.     Capillary Refill: Capillary refill takes less than 2 seconds.  Neurological:     Mental Status: She is alert and oriented to person, place, and time.  Psychiatric:        Mood and Affect: Mood normal.        Behavior: Behavior normal.        Thought Content: Thought content normal.   Labs and Imaging No results found for this or any previous visit (from the past 24 hour(s)).  No results found.   Assessment & Plan:  1. 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 Counseled that she could remain completely asymptomatic and never have a breakout. Reviewed options for treatment if needed.  2. Unwanted pregnancy with plans for termination - Reviewed options for termination including [facility] and [facility] [employer] (warned about protestors, etc). - Gave info about [facility] for financial assistance - Reviewed bleeding precautions and when to present to [facility] - Encouraged her to return post-abortion for birth control counseling  Follow up PRN.  Gabriel Carina, [place] 09/25/2021 8:02 PM

## 2022-02-01 ENCOUNTER — Emergency Department (HOSPITAL_BASED_OUTPATIENT_CLINIC_OR_DEPARTMENT_OTHER)
Admission: EM | Admit: 2022-02-01 | Discharge: 2022-02-01 | Disposition: A | Discharge status: Home / Self Care | Payer: Medicaid Other | Attending: Emergency Medicine | Admitting: Emergency Medicine

## 2022-02-01 ENCOUNTER — Encounter (HOSPITAL_BASED_OUTPATIENT_CLINIC_OR_DEPARTMENT_OTHER): Payer: Self-pay | Admitting: Emergency Medicine

## 2022-02-01 DIAGNOSIS — U071 COVID-19: Secondary | ICD-10-CM | POA: Insufficient documentation

## 2022-02-01 DIAGNOSIS — R519 Headache, unspecified: Secondary | ICD-10-CM | POA: Diagnosis present

## 2022-02-01 LAB — SARS CORONAVIRUS 2 BY RT PCR: SARS Coronavirus 2 by RT PCR: POSITIVE — AB

## 2022-02-01 MED ORDER — MOLNUPIRAVIR EUA 200MG CAPSULE: 4.0000 | ORAL_CAPSULE | Freq: Two times a day (BID) | ORAL | 0 refills | Status: AC

## 2022-02-01 NOTE — ED Triage Notes (Signed)
Covid symptoms of headache and body aches since last night.

## 2022-02-01 NOTE — ED Provider Notes (Signed)
[place] EMERGENCY DEPT Provider Note   CSN: [id number] Arrival date & time: 02/01/22  1534     History  Chief Complaint  Patient presents with   Headache   Generalized Body Aches    Paula Gilbert is a 31 y.o. female flu like sxs x 1 day. Plus body aches, chills, subjective fever. Denies nausea and vomiting.    Headache      Home Medications Prior to Admission medications   Medication Sig Start Date End Date Taking? Authorizing Provider  molnupiravir EUA (LAGEVRIO) 200 mg CAPS capsule Take 4 capsules (800 mg total) by mouth 2 (two) times daily for 5 days. 02/01/22 02/06/22 Yes Zhaire Locker, PA-C  ibuprofen (ADVIL) 800 MG tablet Take 1 tablet (800 mg total) by mouth 3 (three) times daily. Patient not taking: Reported on 02/28/2020 09/15/19   [name], MD  norgestimate-ethinyl estradiol (ORTHO-CYCLEN) 0.25-35 MG-MCG tablet Take 1 tablet by mouth daily. 02/28/20   Truett Mainland, DO      Allergies    Patient has no known allergies.    Review of Systems   Review of Systems  Neurological:  Positive for headaches.    Physical Exam Updated Vital Signs BP 114/85 (BP Location: Right Arm)   Pulse (!) 104   Temp 99.2 F (37.3 C)   Resp 16   Ht '5\' 7"'$  (1.702 m)   Wt 54.4 kg   LMP 08/30/2021 (Exact Date)   SpO2 100%   BMI 18.79 kg/m  Physical Exam Vitals and nursing note reviewed.  Constitutional:      General: She is not in acute distress.    Appearance: She is well-developed. She is ill-appearing. She is not toxic-appearing or diaphoretic.  HENT:     Head: Normocephalic and atraumatic.     Right Ear: External ear normal.     Left Ear: External ear normal.     Nose: Nose normal.     Mouth/Throat:     Mouth: Mucous membranes are moist.  Eyes:     General: No scleral icterus.    Conjunctiva/sclera: Conjunctivae normal.  Cardiovascular:     Rate and Rhythm: Normal rate and regular rhythm.     Heart sounds: Normal heart sounds. No  murmur heard.    No friction rub. No gallop.  Pulmonary:     Effort: Pulmonary effort is normal. No respiratory distress.     Breath sounds: Normal breath sounds.  Abdominal:     General: Bowel sounds are normal. There is no distension.     Palpations: Abdomen is soft. There is no mass.     Tenderness: There is no abdominal tenderness. There is no guarding.  Musculoskeletal:     Cervical back: Normal range of motion.  Skin:    General: Skin is warm and dry.  Neurological:     Mental Status: She is alert and oriented to person, place, and time.  Psychiatric:        Behavior: Behavior normal.     ED Results / Procedures / Treatments   Labs (all labs ordered are listed, but only abnormal results are displayed) Labs Reviewed  SARS CORONAVIRUS 2 BY RT PCR - Abnormal; Notable for the following components:      Result Value   SARS Coronavirus 2 by RT PCR POSITIVE (*)    All other components within normal limits    EKG None  Radiology No results found.  Procedures Procedures    Medications Ordered in ED Medications -  No data to display  ED Course/ Medical Decision Making/ A&P                           Medical Decision Making Patient with flu like sxs. Ddx includes viral URI, bacterial pneumonia. + for COVID 19 virus infection. No hypoxia or sob. D/c with molnupiravir.            Final Clinical Impression(s) / ED Diagnoses Final diagnoses:  [id number] virus infection    Rx / DC Orders ED Discharge Orders          Ordered    molnupiravir EUA (LAGEVRIO) 200 mg CAPS capsule  2 times daily        02/01/22 1827              Margarita Mail, PA-C 02/01/22 1830    Ezequiel Essex, MD 02/01/22 1920

## 2022-02-01 NOTE — Discharge Instructions (Signed)
Get help right away if: You have trouble breathing. You have pain or pressure in your chest. You are confused. You have bluish lips and fingernails. You have trouble waking from sleep. You have symptoms that get worse.

## 2022-05-05 NOTE — L&D Delivery Note (Signed)
[facility]N Faculty [facility] Delivery Note  Paula Gilbert is a 32 y.o. [id number] s/p SVD at [redacted]w[redacted]d. She was admitted for SOL.   ROM: 0h 38m with thick meconium fluid GBS Status:  Positive/-- (06/19 1200) Maximum Maternal Temperature:  Temp (24hrs), Avg:97.2 F (36.2 C), Min:97.2 F (36.2 C), Max:97.2 F (36.2 C)    Labor Progress: Patient arrived at 5 cm dilation and progressed spontaneously.   Delivery Date/Time: 11/16/2022 at 0849 Delivery: Called to room and patient was complete and pushing. Head delivered in LOA position. No nuchal cord present. Shoulder and body delivered in usual fashion. Infant with spontaneous cry, placed on mother's abdomen, dried and stimulated. Cord clamped x 2 after 1-minute delay, and cut by FOB. Cord blood drawn. Placenta delivered spontaneously with gentle cord traction. Fundus firm with massage and Pitocin. Labia, perineum, vagina, and cervix inspected with no lacerations.   Placenta:  spontaneous, intact, 3 vessel cord  Complications: None Lacerations:  None EBL:  472 mL Analgesia: Epidural    Infant: APGAR (1 MIN): 7  APGAR (5 MINS): 9  APGAR (10 MINS):    Weight: Pending   Derrel Nip, MD  OB Fellow  11/16/2022 9:28 AM

## 2022-05-07 ENCOUNTER — Ambulatory Visit (INDEPENDENT_AMBULATORY_CARE_PROVIDER_SITE_OTHER): Payer: Medicaid Other

## 2022-05-07 DIAGNOSIS — Z3490 Encounter for supervision of normal pregnancy, unspecified, unspecified trimester: Secondary | ICD-10-CM

## 2022-05-07 DIAGNOSIS — Z3201 Encounter for pregnancy test, result positive: Secondary | ICD-10-CM

## 2022-05-07 DIAGNOSIS — Z3200 Encounter for pregnancy test, result unknown: Secondary | ICD-10-CM

## 2022-05-07 LAB — POCT PREGNANCY, URINE: Preg Test, Ur: POSITIVE — AB

## 2022-05-07 NOTE — Progress Notes (Signed)
Possible Pregnancy  Here today for pregnancy confirmation. UPT in office today is positive. Pt reports first positive home 1 month prior. Reviewed dating with patient:   LMP: 02/01/22 approx EDD: 11/08/22 13w 4d today  Dating [place] scheduled to confirm EDD. Reviewed medications and allergies with patient.  Recommended pt begin prenatal vitamin and schedule prenatal care. [facility] office will call patient to schedule.  Annabell Howells, RN 05/07/2022  2:58 PM

## 2022-05-19 ENCOUNTER — Inpatient Hospital Stay (HOSPITAL_COMMUNITY)
Admission: AD | Admit: 2022-05-19 | Discharge: 2022-05-19 | Disposition: A | Discharge status: Home / Self Care | Payer: Medicaid Other | Attending: Family Medicine | Admitting: Family Medicine

## 2022-05-19 ENCOUNTER — Other Ambulatory Visit: Payer: Self-pay

## 2022-05-19 ENCOUNTER — Encounter (HOSPITAL_COMMUNITY): Payer: Self-pay

## 2022-05-19 DIAGNOSIS — Z3A15 15 weeks gestation of pregnancy: Secondary | ICD-10-CM | POA: Diagnosis not present

## 2022-05-19 DIAGNOSIS — O99352 Diseases of the nervous system complicating pregnancy, second trimester: Secondary | ICD-10-CM | POA: Insufficient documentation

## 2022-05-19 DIAGNOSIS — G43809 Other migraine, not intractable, without status migrainosus: Secondary | ICD-10-CM

## 2022-05-19 LAB — URINALYSIS, ROUTINE W REFLEX MICROSCOPIC
Bilirubin Urine: NEGATIVE
Glucose, UA: NEGATIVE mg/dL
Hgb urine dipstick: NEGATIVE
Ketones, ur: NEGATIVE mg/dL
Leukocytes,Ua: NEGATIVE
Nitrite: NEGATIVE
Protein, ur: NEGATIVE mg/dL
Specific Gravity, Urine: 1.013 (ref 1.005–1.030)
pH: 6 (ref 5.0–8.0)

## 2022-05-19 MED ORDER — METOCLOPRAMIDE HCL 10 MG PO TABS
10.0000 mg | ORAL_TABLET | Freq: Once | ORAL | Status: AC
  Administered 2022-05-19: 10 mg via ORAL
  Filled 2022-05-19: qty 1

## 2022-05-19 MED ORDER — ACETAMINOPHEN 500 MG PO TABS
1000.0000 mg | ORAL_TABLET | Freq: Once | ORAL | Status: AC
  Administered 2022-05-19: 1000 mg via ORAL
  Filled 2022-05-19: qty 2

## 2022-05-19 MED ORDER — CAFFEINE 200 MG PO TABS
200.0000 mg | ORAL_TABLET | Freq: Once | ORAL | Status: AC
  Administered 2022-05-19: 200 mg via ORAL
  Filled 2022-05-19: qty 1

## 2022-05-19 MED ORDER — DIPHENHYDRAMINE HCL 25 MG PO CAPS
25.0000 mg | ORAL_CAPSULE | Freq: Once | ORAL | Status: AC
  Administered 2022-05-19: 25 mg via ORAL
  Filled 2022-05-19: qty 1

## 2022-05-19 NOTE — MAU Note (Signed)
Paula Gilbert is a 32 y.o. at 26w2dhere in MAU reporting: migraine since yesterday morning. Has been seeing some spots. Has not taken any pain medication.  LMP: 02/01/22 approximately  Onset of complaint: yesterday  Pain score: 7/10  Vitals:   05/19/22 0858  BP: 107/70  Pulse: (!) 103  Resp: 16  Temp: 98.9 F (37.2 C)  SpO2: 100%     FHT:168  Lab orders placed from triage: UA
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
## 2022-05-19 NOTE — Discharge Instructions (Addendum)
You can take a magnesium supplement ('400mg'$ -'600mg'$  daily) along with Vitamin B12 675mg daily to help decrease frequency of migraines

## 2022-05-20 ENCOUNTER — Ambulatory Visit (INDEPENDENT_AMBULATORY_CARE_PROVIDER_SITE_OTHER): Payer: Medicaid Other

## 2022-05-20 ENCOUNTER — Encounter: Payer: Self-pay | Admitting: Advanced Practice Midwife

## 2022-05-20 ENCOUNTER — Other Ambulatory Visit: Payer: Self-pay | Admitting: Family Medicine

## 2022-05-20 ENCOUNTER — Ambulatory Visit (INDEPENDENT_AMBULATORY_CARE_PROVIDER_SITE_OTHER): Payer: Medicaid Other | Admitting: Advanced Practice Midwife

## 2022-05-20 DIAGNOSIS — Z3490 Encounter for supervision of normal pregnancy, unspecified, unspecified trimester: Secondary | ICD-10-CM

## 2022-05-20 DIAGNOSIS — Z3200 Encounter for pregnancy test, result unknown: Secondary | ICD-10-CM

## 2022-05-20 DIAGNOSIS — Z3A14 14 weeks gestation of pregnancy: Secondary | ICD-10-CM

## 2022-05-20 DIAGNOSIS — Z3492 Encounter for supervision of normal pregnancy, unspecified, second trimester: Secondary | ICD-10-CM

## 2022-05-20 MED ORDER — PRENATAL PLUS VITAMIN/MINERAL 27-1 MG PO TABS: 1.0000 | ORAL_TABLET | Freq: Every day | ORAL | 12 refills | Status: AC
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
## 2022-05-20 NOTE — Patient Instructions (Signed)
  CenteringPregnancy is a model of prenatal care that started 30 years ago and is used in about 600 practices around the [place]. You meet with a group of 8-12 women due around the same time as you. In [facility] you will have individual time with the provider and meet as a group. There's much more time for discussion and learning. You will actually have much more time with your provider in Cent[facility] than in traditional prenatal care.? You will come directly into the [facility] room and will not wait in the lobby so there is no wasted time. You will have 2-hour visits every 4 weeks then every 2 weeks. You will know your [facility] prenatal appointments in advance. In your last month of pregnancy, you may also come in for some individual visits. Additional appointments can be scheduled if you need more care. Studies have shown that CenteringPregnancy improves birth outcomes. We have seen especially big improvements in fewer Black women delivering babies who are too small or born too early. Visit the website [facility] for more information. Let your provider or clinic staff know if you want to sign up.

## 2022-06-03 ENCOUNTER — Telehealth: Payer: Medicaid Other

## 2022-06-10 ENCOUNTER — Encounter: Payer: Self-pay | Admitting: Family Medicine

## 2022-06-10 ENCOUNTER — Telehealth (INDEPENDENT_AMBULATORY_CARE_PROVIDER_SITE_OTHER): Payer: Medicaid Other

## 2022-06-10 DIAGNOSIS — Z3480 Encounter for supervision of other normal pregnancy, unspecified trimester: Secondary | ICD-10-CM | POA: Insufficient documentation

## 2022-06-10 MED ORDER — BLOOD PRESSURE MONITORING DEVI: 1.0000 | 0 refills | Status: DC

## 2022-06-10 NOTE — Patient Instructions (Signed)
[place] [facility] Providers  [place] ([address]) [facility] Brown, MD; Chambliss, MD; Eniola, MD; Hensel, MD; McDiarmid, MD; McIntyer, MD [address] [place], [place] [address] [phone] Mon-Fri 8:30-12:30, 1:30-5:00  Providers come to see babies during newborn hospitalization Only accepting infants of Mother's who are seen at Family Medicine Center or have siblings seen at   [facility] Center Medicaid - Yes; Tricare - Yes   [facility] Mulberry, MD [address] [place], [place] [address] [phone] Mon, Tue, Thur, Fri 8:30-5:00, Wed 10:00-7:00 (closed 1-2pm daily for lunch) Takes [place] residents with no insurance.  [place] only with Medicaid/insurance; Tricare - no  [facility] Ben-Davies, MD; Brown, MD; Chandler, MD; Ettefagh, MD; Grant, MD; Hanvey, MD; Herrin, MD; Jones,  MD; Lester, MD; McCormick, MD; McQueen, MD; Simha, MD; Stanley, MD; Stryffeler, NP [address], [place], [place] [address] [phone] Mon, Tue, Thur, Fri 8:30-5:30, Wed 9:30-5:30, Sat 8:30-12:30 Only accepting infants of first-time parents or siblings of current patients Hospital discharge coordinator will make follow-up appointment Medicaid - yes; Tricare - yes  [place] ([address]) [facility] Cox, MD; Davis, MD; Dovico, MD; Ettefaugh, MD; Lowe, MD; Nation, MD; Slimp, MD; Sumner, MD; Williams, MD [address], [place], [place] [address] [phone] Mon-Fri 8:30-5:00, closed for lunch 12:30-1:30; Sat-Sun 10:00-1:00 Accepting Newborns with commercial insurance only, must call prior to delivery to be accepted into  practice.  Medicaid - no, Tricare - yes   [facility] [address] [place], [place] [address] [phone] or [phone] Mon to Fri 8am to 10pm, Sat 8am to 1pm  (virtual only on weekends) Only accepts Medicaid Healthy Blue pts  [facility]  Artis, MD; Coccaro, MD; [place] Gardner, MD; Netherton, NP; Roper, MD; Wilmot, PA-C; Skinner, MD [address] [place], [place] [address] [phone] Mon-Fri 8:30-5:30 Medicaid - yes, Tricare - yes  [place] ([address]) [facility] Warner, MD [address], [place], [place] [address] [phone] Mon, Tues, Wed Fri 8:30-5:00, Sat 8:30-12:00, Closed Thursdays Accepting siblings of established patients and first time mom's if you call prenatally Medicaid- yes; Tricare - yes  [facility] Becker, PA; Hagler, MD; Quinn, PA-C; Scifres, PA; Sun, MD; Swayne, MD;  [address], [place], [place] [address] [phone] Mon-Fri 8:30-5:00, closed for lunch 1-2 Only accepting newborns of established patients Medicaid- no; Tricare - yes  [place] ([address]) [facility] Timberlake, MD; [address], [place], [place] [address] [phone] Mon-Fri 8:00-5:00 Medicaid - No; Tricare - Yes  [facility] [employer]  Brake, NP; Wharton, PA [address], [place], [place] [address] [phone] Mon-Fri 8:00-5:00 Medicaid - No, Tricare - Yes  [facility] Gay, MD; Quinlan, MD; Blatt, DNP [address] [place], [place] [address] [phone]  Mon-Fri 8:00-5:00 Medicaid - No; Tricare - Yes  [facility] [address] [place], [place] [address] [phone] Mon-Fri 8:30-5:00 (lunch 12:00-1:00) Medicaid -Yes; Tricare - Yes  [facility] Jordan, MD [address], [place], [place] [address] [phone] Mon-Fri 8:00-5:00 Seeing newborns of current patients only. No new patients Medicaid - No, Tricare - yes  [facility] Parker, MD [address] , [place] [address] [phone] Mon-Fri 8:00-5:00 Medicaid -yes as secondary coverage only;   Tricare - yes  [facility] Brecken, PA; Christy, NP; Dees, MD; DeClaire, MD; DeWeese, MD; Hodge, PA; Smoot, NP; Summer, MD; Vapne, MD [address] [place], [place] [address] [phone] Mon-Fri 8:30-5:00, Sat 9:00-11:00 Accepts commercial insurance ONLY. Offers free prenatal information sessions for families. Medicaid - No, Tricare - Call first  [facility] Bouska, MD; Gordon, PA; Jeffery, PA; Weber, PA [address] [place] [address] [place] [address] [phone] Mon-Fri 7:30-5:30 Medicaid - Yes; Tricare - yes  [place] ([address] & [address])  [facility] Reese, MD [address] [place], [place] [address] [phone] Mon-Thur 8:00-6:00, closed for lunch 12-2, closed Fridays Medicaid - yes; Tricare - no  [facility] Anderson, NP; Badger, MD; Beal, PA; Spencer, PA [address], [place], [place] [address] [phone] Mon-Fri 7:30-4:30 Medicaid - yes, Tricare - yes  [facility]  Agbuya, MD; Klett, NP; Romgoolam, MD; Rothstein, NP [address], [place], [place] [address] [phone] Mon-Fri 8:30-5:00, closed for lunch 1-2, Sat 8:30-12:00 - sick visits only Providers come to see babies at [facility] Only accepting newborns of siblings and first time parents ONLY if who have met with office prior to delivery Medicaid -Yes; Tricare - yes  [facility]  Golden, DO; Friddle, NP; Wallace, MD; Wood, MD:  [address], [place], [place] [address] [phone] Mon- Fri 8:00-5:00, Sat 9:00-12:00 - sick visits only Accepting siblings of established patients and first time mom/baby Medicaid - Yes; Tricare - yes Patients must have vaccinations (baby vaccines)  [place] ([address] &  [address])  [facility] [address] [place], [place] [address] [phone] Mon-Fri 8:00-5:00 Medicaid - no; Tricare - yes  [facility] Briscoe, MD; Schmidt, PA; Moreira, PA [address], [place], [place] [address] [phone] Mon-Fri 8:00-5:00 Medicaid- yes; Tricare - yes  [facility] Boyd, MD; Jones, NP; Osborn, PA [address], [place], [place] [address] [phone] Mon-Fri 8:00-5:00 Medicaid - Yes; Tricare - yes  [place] ([address])  [facility] Atkinson, PA; Calderon, PA; Cummings, MD; Dillard, MD; Henrish, NP; Isenhour, DO; Martin, PA; Olson, MD; Ott, MD; Phillips, MD; Valente, PA; VanDeven, PA; Yonjof, NP [address], [place], [place] [address] [phone] Mon-Fri 8:30-5:00, Sat 9:00-12:00 - sick only Please register online [email or web address] then schedule online or call office Medicaid-Yes; Tricare -yes  [facility]  Dabrusco, MD; Dial, MD; [place], MD; Fleenor, NP; Goolsby, PA; Tonuzi, MD; Turner, NP; West, MD [address], [place], [place] [address] [phone] Mon-Fri 8:00-5:30, Sat&Sun by appointment (phones open at 8:30) Medicaid - Yes; Tricare - yes  [place] ([address]) [facility] Allen, CPNP; Bates, MD; Gordon, MD; Mills, NP; Weinshilboum, DO [address], [place], [place] [address] [phone] M-F 8:00 - 5:15, Sat/Sun 9-12 sick visits only Medicaid - No; Tricare - yes  [facility] [place] [facility]  Brown, PA-C; Cowen, PA-C; Dennis, DO; Fuster, PA-C; Martin, PA-C; Shelton, PA-C; Spry, MD [address] [place],  [address] [phone] Mon-Thur 8:00-7:00, Fri 8:00-5:00 Accepting Medicaid for 13 and under only   [facility] [place] [facility] [place] Hayes, FNP; List, FNP; Moran, MD; Pitonzo, PA-C; Scholer,   MD; Spangle, FNP; Nzenwa, FNP; Jasper, MD; Moran, MD [address] [place], [place] [address] [phone] Mon-Fri 8:30-5:30 Medicaid - Yes; Tricare - yes  [facility] [address]  Kelly, CPNP; Logan, MD; Poth, MD; Ramadoss, MD; Staton, NP [address], [place], [place] [address] [phone] Mon-Thur 8:00-5:30, Fri 8:00-5:00, Sat 9:00-12:00 Medicaid - yes, Tricare - yes  [place] ([address])  [facility] [place] Masneri, DO; Meyers, MD; Nelson, PA [address], [place], [place] [address] [phone] Mon-Fri 8:00-5:00, closed for lunch 12-1 Medicaid - No; Tricare - yes  [facility] [place] McGowen, MD [address], [place], [place] [address] [phone] Mon-Fri 8:00-5:00 Medicaid - No; Tricare - yes  [facility] [place] MacDonald, MD; Nayak, MD; Kearns, MD; Jones, MD [address] [place] [address], [place], [place] [address] [phone] Mon-Fri 8:00-5:00 Medicaid- Yes; Tricare - yes  [place] ([address])  [facility] [place] Martin, PA-C; Tabori, MD [address], [place], [place] [address] [phone] Mon-Fri 8:00-5:00 Medicaid - No; Tricare - yes  [facility] [place]  Margin - CPNP [address], [place], [place] [address] [phone] Mon-Weds 8:00-6:00, Thurs-Fri 8:00-5:00, Sat 9:00-12:00 Medicaid - yes; Tricare - yes   [facility] [place] Aubuchon, MD; Brandon, PA [address] [place], [place] [address] [phone] Mon-Fri 8:00-5:00 Medicaid - yes; Tricare - yes  [place] [facility] Providers  [facility] [address], [place], [place] [address] [phone] M, Thur: 8am -8pm, Tues, Weds: 8am - 5pm; Fri: 8-1 Medicaid - Yes; Tricare -  yes  [facility] Mertz, MD; Johnson, MD; Wells, MD; Downs, PA; Hockenberger, PA [address], [place], [place] [address] [phone] M-F 8:30 - 5:00 Medicaid - Call office; Tricare -yes  [facility] Bonney, MD; Page, MD, Minter, MD; Mueller, PNP; Thomason, NP [address], [place], [place] [address] [phone] M-F 8:30 - 5:00, Sat/Sun 8:30 - 12:30 (sick visits) Medicaid - Call office; Tricare -yes  [facility] Lewis, MD; Shaub, PNP; Boylston, MD; Quaile, PA; Nonato, NP; Landon, CPNP [address], [place], [place] [address] [phone] M-F 8:30 - 5:00 Medicaid - Call office; Tricare - yes  [facility] [place] Cline, MD; Dvergsten, MD; Flores, MD; Kawatu, MD; Nogo, MD [address], [place], [place] [address] [phone] M-Thur: 8:00 - 5:00; Fri: 8:00 - 4:00 Medicaid - yes; Tricare - yes  [facility] [address] [place], [place], [place] [address] [phone] M-F: 8:30- 5:00, closed for lunch 12:30 - 1:00 Medicaid - yes; Tricare -yes  [facility] [place] [address], [place], [place] [address] [phone] M-F 8:00 - 5:00 Medicaid - yes; Tricare - yes  [facility] Johnson, DO; Rumball, DO; Wicker, NP [address] [place] [address], [place], [place] [address] [phone] M-F 8:00 - 5:00, Closed 12-1 for lunch Medicaid - Call; Tricare - yes  [facility] Stein, MD [address], [place], [place] [address] [phone] M-F: 8:00-5:00, Sat: 8:00 - noon Medicaid - call; Tricare -yes  [place] [facility] Providers  [facility] Collins, FNP-C [address], [place],  [address] [phone] M-W: 8:00-5:00, Thur: 8:00 - 7:00, Fri: 8:00 - noon Medicaid - yes; Tricare - yes  [facility] [place] Adams, FNP [address], [place],   [place] [address] [phone] M-F 8:00 - 5:00, Closed for lunch 12-1 Medicaid -  yes; Tricare - yes  [place] [facility] Providers  [facility] Smith, FNP, Melvin, MD, Fay, FNP-C [address] [place] [address] [place], [place], [place] [address] [phone] M-T 8:00-5:00, Wed-Fri 7:00-6:00 Medicaid - Yes; Tricare -yes  [facility] [place] Civiletti, DO; [address], [place], [place] [address] [phone] M-F 8:00 - 5:00, closed for lunch 12-1 Medicaid - Yes; Tricare - yes  [facility]  Barnes, MD; Bergdolt, MD; Caulfield, MD; Emrich, MD; Fiscus, MD; Hoppens, MD; Kylstra, MD, McPherson, MD; Todd, MD; Prestwood, MD; Waters, MD; Wood, MD [address], [place], [place] [address] [phone] M-F 8:00-5:00 Medicaid - yes; Tricare - yes  [facility] Cheema, MD (speaks Punjabi and Hindi) [address] [place], [place] [address] [phone] M-F: 8:30 - 5:00, closed 12:30 - 1 for lunch Medicaid - Yes; Tricare -yes  [place] [facility] Providers  [facility] Loda, MD; [place], MD; Burke, MD [address], [place], [place] [address] [phone] M-Th: 8:00 - 5:30, Fri: 8:00 - 12:00 Medicaid - yes; Tricare - yes  [facility] [place] Lookabill, NP; Meier, MD; Daffron, MD [address], [place], [place] [address] [phone] M-F: 8:00 - 5:00 Medicaid - yes; Tricare - yes  [facility] Busse, NP; Bowman, NP; Baune, NP; Entwistle, MD; Williams, MD, Huffman, NP, Ferguson, MD; Patel, DO [address], [place], [place] [address] [phone] M-F: 8:30 - 5:30p Medicaid - yes; Tricare - yes Other locations available as well  [facility] Rajan, MD; Wilson, MD; Morgan, PA-C, Domenech, PA-C; Myers, PA-C [address], [place], [place] [address] [phone] M-W: 8:00am - 7:00pm, Thurs: 8:00am - 8:00pm; Fri: 8:00am -  5:00pm, closed daily from 12-1 for lunch Medicaid - yes; Tricare - yes  [place] [facility] Providers  [facility] [place] Adams, MD; Crystal, FNP; Hadley, MD; Stokes, MD; Johnson, PNP; Brady, PA-C; West, PNP; Gardner, MD;  [address], [place], [place] [address] [phone] M - Fri: 8am - 5pm, Sat 9-noon Medicaid - Yes; Tricare -yes  [facility] [place] Nayak, MD; Jones, FNP; McDonald, MD; Kearns, MD [address] [place] [address], [place], [place] [phone] M-F 8:00 - 5:00 Medicaid - call; Tricare - yes  [facility] [place] Bell, MD; Emory, PNP; Pinder, MD; Anderson, MD; Light, PA-C; Johnson, MD; Latta, MD; Saul, PNP; Rainey, MD; Clifford, MD; McClung, MD [address], [place], [place] [address] [phone] M-F 8:00am - 5:00pm; Sat. 9:00 - 11:00 Medicaid - yes; Tricare - yes  [facility] [place] Soldato-Couture, MD [address], , [place] [address] [phone] M-F 8:00 - 5:00 Medicaid - [place] Medicaid only; Tricare - yes  [facility] [place] Walker, MD; Davis, PNP; Ajizian, MD [address] [place] [address], [place], [place] [address] [phone] M-F 8:00 - 5:00 Medicaid - yes; Tricare - yes  [facility] [place] Barry, MD; Brown, MD, Forest, MD, Hazek, MD; Hoyle, MD; Smith, MD; [address] [place], [place], [place] [address] [phone] M-F: 8-5 Medicaid - yes; Tricare - yes  [facility] [place] Brady, Md; Dowlen, MD; [address] [place] [address], [place], [place] [address] [phone] M-F 8-5 Medicaid - yes; Tricare - yes  [facility] Kearns, MD; Nayak, MD;   [place]old[place]-Courture, MD; Pellam-Palmer, DNP; Herring, PNP [address], [place], [place] [address] [phone] M-F 8-5 Medicaid - yes; Tricare - yes  [facility] Weathers, MD;  Merritt, PA-C; Davis-PA-C; Warnimont, MD [address], [place], [place] [address] [phone] M-F 7am - 5 pm Medicaid - call; Tricare - yes  [facility] Hill, PNP; Erickson, MD; Robinson, MD [address], [place], [place] [address] [phone] M-F 8-5 Medicaid - yes; Tricare - yes  [facility] Kribbs, MD; Warner, MD; Williams, FNP; Brooks, FNP; Boles, FNP; Romblad, PA-C; [place] - FNP [address], [place], [place] [address] [phone] M-F 8-5 Medicaid- yes; Tricare - yes  [facility] [place] [place] [place] [place] Yoder, MD; Verenes, MD; Armentrout, MD; Stewart, MD; Beasley, CPNP; Ford, MD; Erickson, MD; Rice, MD [address], [place], [place] [address] [phone] M-F: 8-5, Sat: 9-4, Sun 9-12 Medicaid - yes; Tricare - yes  [facility] Little, PNP; Davis, PNP [address], [place], [place] [address] [phone] M-F 8 - 5, closed 12-1 for lunch Medicaid - yes; Tricare - yes  [facility] Friesen, MD; Cnegia, MD; Rice, MD; Patel, DO [address], [place], [place] [address] [phone] M-F 8- 5:30 Medicaid - yes; Tricare - yes  [facility] [place] Zvolensky, MD; Ray, MD; Haas, MD [address] [address], [place], [place] [address] [phone] M: 8-7; Tues-Fri: 8-5; Sat: 9-12 Medicaid - yes; Tricare - yes  [facility] [place] Heinrich, MD; Meyer, MD; Clark, MD; Rhyne, MD; Aubuchon, MD [address], [place], [place] [address] [phone] M: 8-7; Tues-Fri: 8-5; Sat: 8:30-12:30 Medicaid - yes; Tricare - yes  [facility] [place] Bista, MD; Dillard, PA [address], [place], [place] [address] [phone] Mon-Fri: 8-5 Medicaid - yes;  Tricare - yes  [facility] [place] Beasley, CPNP; Mahle, CPNP; Rice, MD; Duffy, MD; Culler, MD; [address], [place], [place] [address] [phone] M-F: 8-5, closed 1-2 for lunch Medicaid - yes; Tricare - yes  [facility] Rickman, PA; Mounce, NP; Smith, MD; Jordan, CPNP; Darty, PA; Ball, MD; Wallace, MD [address] [place] [address], [place], [place] [address] [phone] M-Thurs: 8-7; Fri: 8-6; Sat: 9-12; Sun 2-4 Medicaid - yes; Tricare - yes  [facility] Brown, MD; Shin, MD; Goodman, DNP, FNP; Sebesta, DO; [address], [place], [place] [address] [phone] M-F: 8-5 Medicaid - yes; Tricare - yes  [place] [facility] Providers  [facility] Dough, MD; Welsh, NP [address], [place], [place] [address] [phone] M - Fri: 8am - 5pm, closed for lunch 12-1 Medicaid - Yes; Tricare - yes  [facility] Manandhar, MD; [place], MD; Sanger, DO; Vinocur, MD;Hall, PA; Walsh, PA; Campbell, NP [address], [place] [address] [phone] M-F 8:00 - 5:00, Sat 8:00 - 11:30 Medicaid - yes; Tricare - yes  [facility] Khan, MD; Redding, MD, Street, MD, [place], MD, Burgart, MD; Rhyne, NP; Dickinson, PA;  [address] [place] [address], , [place] [address] [phone] M-F 8:10am - 5:00pm Medicaid - yes; Tricare - yes    [facility] Connors, MD; Kime, NP [address], [place], [place] [address] [phone] M-F 8:00 - 5:00 Medicaid - [place] Medicaid only; Tricare - yes  [facility] [place] Whyte, MD; Fox, NP [address], [place], [place] [address] [phone] M-F 8:00 - 5:00; Closed for lunch 12 - 1:00 Medicaid - yes;  Tricare - yes  [facility] Penner, MD; Wil[place], FNP [address], [place], [place] [address] [phone] Mon 9-5; Tues/Wed 10-5; Thurs 8:30-5; Fri: 8-12:30 Medicaid - yes; Tricare - yes  [place] [facility]  [facility]  Golding, MD; Jackson, PA-C [address], [place], [place] [address] [phone] phone [phone] fax M-F 7:15 - 4:30 Medicaid - yes; Tricare - yes  [facility] Gosrani, MD; Meccariello, DO [address] [place], [place] [address] [phone] M-Fri: 8:30 - 5:00, closed for lunch everyday noon - 1pm Medicaid - Yes; Tricare - yes  [facility] Burdine, MD; Daniel, MD; Howard, MD; Sasser, MD; Boles, PA; Boyd, PA-C; Carroll, PA; McGee, PA; Skillman, PA; Wilson, PA [address] [place], [place] [address] [phone] M-Thurs: 7:30am - 7:00pm; Friday 7:30am - 4pm; Sat: 8:00 - 1:00 Medicaid - Yes; Tricare - yes  [facility] [place] Akhbari, MD; Law, MD; Qayumi, MD; Salvador, DO [address], [place], [place] [address] [phone] M-Thur: 8:00 - 5:00, Fri: 8:00 - Noon Medicaid - yes; Tricare - yes No [place] [employer]  [facility] [place] [facility] Dettinger, MD; Gottschalk, DO; Hawks, NP; Martin, NP; Morgan, NP; Milian, NP; Rakes, NP; Stacks, MD; Webster, PA [address], [place], [place] [address] [phone] M-F 8:00 - 5:00 Medicaid - yes; Tricare - yes  [facility] Collins, FNP-C; Bucio, FNP-C [address], [place], [place] [address] [phone] M, W, R 8:00-5:00, Tues: 8:00am - 7:00pm; Fri 8:00 - noon Medicaid - Yes; Tricare - yes  [facility] Khan, MD [address] [place] [address] [place], [place] [address] [phone]  M-Thurs 8:30-5:30, Fri: 8:30-12:30pm Medicaid - Yes; Tricare - N  

## 2022-06-10 NOTE — Progress Notes (Signed)
New OB Intake  I connected with Paula Gilbert  on 06/10/22 at  3:15 PM EST by MyChart Video Visit and verified that I am speaking with the correct person using two identifiers. Nurse is located at [facility] and pt is located at home .  I discussed the limitations, risks, security and privacy concerns of performing an evaluation and management service by telephone and the availability of in person appointments. I also discussed with the patient that there may be a patient responsible charge related to this service. The patient expressed understanding and agreed to proceed.  I explained I am completing New OB Intake today. We discussed EDD of 11/17/22 that is based on [place] on 05/20/22. Pt is G7/P4. I reviewed her allergies, medications, Medical/Surgical/OB history, and appropriate screenings. I informed her of [facility] services. [facility] information placed in AVS. Based on history, this is a high risk pregnancy.  Patient Active Problem List   Diagnosis Date Noted   History of pre-eclampsia 12/09/2018    Concerns addressed today  Delivery Plans Plans to deliver at [facility]. Patient given information for [facility] Healthy Baby website for more information about Women's and [place]. Patient is not interested in water birth. Offered upcoming OB visit with CNM to discuss further.  MyChart/Babyscripts MyChart access verified. I explained pt will have some visits in office and some virtually. Babyscripts instructions given and order placed. Patient verifies receipt of registration text/e-mail. Account successfully created and app downloaded.  Blood Pressure Cuff/Weight Scale Blood pressure cuff ordered for patient to pick-up from [employer]. Explained after first prenatal appt pt will check weekly and document in 41. Patient does have weight scale.  Anatomy US Explained first scheduled [place] will be around 19 weeks. Anatomy US scheduled for 07/03/22 at 0100p. Pt notified to arrive at  1245p.  Labs Discussed [name] genetic screening with patient. Would like both Panorama and Horizon drawn at new OB visit. Routine prenatal labs needed.  COVID Vaccine Patient has not had COVID vaccine.   Is patient a CenteringPregnancy candidate?  Accepted      Is patient a Mom+Baby Combined Care candidate?  Not a candidate   If accepted, Mom+Baby staff notified  Social Determinants of Health Food Insecurity: Patient denies food insecurity. WIC Referral: Patient is interested in referral to [facility].  Transportation: Patient denies transportation needs. Childcare: Discussed no children allowed at ultrasound appointments. Offered childcare services; patient declines childcare services at this time.  Interested in [place]? If yes, send referral.   First visit review I reviewed new OB appt with patient. Explained pt will be seen by Dr. Damita Dunnings at first visit; encounter routed to appropriate provider. Explained that patient will be seen by pregnancy navigator following visit with provider.   [name], [place] 06/10/2022  3:29 PM

## 2022-06-11 ENCOUNTER — Encounter: Payer: Self-pay | Admitting: Obstetrics and Gynecology

## 2022-06-19 DIAGNOSIS — O09292 Supervision of pregnancy with other poor reproductive or obstetric history, second trimester: Secondary | ICD-10-CM | POA: Insufficient documentation

## 2022-06-19 DIAGNOSIS — Z8759 Personal history of other complications of pregnancy, childbirth and the puerperium: Secondary | ICD-10-CM | POA: Insufficient documentation
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
## 2022-06-23 ENCOUNTER — Other Ambulatory Visit: Payer: Self-pay

## 2022-06-23 ENCOUNTER — Ambulatory Visit (INDEPENDENT_AMBULATORY_CARE_PROVIDER_SITE_OTHER): Payer: Medicaid Other | Admitting: Obstetrics and Gynecology

## 2022-06-23 ENCOUNTER — Encounter: Payer: Self-pay | Admitting: Obstetrics and Gynecology

## 2022-06-23 ENCOUNTER — Other Ambulatory Visit (HOSPITAL_COMMUNITY)
Admission: RE | Admit: 2022-06-23 | Discharge: 2022-06-23 | Disposition: A | Discharge status: Home / Self Care | Payer: Medicaid Other | Source: Ambulatory Visit | Attending: Obstetrics and Gynecology | Admitting: Obstetrics and Gynecology

## 2022-06-23 VITALS — BP 116/77 | HR 100 | Wt 124.6 lb

## 2022-06-23 DIAGNOSIS — Z3480 Encounter for supervision of other normal pregnancy, unspecified trimester: Secondary | ICD-10-CM | POA: Insufficient documentation

## 2022-06-23 DIAGNOSIS — Z3A18 18 weeks gestation of pregnancy: Secondary | ICD-10-CM | POA: Diagnosis not present

## 2022-06-23 DIAGNOSIS — Z8759 Personal history of other complications of pregnancy, childbirth and the puerperium: Secondary | ICD-10-CM | POA: Diagnosis not present

## 2022-06-23 DIAGNOSIS — O09292 Supervision of pregnancy with other poor reproductive or obstetric history, second trimester: Secondary | ICD-10-CM

## 2022-06-23 DIAGNOSIS — O2342 Unspecified infection of urinary tract in pregnancy, second trimester: Secondary | ICD-10-CM

## 2022-06-23 DIAGNOSIS — B9689 Other specified bacterial agents as the cause of diseases classified elsewhere: Secondary | ICD-10-CM

## 2022-06-23 DIAGNOSIS — N760 Acute vaginitis: Secondary | ICD-10-CM | POA: Diagnosis not present

## 2022-06-23 MED ORDER — NICOTINE POLACRILEX 2 MG MT GUM: 2.0000 mg | CHEWING_GUM | OROMUCOSAL | 1 refills | Status: DC | PRN

## 2022-06-23 MED ORDER — ASPIRIN 81 MG PO TBEC: 81.0000 mg | DELAYED_RELEASE_TABLET | Freq: Every day | ORAL | 12 refills | Status: DC

## 2022-06-23 MED ORDER — NICOTINE 7 MG/24HR TD PT24: 7.0000 mg | MEDICATED_PATCH | Freq: Every day | TRANSDERMAL | 1 refills | Status: DC

## 2022-06-24 LAB — CERVICOVAGINAL ANCILLARY ONLY
Bacterial Vaginitis (gardnerella): POSITIVE — AB
Candida Glabrata: NEGATIVE
Candida Vaginitis: NEGATIVE
Chlamydia: NEGATIVE
Comment: NEGATIVE
Comment: NEGATIVE
Comment: NEGATIVE
Comment: NEGATIVE
Comment: NEGATIVE
Comment: NORMAL
Neisseria Gonorrhea: NEGATIVE
Trichomonas: NEGATIVE

## 2022-06-24 MED ORDER — METRONIDAZOLE 500 MG PO TABS: 500.0000 mg | ORAL_TABLET | Freq: Two times a day (BID) | ORAL | 0 refills | Status: DC

## 2022-06-24 NOTE — Addendum Note (Signed)
Addended by: Radene Gunning A on: 06/24/2022 11:37 PM   Modules accepted: Orders

## 2022-06-25 LAB — AFP, SERUM, OPEN SPINA BIFIDA
AFP MoM: 1.3
AFP Value: 81.2 ng/mL
Gest. Age on Collection Date: 19 weeks
Maternal Age At EDD: 31.8 yr
OSBR Risk 1 IN: 9606
Test Results:: NEGATIVE
Weight: 125 [lb_av]

## 2022-06-25 LAB — COMPREHENSIVE METABOLIC PANEL
ALT: 7 IU/L (ref 0–32)
AST: 8 IU/L (ref 0–40)
Albumin/Globulin Ratio: 1.6 (ref 1.2–2.2)
Albumin: 3.9 g/dL (ref 3.9–4.9)
Alkaline Phosphatase: 99 IU/L (ref 44–121)
BUN/Creatinine Ratio: 15 (ref 9–23)
BUN: 8 mg/dL (ref 6–20)
Bilirubin Total: 0.2 mg/dL (ref 0.0–1.2)
CO2: 19 mmol/L — ABNORMAL LOW (ref 20–29)
Calcium: 9.3 mg/dL (ref 8.7–10.2)
Chloride: 106 mmol/L (ref 96–106)
Creatinine, Ser: 0.53 mg/dL — ABNORMAL LOW (ref 0.57–1.00)
Globulin, Total: 2.5 g/dL (ref 1.5–4.5)
Glucose: 75 mg/dL (ref 70–99)
Potassium: 4.3 mmol/L (ref 3.5–5.2)
Sodium: 143 mmol/L (ref 134–144)
Total Protein: 6.4 g/dL (ref 6.0–8.5)
eGFR: 127 mL/min/{1.73_m2} (ref >59)

## 2022-06-25 LAB — CYTOLOGY - PAP
Comment: NEGATIVE
Diagnosis: NEGATIVE
High risk HPV: NEGATIVE

## 2022-06-25 LAB — URINE CULTURE, OB REFLEX

## 2022-06-25 LAB — HEMOGLOBIN A1C
Est. average glucose Bld gHb Est-mCnc: 100 mg/dL
Hgb A1c MFr Bld: 5.1 % (ref 4.8–5.6)

## 2022-06-25 LAB — CULTURE, OB URINE

## 2022-06-25 MED ORDER — NITROFURANTOIN MONOHYD MACRO 100 MG PO CAPS: 100.0000 mg | ORAL_CAPSULE | Freq: Two times a day (BID) | ORAL | 0 refills | Status: DC

## 2022-06-25 NOTE — Addendum Note (Signed)
Addended by: Radene Gunning A on: 06/25/2022 08:20 PM   Modules accepted: Orders

## 2022-06-26 DIAGNOSIS — Z3480 Encounter for supervision of other normal pregnancy, unspecified trimester: Secondary | ICD-10-CM

## 2022-06-28 LAB — PANORAMA PRENATAL TEST FULL PANEL:PANORAMA TEST PLUS 5 ADDITIONAL MICRODELETIONS: FETAL FRACTION: 21.9

## 2022-06-30 ENCOUNTER — Encounter: Payer: Self-pay | Admitting: Obstetrics and Gynecology

## 2022-06-30 ENCOUNTER — Telehealth: Payer: Self-pay | Admitting: Internal Medicine

## 2022-06-30 LAB — CBC/D/PLT+RPR+RH+ABO+RUBIGG...
Antibody Screen: NEGATIVE
Basophils Absolute: 0 10*3/uL (ref 0.0–0.2)
Basos: 0 %
EOS (ABSOLUTE): 0 10*3/uL (ref 0.0–0.4)
Eos: 0 %
HCV Ab: NONREACTIVE
HIV Screen 4th Generation wRfx: NONREACTIVE
Hematocrit: 36 % (ref 34.0–46.6)
Hemoglobin: 12.3 g/dL (ref 11.1–15.9)
Hepatitis B Surface Ag: NEGATIVE
Immature Grans (Abs): 0.1 10*3/uL (ref 0.0–0.1)
Immature Granulocytes: 1 %
Lymphocytes Absolute: 1.9 10*3/uL (ref 0.7–3.1)
Lymphs: 16 %
MCH: 31.4 pg (ref 26.6–33.0)
MCHC: 34.2 g/dL (ref 31.5–35.7)
MCV: 92 fL (ref 79–97)
Monocytes Absolute: 0.8 10*3/uL (ref 0.1–0.9)
Monocytes: 7 %
Neutrophils Absolute: 9.1 10*3/uL — ABNORMAL HIGH (ref 1.4–7.0)
Neutrophils: 76 %
Platelets: 288 10*3/uL (ref 150–450)
RBC: 3.92 x10E6/uL (ref 3.77–5.28)
RDW: 12.3 % (ref 11.7–15.4)
RPR Ser Ql: NONREACTIVE
Rh Factor: POSITIVE
Rubella Antibodies, IGG: 1.75 index (ref >0.99)
WBC: 11.9 10*3/uL — ABNORMAL HIGH (ref 3.4–10.8)

## 2022-06-30 LAB — HCV INTERPRETATION

## 2022-06-30 LAB — PROTEIN / CREATININE RATIO, URINE

## 2022-06-30 NOTE — Telephone Encounter (Signed)
Patient said she received a message about the Gender of her baby but she can't find it in her Mychart

## 2022-06-30 NOTE — Telephone Encounter (Signed)
Called patient and reviewed how to find results in mychart. Patient was able to review her results. Patient had no other questions.

## 2022-07-03 ENCOUNTER — Encounter: Payer: Self-pay | Admitting: Obstetrics and Gynecology

## 2022-07-03 ENCOUNTER — Encounter: Payer: Self-pay | Admitting: *Deleted

## 2022-07-03 ENCOUNTER — Other Ambulatory Visit: Payer: Self-pay | Admitting: *Deleted

## 2022-07-03 ENCOUNTER — Ambulatory Visit: Payer: Medicaid Other | Attending: Obstetrics and Gynecology

## 2022-07-03 ENCOUNTER — Ambulatory Visit: Payer: Medicaid Other | Admitting: *Deleted

## 2022-07-03 VITALS — BP 105/65 | HR 84

## 2022-07-03 DIAGNOSIS — Z3480 Encounter for supervision of other normal pregnancy, unspecified trimester: Secondary | ICD-10-CM | POA: Diagnosis present

## 2022-07-03 DIAGNOSIS — Z3A20 20 weeks gestation of pregnancy: Secondary | ICD-10-CM | POA: Insufficient documentation

## 2022-07-03 DIAGNOSIS — O1492 Unspecified pre-eclampsia, second trimester: Secondary | ICD-10-CM | POA: Insufficient documentation

## 2022-07-03 DIAGNOSIS — O09292 Supervision of pregnancy with other poor reproductive or obstetric history, second trimester: Secondary | ICD-10-CM | POA: Diagnosis not present

## 2022-07-03 DIAGNOSIS — Z363 Encounter for antenatal screening for malformations: Secondary | ICD-10-CM | POA: Diagnosis not present

## 2022-07-03 DIAGNOSIS — O4440 Low lying placenta NOS or without hemorrhage, unspecified trimester: Secondary | ICD-10-CM | POA: Insufficient documentation

## 2022-07-03 DIAGNOSIS — D259 Leiomyoma of uterus, unspecified: Secondary | ICD-10-CM | POA: Diagnosis not present

## 2022-07-03 DIAGNOSIS — O4443 Low lying placenta NOS or without hemorrhage, third trimester: Secondary | ICD-10-CM

## 2022-07-07 ENCOUNTER — Encounter: Payer: Self-pay | Admitting: *Deleted

## 2022-07-07 ENCOUNTER — Telehealth: Payer: Self-pay | Admitting: *Deleted

## 2022-07-07 NOTE — Telephone Encounter (Signed)
Called and left a message about upcoming CenteringPregnancy prenatal visit and that I would send message with additonal information. Staci Acosta

## 2022-07-08 ENCOUNTER — Encounter: Payer: Self-pay | Admitting: *Deleted

## 2022-07-16 ENCOUNTER — Encounter: Payer: Self-pay | Admitting: *Deleted

## 2022-07-16 ENCOUNTER — Ambulatory Visit (INDEPENDENT_AMBULATORY_CARE_PROVIDER_SITE_OTHER): Payer: Medicaid Other | Admitting: Family Medicine

## 2022-07-16 VITALS — BP 118/74 | Wt 130.8 lb

## 2022-07-16 DIAGNOSIS — Z8759 Personal history of other complications of pregnancy, childbirth and the puerperium: Secondary | ICD-10-CM

## 2022-07-16 DIAGNOSIS — Z3A22 22 weeks gestation of pregnancy: Secondary | ICD-10-CM

## 2022-07-16 DIAGNOSIS — O2342 Unspecified infection of urinary tract in pregnancy, second trimester: Secondary | ICD-10-CM

## 2022-07-16 DIAGNOSIS — O4440 Low lying placenta NOS or without hemorrhage, unspecified trimester: Secondary | ICD-10-CM

## 2022-07-16 DIAGNOSIS — O4442 Low lying placenta NOS or without hemorrhage, second trimester: Secondary | ICD-10-CM

## 2022-07-16 DIAGNOSIS — O09292 Supervision of pregnancy with other poor reproductive or obstetric history, second trimester: Secondary | ICD-10-CM

## 2022-07-16 DIAGNOSIS — Z3480 Encounter for supervision of other normal pregnancy, unspecified trimester: Secondary | ICD-10-CM

## 2022-07-16 NOTE — Progress Notes (Signed)
   PRENATAL VISIT NOTE  Subjective:  Paula Gilbert is a 32 y.o. [id number] at [redacted]w[redacted]d being seen today for ongoing prenatal care.  She is currently monitored for the following issues for this low-risk pregnancy and has History of pre-eclampsia; UTI in pregnancy, antepartum, second trimester; Supervision of other normal pregnancy, antepartum; History of IUGR (intrauterine growth retardation) and stillbirth, currently pregnant, second trimester; and Low implantation of placenta on their problem list.  Patient reports no complaints.  Contractions: Not present. Vag. Bleeding: None.  Movement: Present. Denies leaking of fluid.   The following portions of the patient's history were reviewed and updated as appropriate: allergies, current medications, past family history, past medical history, past social history, past surgical history and problem list.   Objective:   Vitals:   07/16/22 1221  BP: 118/74  Weight: 130 lb 12.8 oz (59.3 kg)    Fetal Status: Fetal Heart Rate (bpm): 157 Fundal Height: 22 cm Movement: Present     General:  Alert, oriented and cooperative. Patient is in no acute distress.  Skin: Skin is warm and dry. No rash noted.   Cardiovascular: Normal heart rate noted  Respiratory: Normal respiratory effort, no problems with respiration noted  Abdomen: Soft, gravid, appropriate for gestational age.  Pain/Pressure: Present     Pelvic: Cervical exam deferred        Extremities: Normal range of motion.     Mental Status: Normal mood and affect. Normal behavior. Normal judgment and thought content.   Assessment and Plan:  Pregnancy: [id number] at [redacted]w[redacted]d 1. History of pre-eclampsia ASA was prescribed previously BP wnl today  2. History of IUGR (intrauterine growth retardation) and stillbirth, currently pregnant, second trimester Has [place] scheduled  3. UTI in pregnancy, antepartum, second trimester TOC next appt  4. Low implantation of placenta Discussed today  Has follow up  US  5. Supervision of other normal pregnancy, antepartum  - Given pregnancy belt today   Centering Pregnancy, Session#2: Reviewed rules for self-governance with group. Direct group to [employer].   Facilitated discussion today:  Nutrition, Back pain, Genetic screening options with AFP/Quad.   Mindfulness activity completed - participated in mindful eating activity Activity-demonstrated exercises to alleviate back pain.  Fundal height and FHR appropriate today unless noted otherwise in plan. Patient to continue group care.    Preterm labor symptoms and general obstetric precautions including but not limited to vaginal bleeding, contractions, leaking of fluid and fetal movement were reviewed in detail with the patient. Please refer to After Visit Summary for other counseling recommendations.   Return in about 4 weeks (around 08/13/2022) for Centering.  Future Appointments  Date Time Provider [place]  08/13/2022  9:00 AM CENTERING PROVIDER [facility]  08/28/2022 10:30 AM [facility] NURSE [facility]  08/28/2022 10:45 AM [facility] US5 [facility]US [place]  09/10/2022  9:00 AM CENTERING PROVIDER [facility]  09/17/2022  8:20 AM [facility]-W[facility]A LAB [facility]  09/24/2022  9:00 AM CENTERING PROVIDER [facility]  10/08/2022  9:00 AM CENTERING PROVIDER [facility]  10/22/2022  9:00 AM CENTERING PROVIDER [facility]  11/05/2022  9:00 AM CENTERING PROVIDER [facility]  11/19/2022  9:00 AM CENTERING PROVIDER [facility]  12/03/2022  9:00 AM CENTERING PROVIDER [facility] [place]    Caren Macadam, MD

## 2022-08-12 ENCOUNTER — Encounter: Payer: Self-pay | Admitting: *Deleted

## 2022-08-13 ENCOUNTER — Encounter: Payer: Self-pay | Admitting: Family Medicine

## 2022-08-13 ENCOUNTER — Other Ambulatory Visit: Payer: Self-pay

## 2022-08-13 ENCOUNTER — Ambulatory Visit (INDEPENDENT_AMBULATORY_CARE_PROVIDER_SITE_OTHER): Payer: Medicaid Other | Admitting: Family Medicine

## 2022-08-13 VITALS — BP 110/76 | HR 93 | Wt 139.2 lb

## 2022-08-13 DIAGNOSIS — O4440 Low lying placenta NOS or without hemorrhage, unspecified trimester: Secondary | ICD-10-CM

## 2022-08-13 DIAGNOSIS — Z8759 Personal history of other complications of pregnancy, childbirth and the puerperium: Secondary | ICD-10-CM

## 2022-08-13 DIAGNOSIS — Z3480 Encounter for supervision of other normal pregnancy, unspecified trimester: Secondary | ICD-10-CM

## 2022-08-13 DIAGNOSIS — O4442 Low lying placenta NOS or without hemorrhage, second trimester: Secondary | ICD-10-CM

## 2022-08-13 DIAGNOSIS — O2342 Unspecified infection of urinary tract in pregnancy, second trimester: Secondary | ICD-10-CM

## 2022-08-13 DIAGNOSIS — O09292 Supervision of pregnancy with other poor reproductive or obstetric history, second trimester: Secondary | ICD-10-CM

## 2022-08-13 DIAGNOSIS — O99330 Smoking (tobacco) complicating pregnancy, unspecified trimester: Secondary | ICD-10-CM | POA: Insufficient documentation

## 2022-08-13 DIAGNOSIS — Z3A26 26 weeks gestation of pregnancy: Secondary | ICD-10-CM

## 2022-08-13 DIAGNOSIS — O99332 Smoking (tobacco) complicating pregnancy, second trimester: Secondary | ICD-10-CM

## 2022-08-13 NOTE — Progress Notes (Addendum)
   PRENATAL VISIT NOTE  Subjective:  Paula Gilbert is a 32 y.o. [id number] at [redacted]w[redacted]d being seen today for ongoing prenatal care.  She is currently monitored for the following issues for this low-risk pregnancy and has History of pre-eclampsia; UTI in pregnancy, antepartum, second trimester; Supervision of other normal pregnancy, antepartum; History of IUGR (intrauterine growth retardation) and stillbirth, currently pregnant, second trimester; and Low implantation of placenta on their problem list.  Patient reports no complaints.  Contractions: Not present. Vag. Bleeding: None.  Movement: Present. Denies leaking of fluid.   The following portions of the patient's history were reviewed and updated as appropriate: allergies, current medications, past family history, past medical history, past social history, past surgical history and problem list.   Objective:   Vitals:   08/13/22 1053  BP: 110/76  Pulse: 93  Weight: 139 lb 3.2 oz (63.1 kg)    Fetal Status: Fetal Heart Rate (bpm): 166 Fundal Height: 24 cm Movement: Present     General:  Alert, oriented and cooperative. Patient is in no acute distress.  Skin: Skin is warm and dry. No rash noted.   Cardiovascular: Normal heart rate noted  Respiratory: Normal respiratory effort, no problems with respiration noted  Abdomen: Soft, gravid, appropriate for gestational age.  Pain/Pressure: Absent     Pelvic: Cervical exam deferred        Extremities: Normal range of motion.     Mental Status: Normal mood and affect. Normal behavior. Normal judgment and thought content.   Assessment and Plan:  Pregnancy: [id number] at [redacted]w[redacted]d 1. History of IUGR (intrauterine growth retardation) and stillbirth, currently pregnant, second trimester Has growth [place] scheduled  2. UTI in pregnancy, antepartum, second trimester - Culture, OB Urine  3. History of pre-eclampsia On ASA, BP WNL today  4. Supervision of other normal pregnancy, antepartum  Centering  Pregnancy, Session#3: Reviewed resources in [employer].   Facilitated discussion today:  Stress/Stress reduction and breastfeeding/infant nutrition Mindfulness activity -Body Scan    Fundal height and FHR appropriate today unless noted otherwise in plan. Patient to continue group care.   5. Low implantation of placenta Follow up US Last check was 1.6cm from os  Preterm labor symptoms and general obstetric precautions including but not limited to vaginal bleeding, contractions, leaking of fluid and fetal movement were reviewed in detail with the patient. Please refer to After Visit Summary for other counseling recommendations.   Return in about 4 weeks (around 09/10/2022) for Centering.  Future Appointments  Date Time Provider Department Center  08/20/2022  8:20 AM [facility]A LAB [facility]  08/28/2022 10:30 AM [facility] NURSE [facility]  08/28/2022 10:45 AM [facility] US5 [facility]  09/10/2022  9:00 AM CENTERING PROVIDER [facility]  09/24/2022  9:00 AM CENTERING PROVIDER [facility]  10/08/2022  9:00 AM CENTERING PROVIDER [facility]  10/22/2022  9:00 AM CENTERING PROVIDER [facility]  11/05/2022  9:00 AM CENTERING PROVIDER [facility]  11/19/2022  9:00 AM CENTERING PROVIDER [facility]  12/03/2022  9:00 AM CENTERING PROVIDER [facility]    Federico Flake, MD

## 2022-08-14 LAB — PROTEIN / CREATININE RATIO, URINE
Creatinine, Urine: 129 mg/dL
Protein, Ur: 30.3 mg/dL
Protein/Creat Ratio: 235 mg/g creat — ABNORMAL HIGH (ref 0–200)

## 2022-08-15 ENCOUNTER — Telehealth: Payer: Self-pay | Admitting: Family Medicine

## 2022-08-15 LAB — CULTURE, OB URINE

## 2022-08-15 LAB — URINE CULTURE, OB REFLEX

## 2022-08-15 NOTE — Telephone Encounter (Signed)
Patient would like a call back about her high levels of protein in her urine from her test results.

## 2022-08-18 NOTE — Telephone Encounter (Signed)
I reviewed her protein/ creatinine results with Dr. Alvester Morin who stated they are normal. Goal for protein /creatine ratio is less than 300. I called Paula Gilbert and discussed with her and she voices understanding. [name]

## 2022-08-20 ENCOUNTER — Other Ambulatory Visit: Payer: Self-pay

## 2022-08-20 ENCOUNTER — Other Ambulatory Visit: Payer: Medicaid Other

## 2022-08-20 DIAGNOSIS — Z3480 Encounter for supervision of other normal pregnancy, unspecified trimester: Secondary | ICD-10-CM

## 2022-08-21 LAB — CBC
Hematocrit: 34.1 % (ref 34.0–46.6)
Hemoglobin: 11.4 g/dL (ref 11.1–15.9)
MCH: 31 pg (ref 26.6–33.0)
MCHC: 33.4 g/dL (ref 31.5–35.7)
MCV: 93 fL (ref 79–97)
Platelets: 291 10*3/uL (ref 150–450)
RBC: 3.68 x10E6/uL — ABNORMAL LOW (ref 3.77–5.28)
RDW: 12.8 % (ref 11.7–15.4)
WBC: 13.2 10*3/uL — ABNORMAL HIGH (ref 3.4–10.8)

## 2022-08-21 LAB — HIV ANTIBODY (ROUTINE TESTING W REFLEX): HIV Screen 4th Generation wRfx: NONREACTIVE

## 2022-08-21 LAB — GLUCOSE TOLERANCE, 2 HOURS W/ 1HR
Glucose, 1 hour: 124 mg/dL (ref 70–179)
Glucose, 2 hour: 120 mg/dL (ref 70–152)
Glucose, Fasting: 71 mg/dL (ref 70–91)

## 2022-08-21 LAB — RPR: RPR Ser Ql: NONREACTIVE

## 2022-08-26 DIAGNOSIS — Z148 Genetic carrier of other disease: Secondary | ICD-10-CM | POA: Insufficient documentation

## 2022-08-26 DIAGNOSIS — D259 Leiomyoma of uterus, unspecified: Secondary | ICD-10-CM | POA: Insufficient documentation

## 2022-08-26 DIAGNOSIS — G129 Spinal muscular atrophy, unspecified: Secondary | ICD-10-CM | POA: Insufficient documentation

## 2022-08-28 ENCOUNTER — Other Ambulatory Visit: Payer: Self-pay | Admitting: *Deleted

## 2022-08-28 ENCOUNTER — Ambulatory Visit: Payer: Medicaid Other | Admitting: *Deleted

## 2022-08-28 ENCOUNTER — Ambulatory Visit: Payer: Medicaid Other | Attending: Obstetrics and Gynecology

## 2022-08-28 ENCOUNTER — Other Ambulatory Visit: Payer: Medicaid Other

## 2022-08-28 VITALS — BP 106/65 | HR 89

## 2022-08-28 DIAGNOSIS — D259 Leiomyoma of uterus, unspecified: Secondary | ICD-10-CM | POA: Diagnosis present

## 2022-08-28 DIAGNOSIS — O99333 Smoking (tobacco) complicating pregnancy, third trimester: Secondary | ICD-10-CM

## 2022-08-28 DIAGNOSIS — G129 Spinal muscular atrophy, unspecified: Secondary | ICD-10-CM

## 2022-08-28 DIAGNOSIS — Z3A28 28 weeks gestation of pregnancy: Secondary | ICD-10-CM

## 2022-08-28 DIAGNOSIS — O3413 Maternal care for benign tumor of corpus uteri, third trimester: Secondary | ICD-10-CM

## 2022-08-28 DIAGNOSIS — O4443 Low lying placenta NOS or without hemorrhage, third trimester: Secondary | ICD-10-CM | POA: Diagnosis present

## 2022-08-28 DIAGNOSIS — O3410 Maternal care for benign tumor of corpus uteri, unspecified trimester: Secondary | ICD-10-CM | POA: Diagnosis present

## 2022-08-28 DIAGNOSIS — O09293 Supervision of pregnancy with other poor reproductive or obstetric history, third trimester: Secondary | ICD-10-CM

## 2022-08-28 DIAGNOSIS — O365990 Maternal care for other known or suspected poor fetal growth, unspecified trimester, not applicable or unspecified: Secondary | ICD-10-CM

## 2022-08-28 DIAGNOSIS — Z148 Genetic carrier of other disease: Secondary | ICD-10-CM

## 2022-09-10 ENCOUNTER — Other Ambulatory Visit: Payer: Self-pay

## 2022-09-10 ENCOUNTER — Encounter: Payer: Self-pay | Admitting: Family Medicine

## 2022-09-10 ENCOUNTER — Encounter: Payer: Self-pay | Admitting: *Deleted

## 2022-09-10 ENCOUNTER — Other Ambulatory Visit (HOSPITAL_COMMUNITY)
Admission: RE | Admit: 2022-09-10 | Discharge: 2022-09-10 | Disposition: A | Discharge status: Home / Self Care | Payer: Medicaid Other | Source: Ambulatory Visit | Attending: Family Medicine | Admitting: Family Medicine

## 2022-09-10 ENCOUNTER — Ambulatory Visit: Payer: Medicaid Other

## 2022-09-10 VITALS — BP 105/72 | HR 86 | Wt 145.6 lb

## 2022-09-10 DIAGNOSIS — Z3480 Encounter for supervision of other normal pregnancy, unspecified trimester: Secondary | ICD-10-CM | POA: Insufficient documentation

## 2022-09-10 DIAGNOSIS — N898 Other specified noninflammatory disorders of vagina: Secondary | ICD-10-CM

## 2022-09-10 DIAGNOSIS — O4440 Low lying placenta NOS or without hemorrhage, unspecified trimester: Secondary | ICD-10-CM

## 2022-09-10 DIAGNOSIS — O26893 Other specified pregnancy related conditions, third trimester: Secondary | ICD-10-CM

## 2022-09-10 DIAGNOSIS — O2342 Unspecified infection of urinary tract in pregnancy, second trimester: Secondary | ICD-10-CM

## 2022-09-10 DIAGNOSIS — Z8759 Personal history of other complications of pregnancy, childbirth and the puerperium: Secondary | ICD-10-CM

## 2022-09-10 DIAGNOSIS — O2343 Unspecified infection of urinary tract in pregnancy, third trimester: Secondary | ICD-10-CM

## 2022-09-10 DIAGNOSIS — Z3A30 30 weeks gestation of pregnancy: Secondary | ICD-10-CM

## 2022-09-10 DIAGNOSIS — O4443 Low lying placenta NOS or without hemorrhage, third trimester: Secondary | ICD-10-CM

## 2022-09-10 DIAGNOSIS — B9689 Other specified bacterial agents as the cause of diseases classified elsewhere: Secondary | ICD-10-CM

## 2022-09-10 DIAGNOSIS — N760 Acute vaginitis: Secondary | ICD-10-CM

## 2022-09-10 NOTE — Addendum Note (Signed)
Addended by: Gerome Apley on: 09/10/2022 01:21 PM   Modules accepted: Orders

## 2022-09-10 NOTE — Progress Notes (Addendum)
   PRENATAL VISIT NOTE- Centering Pregnancy Session #4  Subjective:  Paula Gilbert is a 32 y.o. [id number] at [redacted]w[redacted]d being seen today for ongoing prenatal care.  She is currently monitored for the following issues for this low-risk pregnancy and has History of pre-eclampsia; UTI in pregnancy, antepartum, second trimester; Supervision of other normal pregnancy, antepartum; History of prior pregnancy with IUGR newborn; Low implantation of placenta; Tobacco use complicating pregnancy; Carrier of spinal muscular atrophy; and Uterine fibroids affecting pregnancy on their problem list.  Patient reports no complaints- having some hip and Low back pain, got pregnancy belt but not wearing it much. Working full time.  Contractions: Not present. Vag. Bleeding: None.  Movement: Present. Denies leaking of fluid.   The following portions of the patient's history were reviewed and updated as appropriate: allergies, current medications, past family history, past medical history, past social history, past surgical history and problem list.   Objective:   Vitals:   09/10/22 1138  BP: 105/72  Pulse: 86  Weight: 145 lb 9.6 oz (66 kg)    Fetal Status: Fetal Heart Rate (bpm): 155 Fundal Height: 27 cm Movement: Present     General:  Alert, oriented and cooperative. Patient is in no acute distress.  Skin: Skin is warm and dry. No rash noted.   Cardiovascular: Normal heart rate noted  Respiratory: Normal respiratory effort, no problems with respiration noted  Abdomen: Soft, gravid, appropriate for gestational age.  Pain/Pressure: Present     Pelvic: Cervical exam deferred        Extremities: Normal range of motion.     Mental Status: Normal mood and affect. Normal behavior. Normal judgment and thought content.   Assessment and Plan:  Pregnancy: W1X9147 at [redacted]w[redacted]d 1. History of prior pregnancy with IUGR newborn Has regular [place] scheduled FH is low today  Last EFW was 1116g, 16th% on 4/25  2. History of  pre-eclampsia Prescribed ASA  3. UTI in pregnancy, antepartum, second trimester TOC was negative  4. Low implantation of placenta > 2cm from os  5. Supervision of other normal pregnancy, antepartum  Centering Pregnancy, Session#4: Reviewed resources in [employer].   Facilitated discussion today:  Family dynamics/environment, family planning postpartum, and preterm labor Mindfulness activity Loving kindness meditation.    Fundal height and FHR appropriate today unless noted otherwise in plan. Patient to continue group care.    Preterm labor symptoms and general obstetric precautions including but not limited to vaginal bleeding, contractions, leaking of fluid and fetal movement were reviewed in detail with the patient. Please refer to After Visit Summary for other counseling recommendations.   Return in about 2 weeks (around 09/24/2022) for Centering Pregnancy.  Future Appointments  Date Time Provider Department Center  09/24/2022  9:00 AM CENTERING PROVIDER [facility]  10/01/2022  1:30 PM [facility] US2 [facility]  10/08/2022  9:00 AM CENTERING PROVIDER [facility]  10/22/2022  9:00 AM CENTERING PROVIDER [facility]  10/23/2022  8:30 AM [facility] US2 [facility]  11/05/2022  9:00 AM CENTERING PROVIDER [facility]  11/19/2022  9:00 AM CENTERING PROVIDER [facility]  12/03/2022  9:00 AM CENTERING PROVIDER [facility]    Federico Flake, MD

## 2022-09-10 NOTE — Progress Notes (Addendum)
Addendum: Pregnancy risk screening form completed.  Patient c/o vaginal discharge . Self swab obtained and sent.  [name]

## 2022-09-11 LAB — CERVICOVAGINAL ANCILLARY ONLY
Bacterial Vaginitis (gardnerella): POSITIVE — AB
Candida Glabrata: NEGATIVE
Candida Vaginitis: NEGATIVE
Chlamydia: NEGATIVE
Comment: NEGATIVE
Comment: NEGATIVE
Comment: NEGATIVE
Comment: NEGATIVE
Comment: NEGATIVE
Comment: NORMAL
Neisseria Gonorrhea: NEGATIVE
Trichomonas: NEGATIVE

## 2022-09-11 MED ORDER — METRONIDAZOLE 500 MG PO TABS
500.0000 mg | ORAL_TABLET | Freq: Two times a day (BID) | ORAL | 0 refills | Status: DC
Start: 2022-09-11 — End: 2022-10-03

## 2022-09-11 NOTE — Addendum Note (Signed)
Addended by: Geanie Berlin on: 09/11/2022 04:28 PM   Modules accepted: Orders

## 2022-09-17 ENCOUNTER — Other Ambulatory Visit: Payer: Medicaid Other

## 2022-09-24 ENCOUNTER — Other Ambulatory Visit: Payer: Self-pay

## 2022-09-24 ENCOUNTER — Ambulatory Visit: Payer: Medicaid Other

## 2022-09-24 VITALS — BP 104/70 | HR 99 | Wt 146.0 lb

## 2022-09-24 DIAGNOSIS — O4443 Low lying placenta NOS or without hemorrhage, third trimester: Secondary | ICD-10-CM

## 2022-09-24 DIAGNOSIS — O4440 Low lying placenta NOS or without hemorrhage, unspecified trimester: Secondary | ICD-10-CM

## 2022-09-24 DIAGNOSIS — Z23 Encounter for immunization: Secondary | ICD-10-CM

## 2022-09-24 DIAGNOSIS — Z3480 Encounter for supervision of other normal pregnancy, unspecified trimester: Secondary | ICD-10-CM

## 2022-09-24 DIAGNOSIS — O99333 Smoking (tobacco) complicating pregnancy, third trimester: Secondary | ICD-10-CM

## 2022-09-24 DIAGNOSIS — O2343 Unspecified infection of urinary tract in pregnancy, third trimester: Secondary | ICD-10-CM | POA: Diagnosis not present

## 2022-09-24 DIAGNOSIS — Z8759 Personal history of other complications of pregnancy, childbirth and the puerperium: Secondary | ICD-10-CM

## 2022-09-24 DIAGNOSIS — O2342 Unspecified infection of urinary tract in pregnancy, second trimester: Secondary | ICD-10-CM

## 2022-09-24 DIAGNOSIS — Z3A32 32 weeks gestation of pregnancy: Secondary | ICD-10-CM | POA: Diagnosis not present

## 2022-09-24 NOTE — Progress Notes (Signed)
   PRENATAL VISIT NOTE: Centering Pregnancy Group 11, Session 5  Subjective:  Paula Gilbert is a 32 y.o. [id number] at [redacted]w[redacted]d being seen today for ongoing prenatal care.  She is currently monitored for the following issues for this low-risk pregnancy and has History of pre-eclampsia; UTI in pregnancy, antepartum, second trimester; Supervision of other normal pregnancy, antepartum; History of prior pregnancy with IUGR newborn; Low implantation of placenta; Tobacco use complicating pregnancy; Carrier of spinal muscular atrophy; and Uterine fibroids affecting pregnancy on their problem list.  Patient reports no complaints.  Contractions: Irritability. Vag. Bleeding: None.  Movement: Present. Denies leaking of fluid.   The following portions of the patient's history were reviewed and updated as appropriate: allergies, current medications, past family history, past medical history, past social history, past surgical history and problem list.   Objective:   Vitals:   09/24/22 1142  BP: 104/70  Pulse: 99  Weight: 146 lb (66.2 kg)    Fetal Status: Fetal Heart Rate (bpm): 159 Fundal Height: 33 cm Movement: Present  Presentation: Vertex  General:  Alert, oriented and cooperative. Patient is in no acute distress.  Skin: Skin is warm and dry. No rash noted.   Cardiovascular: Normal heart rate noted  Respiratory: Normal respiratory effort, no problems with respiration noted  Abdomen: Soft, gravid, appropriate for gestational age.  Pain/Pressure: Absent     Pelvic: Cervical exam deferred        Extremities: Normal range of motion.     Mental Status: Normal mood and affect. Normal behavior. Normal judgment and thought content.   Assessment and Plan:  Pregnancy: [id number] at [redacted]w[redacted]d 1. UTI in pregnancy, antepartum, second trimester TOC neg  2. History of pre-eclampsia BP WNL today  3. History of prior pregnancy with IUGR newborn Last Growth was 16th% Has follow up appts for growth  4. Low  implantation of placenta Resolved  5. Supervision of other normal pregnancy, antepartum  Centering Pregnancy, Session#5: Reviewed resources in [employer].   Facilitated discussion today:  Stages of Labor, Sign of labor, labor positions, coping strategies.  1 hour of childbirth education provided today  Fundal height and FHR appropriate today unless noted otherwise in plan. Patient to continue group care.   6. Pregnancy complicated by tobacco use in third trimester Reducing and working on cessation  Preterm labor symptoms and general obstetric precautions including but not limited to vaginal bleeding, contractions, leaking of fluid and fetal movement were reviewed in detail with the patient. Please refer to After Visit Summary for other counseling recommendations.   Return in about 2 weeks (around 10/08/2022) for Routine prenatal care, Centering.  Future Appointments  Date Time Provider Department Center  10/01/2022  1:30 PM [facility] US2 [facility]  10/08/2022  9:00 AM CENTERING PROVIDER [facility]  10/22/2022  9:00 AM CENTERING PROVIDER [facility]  10/23/2022  8:30 AM [facility] US2 [facility]  10/31/2022  9:15 AM Carlynn Herald, CNM [facility]  11/05/2022  9:00 AM CENTERING PROVIDER [facility]  11/19/2022  9:00 AM CENTERING PROVIDER [facility]  12/03/2022  9:00 AM CENTERING PROVIDER [facility]    Federico Flake, MD

## 2022-10-01 ENCOUNTER — Ambulatory Visit: Payer: Medicaid Other | Attending: Maternal & Fetal Medicine

## 2022-10-01 DIAGNOSIS — O3413 Maternal care for benign tumor of corpus uteri, third trimester: Secondary | ICD-10-CM

## 2022-10-01 DIAGNOSIS — Z148 Genetic carrier of other disease: Secondary | ICD-10-CM

## 2022-10-01 DIAGNOSIS — Z3A33 33 weeks gestation of pregnancy: Secondary | ICD-10-CM

## 2022-10-01 DIAGNOSIS — D259 Leiomyoma of uterus, unspecified: Secondary | ICD-10-CM

## 2022-10-01 DIAGNOSIS — O99333 Smoking (tobacco) complicating pregnancy, third trimester: Secondary | ICD-10-CM | POA: Insufficient documentation

## 2022-10-01 DIAGNOSIS — O365990 Maternal care for other known or suspected poor fetal growth, unspecified trimester, not applicable or unspecified: Secondary | ICD-10-CM | POA: Diagnosis not present

## 2022-10-01 DIAGNOSIS — O09293 Supervision of pregnancy with other poor reproductive or obstetric history, third trimester: Secondary | ICD-10-CM

## 2022-10-01 DIAGNOSIS — O285 Abnormal chromosomal and genetic finding on antenatal screening of mother: Secondary | ICD-10-CM

## 2022-10-01 DIAGNOSIS — F17210 Nicotine dependence, cigarettes, uncomplicated: Secondary | ICD-10-CM | POA: Diagnosis not present

## 2022-10-01 DIAGNOSIS — O4443 Low lying placenta NOS or without hemorrhage, third trimester: Secondary | ICD-10-CM

## 2022-10-03 ENCOUNTER — Inpatient Hospital Stay (HOSPITAL_COMMUNITY)
Admission: AD | Admit: 2022-10-03 | Discharge: 2022-10-03 | Disposition: A | Discharge status: Home / Self Care | Payer: Medicaid Other | Attending: Obstetrics and Gynecology | Admitting: Obstetrics and Gynecology

## 2022-10-03 ENCOUNTER — Encounter (HOSPITAL_COMMUNITY): Payer: Self-pay | Admitting: Obstetrics and Gynecology

## 2022-10-03 DIAGNOSIS — O99283 Endocrine, nutritional and metabolic diseases complicating pregnancy, third trimester: Secondary | ICD-10-CM | POA: Insufficient documentation

## 2022-10-03 DIAGNOSIS — R55 Syncope and collapse: Secondary | ICD-10-CM | POA: Insufficient documentation

## 2022-10-03 DIAGNOSIS — O26893 Other specified pregnancy related conditions, third trimester: Secondary | ICD-10-CM | POA: Diagnosis present

## 2022-10-03 DIAGNOSIS — R109 Unspecified abdominal pain: Secondary | ICD-10-CM | POA: Insufficient documentation

## 2022-10-03 DIAGNOSIS — Z3A33 33 weeks gestation of pregnancy: Secondary | ICD-10-CM | POA: Diagnosis not present

## 2022-10-03 DIAGNOSIS — E876 Hypokalemia: Secondary | ICD-10-CM | POA: Diagnosis not present

## 2022-10-03 DIAGNOSIS — E8809 Other disorders of plasma-protein metabolism, not elsewhere classified: Secondary | ICD-10-CM

## 2022-10-03 DIAGNOSIS — E871 Hypo-osmolality and hyponatremia: Secondary | ICD-10-CM | POA: Diagnosis not present

## 2022-10-03 HISTORY — DX: Urinary tract infection, site not specified: N39.0

## 2022-10-03 HISTORY — DX: Benign neoplasm of connective and other soft tissue, unspecified: D21.9

## 2022-10-03 LAB — URINALYSIS, ROUTINE W REFLEX MICROSCOPIC
Bacteria, UA: NONE SEEN
Bilirubin Urine: NEGATIVE
Glucose, UA: NEGATIVE mg/dL
Hgb urine dipstick: NEGATIVE
Ketones, ur: NEGATIVE mg/dL
Leukocytes,Ua: NEGATIVE
Nitrite: NEGATIVE
Protein, ur: 30 mg/dL — AB
Specific Gravity, Urine: 1.014 (ref 1.005–1.030)
pH: 7 (ref 5.0–8.0)

## 2022-10-03 LAB — CBC
HCT: 33.2 % — ABNORMAL LOW (ref 36.0–46.0)
Hemoglobin: 11.5 g/dL — ABNORMAL LOW (ref 12.0–15.0)
MCH: 31.5 pg (ref 26.0–34.0)
MCHC: 34.6 g/dL (ref 30.0–36.0)
MCV: 91 fL (ref 80.0–100.0)
Platelets: 326 10*3/uL (ref 150–400)
RBC: 3.65 MIL/uL — ABNORMAL LOW (ref 3.87–5.11)
RDW: 13.1 % (ref 11.5–15.5)
WBC: 13.7 10*3/uL — ABNORMAL HIGH (ref 4.0–10.5)
nRBC: 0 % (ref 0.0–0.2)

## 2022-10-03 LAB — COMPREHENSIVE METABOLIC PANEL
ALT: 16 U/L (ref 0–44)
AST: 15 U/L (ref 15–41)
Albumin: 2.7 g/dL — ABNORMAL LOW (ref 3.5–5.0)
Alkaline Phosphatase: 166 U/L — ABNORMAL HIGH (ref 38–126)
Anion gap: 7 (ref 5–15)
BUN: <5 mg/dL — ABNORMAL LOW (ref 6–20)
CO2: 22 mmol/L (ref 22–32)
Calcium: 8.1 mg/dL — ABNORMAL LOW (ref 8.9–10.3)
Chloride: 103 mmol/L (ref 98–111)
Creatinine, Ser: 0.45 mg/dL (ref 0.44–1.00)
GFR, Estimated: >60 mL/min (ref >60)
Glucose, Bld: 90 mg/dL (ref 70–99)
Potassium: 3 mmol/L — ABNORMAL LOW (ref 3.5–5.1)
Sodium: 132 mmol/L — ABNORMAL LOW (ref 135–145)
Total Bilirubin: 0.5 mg/dL (ref 0.3–1.2)
Total Protein: 5.8 g/dL — ABNORMAL LOW (ref 6.5–8.1)

## 2022-10-03 MED ORDER — POTASSIUM CHLORIDE CRYS ER 20 MEQ PO TBCR
40.0000 meq | EXTENDED_RELEASE_TABLET | Freq: Once | ORAL | Status: AC
  Administered 2022-10-03: 40 meq via ORAL
  Filled 2022-10-03: qty 2

## 2022-10-03 MED ORDER — LACTATED RINGERS IV BOLUS
1000.0000 mL | Freq: Once | INTRAVENOUS | Status: AC
  Administered 2022-10-03: 1000 mL via INTRAVENOUS
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
## 2022-10-03 NOTE — Discharge Instructions (Signed)
Please eat protein rich food like meat, eggs, beans to boost your protein levels. Try to eat foods rich in potassium  Try to drink lots of water through the day.
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
## 2022-10-03 NOTE — MAU Note (Signed)
Tolerated the KLOR.Marland Kitchen  list of Potassium rich food given.  Diet and fluids discussed, changes/choices encouraged

## 2022-10-08 ENCOUNTER — Encounter: Payer: Self-pay | Admitting: *Deleted

## 2022-10-08 ENCOUNTER — Ambulatory Visit: Payer: Medicaid Other

## 2022-10-08 VITALS — BP 107/72 | HR 103 | Wt 147.8 lb

## 2022-10-08 DIAGNOSIS — Z3A34 34 weeks gestation of pregnancy: Secondary | ICD-10-CM

## 2022-10-08 DIAGNOSIS — O4440 Low lying placenta NOS or without hemorrhage, unspecified trimester: Secondary | ICD-10-CM

## 2022-10-08 DIAGNOSIS — Z8759 Personal history of other complications of pregnancy, childbirth and the puerperium: Secondary | ICD-10-CM

## 2022-10-08 DIAGNOSIS — O4443 Low lying placenta NOS or without hemorrhage, third trimester: Secondary | ICD-10-CM

## 2022-10-08 DIAGNOSIS — O99333 Smoking (tobacco) complicating pregnancy, third trimester: Secondary | ICD-10-CM

## 2022-10-08 DIAGNOSIS — Z3480 Encounter for supervision of other normal pregnancy, unspecified trimester: Secondary | ICD-10-CM

## 2022-10-08 NOTE — Progress Notes (Signed)
   PRENATAL VISIT NOTE Centering Pregnancy Group 11, Session 6  Subjective:  Paula Gilbert is a 32 y.o. [id number] at [redacted]w[redacted]d being seen today for ongoing prenatal care.  She is currently monitored for the following issues for this low-risk pregnancy and has History of pre-eclampsia; UTI in pregnancy, antepartum, second trimester; Supervision of other normal pregnancy, antepartum; History of prior pregnancy with IUGR newborn; Low implantation of placenta; Tobacco use complicating pregnancy; Carrier of spinal muscular atrophy; and Uterine fibroids affecting pregnancy on their problem list.  Patient reports no complaints.  Contractions: Irregular. Vag. Bleeding: None.  Movement: Present. Denies leaking of fluid.   The following portions of the patient's history were reviewed and updated as appropriate: allergies, current medications, past family history, past medical history, past social history, past surgical history and problem list.   Objective:   Vitals:   10/08/22 1427  BP: 107/72  Pulse: (!) 103  Weight: 147 lb 12.8 oz (67 kg)    Fetal Status: Fetal Heart Rate (bpm): 148 Fundal Height: 34 cm Movement: Present  Presentation: Vertex  General:  Alert, oriented and cooperative. Patient is in no acute distress.  Skin: Skin is warm and dry. No rash noted.   Cardiovascular: Normal heart rate noted  Respiratory: Normal respiratory effort, no problems with respiration noted  Abdomen: Soft, gravid, appropriate for gestational age.  Pain/Pressure: Absent     Pelvic: Cervical exam deferred        Extremities: Normal range of motion.     Mental Status: Normal mood and affect. Normal behavior. Normal judgment and thought content.   Assessment and Plan:  Pregnancy: [id number] at [redacted]w[redacted]d 1. History of pre-eclampsia On ASA  2. History of prior pregnancy with IUGR newborn Last growth 2042g, 26th%  3. Low implantation of placenta RESOLVED  4. Supervision of other normal pregnancy,  antepartum  Centering Pregnancy, Session#6: Reviewed resources in [employer].  Facilitated discussion today: family planning/reproductive life planning, coping strategies   Mindfulness activity with positive affirmations   Fundal height and FHR appropriate today unless noted otherwise in plan. Patient to continue group care.    5. Pregnancy complicated by tobacco use in third trimester Continue to try to quit   Preterm labor symptoms and general obstetric precautions including but not limited to vaginal bleeding, contractions, leaking of fluid and fetal movement were reviewed in detail with the patient. Please refer to After Visit Summary for other counseling recommendations.   Return in about 2 weeks (around 10/22/2022) for Centering.  Future Appointments  Date Time Provider Department Center  10/22/2022  9:00 AM CENTERING PROVIDER [facility]  10/23/2022  8:30 AM [facility] US2 [facility]  10/31/2022  9:15 AM Carlynn Herald, CNM [facility]  11/05/2022  9:00 AM CENTERING PROVIDER [facility]  11/12/2022  9:00 AM CENTERING PROVIDER [facility]  12/03/2022  9:00 AM CENTERING PROVIDER [facility]    Federico Flake, MD

## 2022-10-22 ENCOUNTER — Encounter: Payer: Self-pay | Admitting: *Deleted

## 2022-10-22 ENCOUNTER — Ambulatory Visit (INDEPENDENT_AMBULATORY_CARE_PROVIDER_SITE_OTHER): Payer: Medicaid Other | Admitting: Family Medicine

## 2022-10-22 ENCOUNTER — Other Ambulatory Visit (HOSPITAL_COMMUNITY)
Admission: RE | Admit: 2022-10-22 | Discharge: 2022-10-22 | Disposition: A | Discharge status: Home / Self Care | Payer: Medicaid Other | Source: Ambulatory Visit | Attending: Family Medicine | Admitting: Family Medicine

## 2022-10-22 VITALS — BP 111/77 | HR 97 | Wt 148.4 lb

## 2022-10-22 DIAGNOSIS — O2342 Unspecified infection of urinary tract in pregnancy, second trimester: Secondary | ICD-10-CM

## 2022-10-22 DIAGNOSIS — Z3A36 36 weeks gestation of pregnancy: Secondary | ICD-10-CM | POA: Insufficient documentation

## 2022-10-22 DIAGNOSIS — Z3480 Encounter for supervision of other normal pregnancy, unspecified trimester: Secondary | ICD-10-CM | POA: Diagnosis present

## 2022-10-22 DIAGNOSIS — O2343 Unspecified infection of urinary tract in pregnancy, third trimester: Secondary | ICD-10-CM | POA: Diagnosis not present

## 2022-10-22 DIAGNOSIS — O3413 Maternal care for benign tumor of corpus uteri, third trimester: Secondary | ICD-10-CM | POA: Diagnosis not present

## 2022-10-22 DIAGNOSIS — Z8759 Personal history of other complications of pregnancy, childbirth and the puerperium: Secondary | ICD-10-CM

## 2022-10-22 DIAGNOSIS — O99333 Smoking (tobacco) complicating pregnancy, third trimester: Secondary | ICD-10-CM

## 2022-10-22 DIAGNOSIS — D259 Leiomyoma of uterus, unspecified: Secondary | ICD-10-CM

## 2022-10-22 NOTE — Progress Notes (Signed)
   PRENATAL VISIT NOTE: Centering Pregnancy Group 11, Session 7   Subjective:  Paula Gilbert is a 31 y.o. [id number] at [redacted]w[redacted]d being seen today for ongoing prenatal care.  She is currently monitored for the following issues for this low-risk pregnancy and has History of pre-eclampsia; UTI in pregnancy, antepartum, second trimester; Supervision of other normal pregnancy, antepartum; History of prior pregnancy with IUGR newborn; Low implantation of placenta; Tobacco use complicating pregnancy; Carrier of spinal muscular atrophy; and Uterine fibroids affecting pregnancy on their problem list.  Patient reports no complaints.  Contractions: Irritability. Vag. Bleeding: None.  Movement: Present. Denies leaking of fluid.   The following portions of the patient's history were reviewed and updated as appropriate: allergies, current medications, past family history, past medical history, past social history, past surgical history and problem list.   Objective:   Vitals:   10/22/22 0914  BP: 111/77  Pulse: 97  Weight: 148 lb 6.4 oz (67.3 kg)    Fetal Status: Fetal Heart Rate (bpm): 154 Fundal Height: 36 cm Movement: Present  Presentation: Vertex  General:  Alert, oriented and cooperative. Patient is in no acute distress.  Skin: Skin is warm and dry. No rash noted.   Cardiovascular: Normal heart rate noted  Respiratory: Normal respiratory effort, no problems with respiration noted  Abdomen: Soft, gravid, appropriate for gestational age.  Pain/Pressure: Present     Pelvic: Cervical exam deferred        Extremities: Normal range of motion.     Mental Status: Normal mood and affect. Normal behavior. Normal judgment and thought content.   Assessment and Plan:  Pregnancy: [id number] at [redacted]w[redacted]d 1. Leiomyoma of uterus affecting pregnancy in third trimester stable  2. UTI in pregnancy, antepartum, second trimester TOC neg  3. Supervision of other normal pregnancy, antepartum  Centering Pregnancy,  Session#7: Reviewed resources in [employer].   Facilitated discussion today:  Coping with labor and postpartum mood changes Mindfulness activity with mindful listening  Fundal height and FHR appropriate today unless noted otherwise in plan. Patient to continue group care.  Self collected GBS/GC/CT  4. Pregnancy complicated by tobacco use in third trimester Continue to try to reduce   5. History of prior pregnancy with IUGR newborn Last growth [place] was 5/29 EFW 26th%  Repeat US 6/20  6. History of pre-eclampsia BP today WNL  Preterm labor symptoms and general obstetric precautions including but not limited to vaginal bleeding, contractions, leaking of fluid and fetal movement were reviewed in detail with the patient. Please refer to After Visit Summary for other counseling recommendations.   Return in about 1 week (around 10/29/2022) for Routine prenatal care, Centering.  Future Appointments  Date Time Provider Department Center  10/23/2022  8:30 AM [facility] US2 [facility]  10/31/2022  9:15 AM Carlynn Herald, CNM [facility]  11/05/2022  9:00 AM CENTERING PROVIDER [facility]  11/12/2022  9:00 AM CENTERING PROVIDER [facility]  11/20/2022 10:15 AM Milas Hock, MD [facility]  12/03/2022  9:00 AM CENTERING PROVIDER [facility]    Federico Flake, MD

## 2022-10-23 ENCOUNTER — Ambulatory Visit: Payer: Medicaid Other | Attending: Maternal & Fetal Medicine

## 2022-10-23 DIAGNOSIS — O365990 Maternal care for other known or suspected poor fetal growth, unspecified trimester, not applicable or unspecified: Secondary | ICD-10-CM | POA: Insufficient documentation

## 2022-10-23 DIAGNOSIS — Z3A36 36 weeks gestation of pregnancy: Secondary | ICD-10-CM

## 2022-10-23 DIAGNOSIS — O285 Abnormal chromosomal and genetic finding on antenatal screening of mother: Secondary | ICD-10-CM

## 2022-10-23 DIAGNOSIS — O3413 Maternal care for benign tumor of corpus uteri, third trimester: Secondary | ICD-10-CM | POA: Diagnosis not present

## 2022-10-23 DIAGNOSIS — O09293 Supervision of pregnancy with other poor reproductive or obstetric history, third trimester: Secondary | ICD-10-CM

## 2022-10-23 DIAGNOSIS — Z148 Genetic carrier of other disease: Secondary | ICD-10-CM

## 2022-10-23 DIAGNOSIS — O99333 Smoking (tobacco) complicating pregnancy, third trimester: Secondary | ICD-10-CM | POA: Diagnosis not present

## 2022-10-23 DIAGNOSIS — D259 Leiomyoma of uterus, unspecified: Secondary | ICD-10-CM

## 2022-10-23 LAB — CERVICOVAGINAL ANCILLARY ONLY
Chlamydia: NEGATIVE
Comment: NEGATIVE
Comment: NORMAL
Neisseria Gonorrhea: NEGATIVE

## 2022-10-25 LAB — CULTURE, BETA STREP (GROUP B ONLY): Strep Gp B Culture: POSITIVE — AB

## 2022-10-27 ENCOUNTER — Encounter: Payer: Self-pay | Admitting: Family Medicine

## 2022-10-27 DIAGNOSIS — O99820 Streptococcus B carrier state complicating pregnancy: Secondary | ICD-10-CM | POA: Insufficient documentation

## 2022-10-31 ENCOUNTER — Ambulatory Visit (INDEPENDENT_AMBULATORY_CARE_PROVIDER_SITE_OTHER): Payer: Medicaid Other | Admitting: Certified Nurse Midwife

## 2022-10-31 VITALS — BP 111/76 | HR 111 | Wt 147.0 lb

## 2022-10-31 DIAGNOSIS — O09292 Supervision of pregnancy with other poor reproductive or obstetric history, second trimester: Secondary | ICD-10-CM

## 2022-10-31 DIAGNOSIS — Z3493 Encounter for supervision of normal pregnancy, unspecified, third trimester: Secondary | ICD-10-CM

## 2022-10-31 DIAGNOSIS — Z3A37 37 weeks gestation of pregnancy: Secondary | ICD-10-CM

## 2022-10-31 DIAGNOSIS — O09293 Supervision of pregnancy with other poor reproductive or obstetric history, third trimester: Secondary | ICD-10-CM

## 2022-10-31 DIAGNOSIS — Z8759 Personal history of other complications of pregnancy, childbirth and the puerperium: Secondary | ICD-10-CM

## 2022-10-31 NOTE — Progress Notes (Signed)
   PRENATAL VISIT NOTE  Subjective:  Paula Gilbert is a 32 y.o. [id number] at [redacted]w[redacted]d being seen today for ongoing prenatal care.  She is currently monitored for the following issues for this low-risk pregnancy and has History of pre-eclampsia; UTI in pregnancy, antepartum, second trimester; Supervision of other normal pregnancy, antepartum; History of prior pregnancy with IUGR newborn; Low implantation of placenta; Tobacco use complicating pregnancy; Carrier of spinal muscular atrophy; Uterine fibroids affecting pregnancy; and GBS (group B Streptococcus carrier), +RV culture, currently pregnant on their problem list.  Patient reports contractions since last night. Patient reports contractions at 2 am that were about 8 mins apart. She endorses some occasional contractions throughout the day, but nothing consistent. She request to have her cervix checked today.   .  Contractions: Irregular. Vag. Bleeding: None.  Movement: Present. Denies leaking of fluid.   The following portions of the patient's history were reviewed and updated as appropriate: allergies, current medications, past family history, past medical history, past social history, past surgical history and problem list.   Objective:   Vitals:   10/31/22 0935  BP: 111/76  Pulse: (!) 111  Weight: 147 lb (66.7 kg)    Fetal Status: Fetal Heart Rate (bpm): 154 Fundal Height: 36 cm Movement: Present  Presentation: Vertex  General:  Alert, oriented and cooperative. Patient is in no acute distress.  Skin: Skin is warm and dry. No rash noted.   Cardiovascular: Normal heart rate noted  Respiratory: Normal respiratory effort, no problems with respiration noted  Abdomen: Soft, gravid, appropriate for gestational age.  Pain/Pressure: Present     Pelvic: Cervical exam deferred Dilation: 2 Effacement (%): 60 Station: -1  Extremities: Normal range of motion.     Mental Status: Normal mood and affect. Normal behavior. Normal judgment and thought  content.   Assessment and Plan:  Pregnancy: [id number] at 110w4d 1. Encounter for supervision of low-risk pregnancy in third trimester - Patient doing well.  - Reports vigorous and frequent fetal movement.   2. History of pre-eclampsia - BP stable at this time   3. History of IUGR (intrauterine growth retardation) and stillbirth, currently pregnant, second trimester - Fundal height appropriate for gestational age.   4. [redacted] weeks gestation of pregnancy - Things to Try After 37 weeks to Encourage Labor/Get Ready for Labor:  - Reviewed labor readiness with patient including the [employer], evening primrose oil, and raspberry leaf tea.    Term labor symptoms and general obstetric precautions including but not limited to vaginal bleeding, contractions, leaking of fluid and fetal movement were reviewed in detail with the patient. Please refer to After Visit Summary for other counseling recommendations.   Return in about 1 week (around 11/07/2022) for LOB.  Future Appointments  Date Time Provider Department Center  11/05/2022  9:00 AM CENTERING PROVIDER [facility]  11/12/2022  9:00 AM CENTERING PROVIDER [facility]  11/20/2022 10:15 AM Sue Lush, FNP [facility]  12/03/2022  9:00 AM CENTERING PROVIDER [facility]    Seniah Lawrence Danella Deis) Suzie Portela, MSN, CNM  Center for [facility]  10/31/2022 9:57 AM

## 2022-11-01 ENCOUNTER — Inpatient Hospital Stay (HOSPITAL_COMMUNITY)
Admission: AD | Admit: 2022-11-01 | Discharge: 2022-11-01 | Disposition: A | Discharge status: Home / Self Care | Payer: Medicaid Other | Attending: Obstetrics and Gynecology | Admitting: Obstetrics and Gynecology

## 2022-11-01 ENCOUNTER — Other Ambulatory Visit: Payer: Self-pay

## 2022-11-01 ENCOUNTER — Encounter (HOSPITAL_COMMUNITY): Payer: Self-pay | Admitting: Obstetrics and Gynecology

## 2022-11-01 DIAGNOSIS — O99333 Smoking (tobacco) complicating pregnancy, third trimester: Secondary | ICD-10-CM

## 2022-11-01 DIAGNOSIS — O479 False labor, unspecified: Secondary | ICD-10-CM | POA: Diagnosis not present

## 2022-11-01 DIAGNOSIS — Z8759 Personal history of other complications of pregnancy, childbirth and the puerperium: Secondary | ICD-10-CM

## 2022-11-01 DIAGNOSIS — Z3A37 37 weeks gestation of pregnancy: Secondary | ICD-10-CM | POA: Insufficient documentation

## 2022-11-01 DIAGNOSIS — O99820 Streptococcus B carrier state complicating pregnancy: Secondary | ICD-10-CM

## 2022-11-01 DIAGNOSIS — O471 False labor at or after 37 completed weeks of gestation: Secondary | ICD-10-CM | POA: Diagnosis present

## 2022-11-01 DIAGNOSIS — Z3480 Encounter for supervision of other normal pregnancy, unspecified trimester: Secondary | ICD-10-CM

## 2022-11-01 NOTE — MAU Note (Signed)
.  Paula Gilbert is a 32 y.o. at [redacted]w[redacted]d here in MAU reporting: ctx started about an hour ago 5 min apart. Denies any vag bleeding or leaking. Good fetal movement felt. 3cm yesterday.  Onset of complaint: 1 hour Pain score: 5-6 Vitals:   11/01/22 1953  BP: 111/79  Pulse: (!) 113  Resp: 18  Temp: 98.1 F (36.7 C)     FHT:134 Lab orders placed from triage:

## 2022-11-01 NOTE — Progress Notes (Signed)
S: [name] is a 32 y.o. [phone] at [redacted]w[redacted]d  who presents to MAU today complaining contractions that started an hour ago that are 5 minutes apart. She denies vaginal bleeding. She denies LOF. She reports normal fetal movement.    O: BP 117/80   Pulse 90   Temp 98.1 F (36.7 C)   Resp 16   Ht 5\' 7"  (1.702 m)   Wt 67.6 kg   LMP 02/01/2022   BMI 23.34 kg/m   Cervical exam:  Dilation: 2 Effacement (%): 60 Cervical Position: Middle Station: -2 Presentation: Vertex Exam by:: Karl Ito, rnc  Fetal Monitoring: Baseline: 130bpm Variability: Moderate Accelerations: Present Decelerations: Absent Contractions: q34mins  A: SIUP at [redacted]w[redacted]d  False labor  P: -MAU return precautions -Follow up with OB for routine visits  Autry-Lott, Zeeva Courser, DO 11/01/2022 9:06 PM

## 2022-11-01 NOTE — Discharge Instructions (Signed)
Deep breathing, focusing on pleasant things, movement and walking, heating pads or cold compress, massage and relaxation, continuous support from someone you trust, and Doulas ° °

## 2022-11-02 ENCOUNTER — Other Ambulatory Visit: Payer: Self-pay

## 2022-11-02 ENCOUNTER — Encounter (HOSPITAL_COMMUNITY): Payer: Self-pay | Admitting: Obstetrics and Gynecology

## 2022-11-02 ENCOUNTER — Inpatient Hospital Stay (HOSPITAL_COMMUNITY)
Admission: AD | Admit: 2022-11-02 | Discharge: 2022-11-02 | Disposition: A | Discharge status: Home / Self Care | Payer: Medicaid Other | Attending: Obstetrics and Gynecology | Admitting: Obstetrics and Gynecology

## 2022-11-02 DIAGNOSIS — Z3689 Encounter for other specified antenatal screening: Secondary | ICD-10-CM | POA: Diagnosis not present

## 2022-11-02 DIAGNOSIS — Z87891 Personal history of nicotine dependence: Secondary | ICD-10-CM | POA: Insufficient documentation

## 2022-11-02 DIAGNOSIS — O368130 Decreased fetal movements, third trimester, not applicable or unspecified: Secondary | ICD-10-CM | POA: Insufficient documentation

## 2022-11-02 DIAGNOSIS — O479 False labor, unspecified: Secondary | ICD-10-CM | POA: Diagnosis not present

## 2022-11-02 DIAGNOSIS — Z3A37 37 weeks gestation of pregnancy: Secondary | ICD-10-CM | POA: Diagnosis not present
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
## 2022-11-02 NOTE — MAU Note (Signed)
Paula Gilbert is a 32 y.o. at [redacted]w[redacted]d here in MAU reporting: DFM starting today. States she has only felt one movement this morning even after drinking juice.  Denies any CTX, LOF or VB.   Onset of complaint: today  Pain score: 0 Vitals:   11/02/22 1020  BP: 116/82  Pulse: (!) 110  Resp: 17  Temp: 98.4 F (36.9 C)     FHT:155 Lab orders placed from triage:

## 2022-11-05 ENCOUNTER — Ambulatory Visit: Payer: Medicaid Other | Admitting: Family Medicine

## 2022-11-05 VITALS — BP 107/77 | HR 111 | Wt 150.4 lb

## 2022-11-05 DIAGNOSIS — O99333 Smoking (tobacco) complicating pregnancy, third trimester: Secondary | ICD-10-CM

## 2022-11-05 DIAGNOSIS — Z3A38 38 weeks gestation of pregnancy: Secondary | ICD-10-CM

## 2022-11-05 DIAGNOSIS — O99820 Streptococcus B carrier state complicating pregnancy: Secondary | ICD-10-CM

## 2022-11-05 DIAGNOSIS — Z8759 Personal history of other complications of pregnancy, childbirth and the puerperium: Secondary | ICD-10-CM

## 2022-11-05 DIAGNOSIS — Z3480 Encounter for supervision of other normal pregnancy, unspecified trimester: Secondary | ICD-10-CM

## 2022-11-05 NOTE — Progress Notes (Signed)
   PRENATAL VISIT NOTE  Subjective:  Paula Gilbert is a 32 y.o. [id number] at [redacted]w[redacted]d being seen today for ongoing prenatal care.  She is currently monitored for the following issues for this low-risk pregnancy and has History of pre-eclampsia; UTI in pregnancy, antepartum, second trimester; Supervision of other normal pregnancy, antepartum; History of prior pregnancy with IUGR newborn; Low implantation of placenta-RESOLVED 4/24; Tobacco use complicating pregnancy; Carrier of spinal muscular atrophy; Uterine fibroids affecting pregnancy; and GBS (group B Streptococcus carrier), +RV culture, currently pregnant on their problem list.  Patient reports no complaints.  Contractions: Irregular.  .  Movement: Present. Denies leaking of fluid.   The following portions of the patient's history were reviewed and updated as appropriate: allergies, current medications, past family history, past medical history, past social history, past surgical history and problem list.   Objective:   Vitals:   11/05/22 1145  BP: 107/77  Pulse: (!) 111  Weight: 150 lb 6.4 oz (68.2 kg)    Fetal Status: Fetal Heart Rate (bpm): 158 Fundal Height: 38 cm Movement: Present  Presentation: Vertex  General:  Alert, oriented and cooperative. Patient is in no acute distress.  Skin: Skin is warm and dry. No rash noted.   Cardiovascular: Normal heart rate noted  Respiratory: Normal respiratory effort, no problems with respiration noted  Abdomen: Soft, gravid, appropriate for gestational age.  Pain/Pressure: Absent     Pelvic: Cervical exam deferred        Extremities: Normal range of motion.     Mental Status: Normal mood and affect. Normal behavior. Normal judgment and thought content.   Assessment and Plan:  Pregnancy: [id number] at [redacted]w[redacted]d 1. GBS (group B Streptococcus carrier), +RV culture, currently pregnant PCN in labor  2. History of prior pregnancy with IUGR newborn  3. Supervision of other normal pregnancy,  antepartum   Centering Pregnancy, Session#8: Reviewed resources in [employer].   Facilitated discussion today:  Newborn safety, delivery planning, postpartum planning and follow other topics (family planning, breastfeeding)  Fundal height and FHR appropriate today unless noted otherwise in plan. Patient to continue group care.   4. Pregnancy complicated by tobacco use in third trimester Continues to smoke  Term labor symptoms and general obstetric precautions including but not limited to vaginal bleeding, contractions, leaking of fluid and fetal movement were reviewed in detail with the patient. Please refer to After Visit Summary for other counseling recommendations.   Return in about 1 week (around 11/12/2022) for Routine prenatal care.  Future Appointments  Date Time Provider Department Center  11/20/2022  9:15 AM [facility]  11/20/2022 10:15 AM Sue Lush, FNP [facility]    Federico Flake, MD

## 2022-11-12 ENCOUNTER — Encounter: Payer: Medicaid Other | Admitting: Family Medicine

## 2022-11-12 NOTE — Progress Notes (Deleted)
   PRENATAL VISIT NOTE Centering Pregnancy Group 11, Session 9   Subjective:  Paula Gilbert is a 32 y.o. [id number] at [redacted]w[redacted]d being seen today for ongoing prenatal care.  She is currently monitored for the following issues for this low-risk pregnancy and has History of pre-eclampsia; UTI in pregnancy, antepartum, second trimester; Supervision of other normal pregnancy, antepartum; History of prior pregnancy with IUGR newborn; Low implantation of placenta-RESOLVED 4/24; Tobacco use complicating pregnancy; Carrier of spinal muscular atrophy; Uterine fibroids affecting pregnancy; and GBS (group B Streptococcus carrier), +RV culture, currently pregnant on their problem list.  Patient reports {sx:[id number]}.   .  .   . Denies leaking of fluid.   The following portions of the patient's history were reviewed and updated as appropriate: allergies, current medications, past family history, past medical history, past social history, past surgical history and problem list.   Objective:  There were no vitals filed for this visit.  Fetal Status:           General:  Alert, oriented and cooperative. Patient is in no acute distress.  Skin: Skin is warm and dry. No rash noted.   Cardiovascular: Normal heart rate noted  Respiratory: Normal respiratory effort, no problems with respiration noted  Abdomen: Soft, gravid, appropriate for gestational age.        Pelvic: {Blank single:19197::"Cervical exam performed in the presence of a chaperone","Cervical exam deferred"}        Extremities: Normal range of motion.     Mental Status: Normal mood and affect. Normal behavior. Normal judgment and thought content.   Assessment and Plan:  Pregnancy: [id number] at [redacted]w[redacted]d 1. Leiomyoma of uterus affecting pregnancy in third trimester ***  2. GBS (group B Streptococcus carrier), +RV culture, currently pregnant ***  3. History of pre-eclampsia ***  4. History of prior pregnancy with IUGR newborn ***  5. Supervision of  other normal pregnancy, antepartum   6. Pregnancy complicated by tobacco use in third trimester ***  7. Low implantation of placenta-RESOLVED 4/24 ***  {Blank single:19197::"Term","Preterm"} labor symptoms and general obstetric precautions including but not limited to vaginal bleeding, contractions, leaking of fluid and fetal movement were reviewed in detail with the patient. Please refer to After Visit Summary for other counseling recommendations.   No follow-ups on file.  Future Appointments  Date Time Provider Department Center  11/12/2022  9:00 AM Federico Flake, MD [facility]  11/20/2022 10:15 AM Sue Lush, FNP [facility]  12/03/2022  9:00 AM CENTERING PROVIDER [facility]    Federico Flake, MD

## 2022-11-16 ENCOUNTER — Inpatient Hospital Stay (HOSPITAL_COMMUNITY): Payer: Medicaid Other | Admitting: Anesthesiology

## 2022-11-16 ENCOUNTER — Encounter (HOSPITAL_COMMUNITY): Payer: Self-pay | Admitting: Family Medicine

## 2022-11-16 ENCOUNTER — Other Ambulatory Visit: Payer: Self-pay

## 2022-11-16 ENCOUNTER — Inpatient Hospital Stay (HOSPITAL_COMMUNITY)
Admission: AD | Admit: 2022-11-16 | Discharge: 2022-11-18 | DRG: 807 | Disposition: A | Discharge status: Home / Self Care | Payer: Medicaid Other | Attending: Obstetrics & Gynecology | Admitting: Obstetrics & Gynecology

## 2022-11-16 DIAGNOSIS — Z8759 Personal history of other complications of pregnancy, childbirth and the puerperium: Secondary | ICD-10-CM

## 2022-11-16 DIAGNOSIS — O3413 Maternal care for benign tumor of corpus uteri, third trimester: Secondary | ICD-10-CM | POA: Diagnosis present

## 2022-11-16 DIAGNOSIS — Z148 Genetic carrier of other disease: Secondary | ICD-10-CM | POA: Diagnosis not present

## 2022-11-16 DIAGNOSIS — Z3A39 39 weeks gestation of pregnancy: Secondary | ICD-10-CM

## 2022-11-16 DIAGNOSIS — O99824 Streptococcus B carrier state complicating childbirth: Secondary | ICD-10-CM | POA: Diagnosis present

## 2022-11-16 DIAGNOSIS — D259 Leiomyoma of uterus, unspecified: Secondary | ICD-10-CM | POA: Diagnosis present

## 2022-11-16 DIAGNOSIS — Z3480 Encounter for supervision of other normal pregnancy, unspecified trimester: Secondary | ICD-10-CM

## 2022-11-16 DIAGNOSIS — O26893 Other specified pregnancy related conditions, third trimester: Secondary | ICD-10-CM | POA: Diagnosis present

## 2022-11-16 DIAGNOSIS — O99820 Streptococcus B carrier state complicating pregnancy: Secondary | ICD-10-CM

## 2022-11-16 DIAGNOSIS — O99330 Smoking (tobacco) complicating pregnancy, unspecified trimester: Secondary | ICD-10-CM | POA: Diagnosis present

## 2022-11-16 DIAGNOSIS — O99334 Smoking (tobacco) complicating childbirth: Secondary | ICD-10-CM | POA: Diagnosis not present

## 2022-11-16 DIAGNOSIS — Z87891 Personal history of nicotine dependence: Secondary | ICD-10-CM

## 2022-11-16 DIAGNOSIS — O1002 Pre-existing essential hypertension complicating childbirth: Secondary | ICD-10-CM | POA: Diagnosis not present

## 2022-11-16 DIAGNOSIS — O2342 Unspecified infection of urinary tract in pregnancy, second trimester: Secondary | ICD-10-CM | POA: Diagnosis present

## 2022-11-16 LAB — RPR: RPR Ser Ql: NONREACTIVE

## 2022-11-16 LAB — CBC
HCT: 35.7 % — ABNORMAL LOW (ref 36.0–46.0)
Hemoglobin: 12.1 g/dL (ref 12.0–15.0)
MCH: 30.9 pg (ref 26.0–34.0)
MCHC: 33.9 g/dL (ref 30.0–36.0)
MCV: 91.3 fL (ref 80.0–100.0)
Platelets: 338 10*3/uL (ref 150–400)
RBC: 3.91 MIL/uL (ref 3.87–5.11)
RDW: 13.4 % (ref 11.5–15.5)
WBC: 14.1 10*3/uL — ABNORMAL HIGH (ref 4.0–10.5)
nRBC: 0.1 % (ref 0.0–0.2)

## 2022-11-16 LAB — TYPE AND SCREEN
ABO/RH(D): B POS
Antibody Screen: NEGATIVE

## 2022-11-16 MED ORDER — LACTATED RINGERS IV SOLN: INTRAVENOUS | Status: DC

## 2022-11-16 MED ORDER — DIBUCAINE (PERIANAL) 1 % EX OINT: 1.0000 | TOPICAL_OINTMENT | CUTANEOUS | Status: DC | PRN

## 2022-11-16 MED ORDER — PHENYLEPHRINE 80 MCG/ML (10ML) SYRINGE FOR IV PUSH (FOR BLOOD PRESSURE SUPPORT): 80.0000 ug | PREFILLED_SYRINGE | INTRAVENOUS | Status: DC | PRN

## 2022-11-16 MED ORDER — LIDOCAINE HCL (PF) 1 % IJ SOLN
INTRAMUSCULAR | Status: DC | PRN
  Administered 2022-11-16: 3 mL via EPIDURAL
  Administered 2022-11-16: 5 mL via EPIDURAL

## 2022-11-16 MED ORDER — EPHEDRINE 5 MG/ML INJ: 10.0000 mg | INTRAVENOUS | Status: DC | PRN

## 2022-11-16 MED ORDER — ONDANSETRON HCL 4 MG/2ML IJ SOLN: 4.0000 mg | INTRAMUSCULAR | Status: DC | PRN

## 2022-11-16 MED ORDER — OXYTOCIN BOLUS FROM INFUSION
333.0000 mL | Freq: Once | INTRAVENOUS | Status: AC
  Administered 2022-11-16: 333 mL via INTRAVENOUS

## 2022-11-16 MED ORDER — LACTATED RINGERS IV SOLN: 500.0000 mL | Freq: Once | INTRAVENOUS | Status: DC

## 2022-11-16 MED ORDER — OXYTOCIN-SODIUM CHLORIDE 30-0.9 UT/500ML-% IV SOLN
2.5000 [IU]/h | INTRAVENOUS | Status: DC
  Filled 2022-11-16: qty 500

## 2022-11-16 MED ORDER — BENZOCAINE-MENTHOL 20-0.5 % EX AERO: 1.0000 | INHALATION_SPRAY | CUTANEOUS | Status: DC | PRN

## 2022-11-16 MED ORDER — ONDANSETRON HCL 4 MG PO TABS: 4.0000 mg | ORAL_TABLET | ORAL | Status: DC | PRN

## 2022-11-16 MED ORDER — DIPHENHYDRAMINE HCL 50 MG/ML IJ SOLN: 12.5000 mg | INTRAMUSCULAR | Status: DC | PRN

## 2022-11-16 MED ORDER — OXYCODONE-ACETAMINOPHEN 5-325 MG PO TABS: 1.0000 | ORAL_TABLET | ORAL | Status: DC | PRN

## 2022-11-16 MED ORDER — SODIUM CHLORIDE 0.9 % IV SOLN: 5.0000 10*6.[IU] | Freq: Once | INTRAVENOUS | Status: DC

## 2022-11-16 MED ORDER — LACTATED RINGERS IV SOLN: 500.0000 mL | INTRAVENOUS | Status: DC | PRN

## 2022-11-16 MED ORDER — ACETAMINOPHEN 325 MG PO TABS: 650.0000 mg | ORAL_TABLET | ORAL | Status: DC | PRN

## 2022-11-16 MED ORDER — SODIUM CHLORIDE 0.9 % IV SOLN
2.0000 g | Freq: Once | INTRAVENOUS | Status: AC
  Administered 2022-11-16: 2 g via INTRAVENOUS
  Filled 2022-11-16: qty 2000

## 2022-11-16 MED ORDER — COCONUT OIL OIL
1.0000 | TOPICAL_OIL | Status: DC | PRN
  Administered 2022-11-17: 1 via TOPICAL

## 2022-11-16 MED ORDER — DIPHENHYDRAMINE HCL 25 MG PO CAPS: 25.0000 mg | ORAL_CAPSULE | Freq: Four times a day (QID) | ORAL | Status: DC | PRN

## 2022-11-16 MED ORDER — FENTANYL-BUPIVACAINE-NACL 0.5-0.125-0.9 MG/250ML-% EP SOLN
12.0000 mL/h | EPIDURAL | Status: DC | PRN
  Administered 2022-11-16: 12 mL/h via EPIDURAL
  Filled 2022-11-16: qty 250

## 2022-11-16 MED ORDER — FLEET ENEMA 7-19 GM/118ML RE ENEM: 1.0000 | ENEMA | RECTAL | Status: DC | PRN

## 2022-11-16 MED ORDER — ZOLPIDEM TARTRATE 5 MG PO TABS: 5.0000 mg | ORAL_TABLET | Freq: Every evening | ORAL | Status: DC | PRN

## 2022-11-16 MED ORDER — FENTANYL CITRATE (PF) 100 MCG/2ML IJ SOLN
50.0000 ug | INTRAMUSCULAR | Status: DC | PRN
  Administered 2022-11-16: 100 ug via INTRAVENOUS
  Filled 2022-11-16: qty 2

## 2022-11-16 MED ORDER — PENICILLIN G POT IN DEXTROSE 60000 UNIT/ML IV SOLN: 3.0000 10*6.[IU] | INTRAVENOUS | Status: DC

## 2022-11-16 MED ORDER — WITCH HAZEL-GLYCERIN EX PADS: 1.0000 | MEDICATED_PAD | CUTANEOUS | Status: DC | PRN

## 2022-11-16 MED ORDER — SODIUM CHLORIDE 0.9 % IV SOLN: 1.0000 g | INTRAVENOUS | Status: DC

## 2022-11-16 MED ORDER — PRENATAL MULTIVITAMIN CH
1.0000 | ORAL_TABLET | Freq: Every day | ORAL | Status: DC
  Administered 2022-11-16 – 2022-11-18 (×3): 1 via ORAL
  Filled 2022-11-16 (×3): qty 1

## 2022-11-16 MED ORDER — TETANUS-DIPHTH-ACELL PERTUSSIS 5-2.5-18.5 LF-MCG/0.5 IM SUSY: 0.5000 mL | PREFILLED_SYRINGE | Freq: Once | INTRAMUSCULAR | Status: DC

## 2022-11-16 MED ORDER — OXYCODONE HCL 5 MG PO TABS
10.0000 mg | ORAL_TABLET | Freq: Four times a day (QID) | ORAL | Status: DC | PRN
  Administered 2022-11-17 – 2022-11-18 (×3): 10 mg via ORAL
  Filled 2022-11-16 (×3): qty 2

## 2022-11-16 MED ORDER — SIMETHICONE 80 MG PO CHEW: 80.0000 mg | CHEWABLE_TABLET | ORAL | Status: DC | PRN

## 2022-11-16 MED ORDER — IBUPROFEN 600 MG PO TABS
600.0000 mg | ORAL_TABLET | Freq: Four times a day (QID) | ORAL | Status: DC
  Administered 2022-11-16 – 2022-11-18 (×9): 600 mg via ORAL
  Filled 2022-11-16 (×9): qty 1

## 2022-11-16 MED ORDER — SENNOSIDES-DOCUSATE SODIUM 8.6-50 MG PO TABS
2.0000 | ORAL_TABLET | ORAL | Status: DC
  Administered 2022-11-16 – 2022-11-18 (×3): 2 via ORAL
  Filled 2022-11-16 (×3): qty 2

## 2022-11-16 MED ORDER — SOD CITRATE-CITRIC ACID 500-334 MG/5ML PO SOLN: 30.0000 mL | ORAL | Status: DC | PRN

## 2022-11-16 MED ORDER — OXYCODONE-ACETAMINOPHEN 5-325 MG PO TABS: 2.0000 | ORAL_TABLET | ORAL | Status: DC | PRN

## 2022-11-16 MED ORDER — ACETAMINOPHEN 325 MG PO TABS
650.0000 mg | ORAL_TABLET | ORAL | Status: DC | PRN
  Administered 2022-11-16 – 2022-11-17 (×3): 650 mg via ORAL
  Filled 2022-11-16 (×3): qty 2

## 2022-11-16 MED ORDER — ONDANSETRON HCL 4 MG/2ML IJ SOLN: 4.0000 mg | Freq: Four times a day (QID) | INTRAMUSCULAR | Status: DC | PRN

## 2022-11-16 MED ORDER — LIDOCAINE HCL (PF) 1 % IJ SOLN: 30.0000 mL | INTRAMUSCULAR | Status: DC | PRN

## 2022-11-16 MED ORDER — OXYCODONE HCL 5 MG PO TABS
5.0000 mg | ORAL_TABLET | Freq: Once | ORAL | Status: AC
  Administered 2022-11-16: 5 mg via ORAL
  Filled 2022-11-16: qty 1

## 2022-11-16 NOTE — Progress Notes (Signed)
Called Dr. Nobie Putnam concerning the patient. Patient stated that she was having a pain level of 7 in the pelvic area. Patient stated that Tylenol nor Ibuprofen was helping with her pain. Dr. Nobie Putnam verbal ordered one time dose of oxycodone.

## 2022-11-16 NOTE — Anesthesia Preprocedure Evaluation (Addendum)
Anesthesia Evaluation  Patient identified by MRN, date of birth, ID band Patient awake    Reviewed: Allergy & Precautions, NPO status , Patient's Chart, lab work & pertinent test results  History of Anesthesia Complications Negative for: history of anesthetic complications  Airway Mallampati: II  TM Distance: >3 FB Neck ROM: Full    Dental   Pulmonary Current Smoker, former smoker   Pulmonary exam normal breath sounds clear to auscultation       Cardiovascular negative cardio ROS  Rhythm:Regular Rate:Normal     Neuro/Psych negative neurological ROS     GI/Hepatic negative GI ROS, Neg liver ROS,,,  Endo/Other  negative endocrine ROS    Renal/GU negative Renal ROS     Musculoskeletal   Abdominal   Peds  Hematology negative hematology ROS (+)   Anesthesia Other Findings   Reproductive/Obstetrics (+) Pregnancy fibroids                             Anesthesia Physical Anesthesia Plan  ASA: 2  Anesthesia Plan: Epidural   Post-op Pain Management:    Induction:   PONV Risk Score and Plan:   Airway Management Planned:   Additional Equipment:   Intra-op Plan:   Post-operative Plan:   Informed Consent: I have reviewed the patients History and Physical, chart, labs and discussed the procedure including the risks, benefits and alternatives for the proposed anesthesia with the patient or authorized representative who has indicated his/her understanding and acceptance.       Plan Discussed with: Anesthesiologist  Anesthesia Plan Comments: (I have discussed risks of neuraxial anesthesia including but not limited to infection, bleeding, nerve injury, back pain, headache, seizures, and failure of block. Patient denies bleeding disorders and is not currently anticoagulated. Labs have been reviewed. Risks and benefits discussed. All patient's questions answered.  )         Anesthesia Quick Evaluation

## 2022-11-16 NOTE — Anesthesia Procedure Notes (Signed)
Epidural Patient location during procedure: OB Start time: 11/16/2022 7:25 AM End time: 11/16/2022 7:30 AM  Staffing Anesthesiologist: Linton Rump, MD Performed: anesthesiologist   Preanesthetic Checklist Completed: patient identified, IV checked, site marked, risks and benefits discussed, surgical consent, monitors and equipment checked, pre-op evaluation and timeout performed  Epidural Patient position: sitting Prep: DuraPrep and site prepped and draped Patient monitoring: continuous pulse ox and blood pressure Approach: midline Location: L3-L4 Injection technique: LOR saline  Needle:  Needle type: Tuohy  Needle gauge: 17 G Needle length: 9 cm and 9 Needle insertion depth: 4 cm Catheter type: closed end flexible Catheter size: 19 Gauge Catheter at skin depth: 8 cm Test dose: negative  Assessment Events: blood not aspirated, no cerebrospinal fluid, injection not painful, no injection resistance, no paresthesia and negative IV test  Additional Notes The patient has requested an epidural for labor pain management. Risks and benefits including, but not limited to, infection, bleeding, local anesthetic toxicity, headache, hypotension, back pain, block failure, etc. were discussed with the patient. The patient expressed understanding and consented to the procedure. I confirmed that the patient has no bleeding disorders and is not taking blood thinners. I confirmed the patient's last platelet count with the nurse. A time-out was performed immediately prior to the procedure. Please see nursing documentation for vital signs. Sterile technique was used throughout the whole procedure. Once LOR achieved, the epidural catheter threaded easily without resistance. Aspiration of the catheter was negative for blood and CSF. The epidural was dosed slowly and an infusion was started.  1 attempt(s)Reason for block:procedure for pain
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
## 2022-11-16 NOTE — Discharge Summary (Signed)
Postpartum Discharge Summary      Patient Name: Paula Gilbert DOB: 18-Nov-1990 MRN: [id number]  Date of admission: 11/16/2022 Delivery date:11/16/2022 Delivering provider: Celedonio Savage Date of discharge: 11/18/2022  Admitting diagnosis: Normal labor [O80, Z37.9] Intrauterine pregnancy: [redacted]w[redacted]d     Secondary diagnosis:  Active Problems:   History of pre-eclampsia   UTI in pregnancy, antepartum, second trimester   Supervision of other normal pregnancy, antepartum   History of prior pregnancy with IUGR newborn   Tobacco use complicating pregnancy   Carrier of spinal muscular atrophy   Uterine fibroids affecting pregnancy   GBS (group B Streptococcus carrier), +RV culture, currently pregnant   Vaginal delivery  Additional problems: CHTN    Discharge diagnosis: Term Pregnancy Delivered                                              Post partum procedures: none Augmentation: N/A Complications: None  Hospital course: Onset of Labor With Vaginal Delivery      32 y.o. yo [id number] at [redacted]w[redacted]d was admitted in Active Labor on 11/16/2022. Labor course was uncomplicated.  Membrane Rupture Time/Date: 8:30 AM,11/16/2022  Delivery Method:Vaginal, Spontaneous Episiotomy: None Lacerations:  None Patient had a postpartum course complicated by [facility].  She is ambulating, tolerating a regular diet, passing flatus, and urinating well. Patient is discharged home in stable condition on 11/18/22.  Newborn Data: Birth date:11/16/2022 Birth time:8:49 AM Gender:Female Living status:Living Apgars:7 ,9  Weight:3572 g  Magnesium Sulfate received: No BMZ received: No Rhophylac:N/A MMR:N/A T-DaP:Given prenatally Flu: N/A Transfusion:No  Physical exam  Vitals:   11/17/22 1300 11/17/22 2100 11/17/22 2215 11/18/22 0459  BP: 113/84 (!) 123/90 (!) 114/95 (!) 124/90  Pulse: 72 65  70  Resp:    18  Temp: 98.2 F (36.8 C) 97.9 F (36.6 C)  97.6 F (36.4 C)  TempSrc: Oral Oral  Oral  SpO2: 99%    100%   General: alert, cooperative, and no distress Lochia: appropriate Uterine Fundus: firm Incision: N/A DVT Evaluation: No evidence of DVT seen on physical exam. Negative Homan's sign. No cords or calf tenderness. Labs: Lab Results  Component Value Date   WBC 14.1 (H) 11/16/2022   HGB 12.1 11/16/2022   HCT 35.7 (L) 11/16/2022   MCV 91.3 11/16/2022   PLT 338 11/16/2022      Latest Ref Rng & Units 10/03/2022    3:08 PM  CMP  Glucose 70 - 99 mg/dL 90   BUN 6 - 20 mg/dL <5   Creatinine 3.24 - 1.00 mg/dL 4.01   Sodium 027 - 253 mmol/L 132   Potassium 3.5 - 5.1 mmol/L 3.0   Chloride 98 - 111 mmol/L 103   CO2 22 - 32 mmol/L 22   Calcium 8.9 - 10.3 mg/dL 8.1   Total Protein 6.5 - 8.1 g/dL 5.8   Total Bilirubin 0.3 - 1.2 mg/dL 0.5   Alkaline Phos 38 - 126 U/L 166   AST 15 - 41 U/L 15   ALT 0 - 44 U/L 16    Edinburgh Score:    11/16/2022    7:34 PM  Edinburgh Postnatal Depression Scale Screening Tool  I have been able to laugh and see the funny side of things. 0  I have looked forward with enjoyment to things. 0  I have blamed myself unnecessarily when things went  wrong. 1  I have been anxious or worried for no good reason. 0  I have felt scared or panicky for no good reason. 0  Things have been getting on top of me. 1  I have been so unhappy that I have had difficulty sleeping. 0  I have felt sad or miserable. 0  I have been so unhappy that I have been crying. 0  The thought of harming myself has occurred to me. 0  Edinburgh Postnatal Depression Scale Total 2     After visit meds:  Allergies as of 11/18/2022   No Known Allergies      Medication List     STOP taking these medications    aspirin EC 81 MG tablet   Blood Pressure Monitoring Devi       TAKE these medications    furosemide 20 MG tablet Commonly known as: LASIX Take 1 tablet (20 mg total) by mouth daily for 5 days. Start taking on: November 19, 2022   ibuprofen 600 MG tablet Commonly known  as: ADVIL Take 1 tablet (600 mg total) by mouth every 6 (six) hours.   NIFEdipine 30 MG 24 hr tablet Commonly known as: ADALAT CC Take 1 tablet (30 mg total) by mouth daily. Start taking on: November 19, 2022   Prenatal Plus Vitamin/Mineral 27-1 MG Tabs Take 1 tablet by mouth daily.         Discharge home in stable condition Infant Feeding: Bottle and Breast Infant Disposition:home with mother Discharge instruction: per After Visit Summary and Postpartum booklet. Activity: Advance as tolerated. Pelvic rest for 6 weeks.  Diet: routine diet Future Appointments: No future appointments.  Follow up Visit: Message sent   Please schedule this patient for a In person postpartum visit in 4 weeks with the following provider: Any provider. Additional Postpartum F/U: None   Low risk pregnancy complicated by:  None Delivery mode:  Vaginal, Spontaneous Anticipated Birth Control:   Depo vs IUD OP   11/18/2022 Levie Heritage, DO

## 2022-11-16 NOTE — MAU Note (Signed)
.  Paula Gilbert is a 32 y.o. at [redacted]w[redacted]d here in MAU reporting: contractions that began an hour ago Reports feeling "moist" but no distinct loss of fluid + bloody show And fetal movement GBS +  Onset of complaint: 0530 Pain score: 10 lower abdomen There were no vitals filed for this visit.   FHT:145bpm Lab orders placed from triage: mau labor  SVE 5/70/-1  membranes felt on exam

## 2022-11-17 ENCOUNTER — Encounter: Payer: Self-pay | Admitting: *Deleted

## 2022-11-17 MED ORDER — MEDROXYPROGESTERONE ACETATE 150 MG/ML IM SUSP
150.0000 mg | Freq: Once | INTRAMUSCULAR | Status: AC
  Administered 2022-11-17: 150 mg via INTRAMUSCULAR
  Filled 2022-11-17: qty 1

## 2022-11-17 MED ORDER — NIFEDIPINE ER OSMOTIC RELEASE 30 MG PO TB24
30.0000 mg | ORAL_TABLET | Freq: Every day | ORAL | Status: DC
Start: 2022-11-18 — End: 2022-11-17

## 2022-11-17 MED ORDER — FUROSEMIDE 20 MG PO TABS
20.0000 mg | ORAL_TABLET | Freq: Every day | ORAL | Status: DC
Start: 2022-11-18 — End: 2022-11-17

## 2022-11-17 MED ORDER — FUROSEMIDE 20 MG PO TABS
20.0000 mg | ORAL_TABLET | Freq: Every day | ORAL | Status: DC
  Administered 2022-11-18: 20 mg via ORAL
  Filled 2022-11-17: qty 1

## 2022-11-17 MED ORDER — NIFEDIPINE ER OSMOTIC RELEASE 30 MG PO TB24
30.0000 mg | ORAL_TABLET | Freq: Every day | ORAL | Status: DC
  Administered 2022-11-18: 30 mg via ORAL
  Filled 2022-11-17: qty 1

## 2022-11-17 NOTE — Lactation Note (Signed)
This note was copied from a baby's chart. Lactation Consultation Note  Patient Name: Paula Gilbert [id number] Date: 11/17/2022 Age:32 hours Reason for consult: Initial assessment;Term Mom stated the baby has been BF good. Mom does have a bruise on her Rt. Nipple where he slipped off on the end of her nipple and wasn't on there well. Mom knows to keep him on the breast well now cheeks to breast. LC gave coconut oil. Encouraged to pat her colostrum on her nipples as well. Mom stated she is going to do breast and formula. Stated the FOB is wanting to feed the baby also. Newborn feeding habits, STS, I&O reviewed. Mom encouraged to feed baby 8-12 times/24 hours and with feeding cues.  Mom stated she is cramping, getting sleepy and thirsty when she BF. Mom has no further questions and doesn't feel that she needs any assistance at this time. Encouraged to call if she changes her mind.  Maternal Data Has patient been taught Hand Expression?: Yes Does the patient have breastfeeding experience prior to this delivery?: Yes How long did the patient breastfeed?: 3 months each to her 66 to 33 yr old  Feeding Mother's Current Feeding Choice: Breast Milk and Formula  LATCH Score Latch: Grasps breast easily, tongue down, lips flanged, rhythmical sucking.  Audible Swallowing: A few with stimulation  Type of Nipple: Everted at rest and after stimulation  Comfort (Breast/Nipple): Filling, red/small blisters or bruises, mild/mod discomfort (bruised Rt. nipple)  Hold (Positioning): No assistance needed to correctly position infant at breast.  LATCH Score: 8   Lactation Tools Discussed/Used Tools: Coconut oil  Interventions Interventions: Breast feeding basics reviewed;Skin to skin;LC Services brochure;Breast compression;Coconut oil  Discharge    Consult Status Consult Status: Complete Date: 11/17/22    Charyl Dancer 11/17/2022, 4:47 AM

## 2022-11-17 NOTE — Anesthesia Postprocedure Evaluation (Signed)
Anesthesia Post Note  Patient: Paula Gilbert  Procedure(s) Performed: AN AD HOC LABOR EPIDURAL     Patient location during evaluation: Mother Baby Anesthesia Type: Epidural Level of consciousness: awake, oriented and awake and alert Pain management: pain level controlled Vital Signs Assessment: post-procedure vital signs reviewed and stable Respiratory status: spontaneous breathing, respiratory function stable and nonlabored ventilation Cardiovascular status: stable Postop Assessment: no headache, adequate PO intake, patient able to bend at knees, able to ambulate and no apparent nausea or vomiting Anesthetic complications: no   No notable events documented.  Last Vitals:  Vitals:   11/16/22 2347 11/17/22 0515  BP: 106/76 123/82  Pulse: 87 74  Resp: 17   Temp: 36.7 C 36.7 C  SpO2: 100% 100%    Last Pain:  Vitals:   11/17/22 1115  TempSrc:   PainSc: 0-No pain   Pain Goal:                   Neomia Herbel

## 2022-11-17 NOTE — Progress Notes (Signed)
POSTPARTUM PROGRESS NOTE  Post Partum Day #1  Subjective:  Kathlean Cinco is a 32 y.o. Z6X0960 s/p NSVD at [redacted]w[redacted]d.  She reports she is doing well. No acute events overnight. She denies any problems with ambulating, voiding or po intake. Denies nausea or vomiting.  Pain is well controlled.  Lochia is less then a period.  Objective: Blood pressure 123/82, pulse 74, temperature 98 F (36.7 C), temperature source Oral, resp. rate 17, last menstrual period 02/01/2022, SpO2 100%, unknown if currently breastfeeding.  Physical Exam:  General: alert, cooperative and no distress Chest: no respiratory distress Heart:regular rate, distal pulses intact Abdomen: soft, nontender,  Uterine Fundus: firm, appropriately tender DVT Evaluation: No calf swelling or tenderness Extremities: no LE edema Skin: warm, dry  Recent Labs    11/16/22 0657  HGB 12.1  HCT 35.7*    Assessment/Plan: Areana Kosanke is a 32 y.o. [id number] s/p NSVD at [redacted]w[redacted]d   PPD#1 - Doing well  Routine postpartum care Contraception: would like depo as bridge to getting BTL Feeding: breast Dispo: Plan for discharge PPD#1-2, pending baby. OK for discharge today if desired.   LOS: 1 day    Venora Maples, MD/MPH Attending Family Medicine Physician, [facility] for [facility], [facility]  11/17/2022, 11:32 AM

## 2022-11-17 NOTE — Lactation Note (Signed)
This note was copied from a baby's chart. Lactation Consultation Note  Patient Name: Paula Gilbert [id number] Date: 11/17/2022 Age:32 hours  Attempted to see mom but she was sleeping.   Maternal Data    Feeding    LATCH Score                    Lactation Tools Discussed/Used    Interventions    Discharge    Consult Status      [name], Diamond Nickel 11/17/2022, 1:05 AM

## 2022-11-18 ENCOUNTER — Other Ambulatory Visit (HOSPITAL_COMMUNITY): Payer: Self-pay

## 2022-11-18 MED ORDER — IBUPROFEN 600 MG PO TABS
600.0000 mg | ORAL_TABLET | Freq: Four times a day (QID) | ORAL | 0 refills | Status: AC
  Filled 2022-11-18: qty 30, 8d supply, fill #0

## 2022-11-18 MED ORDER — FUROSEMIDE 20 MG PO TABS
20.0000 mg | ORAL_TABLET | Freq: Every day | ORAL | 0 refills | Status: DC
  Filled 2022-11-18 – 2022-11-20 (×2): qty 5, 5d supply, fill #0

## 2022-11-18 MED ORDER — NIFEDIPINE ER 30 MG PO TB24: 30.0000 mg | ORAL_TABLET | Freq: Every day | ORAL | 0 refills | Status: DC

## 2022-11-18 MED ORDER — NIFEDIPINE ER 30 MG PO TB24
30.0000 mg | ORAL_TABLET | Freq: Every day | ORAL | 0 refills | Status: AC
  Filled 2022-11-18 – 2022-11-20 (×2): qty 30, 30d supply, fill #0

## 2022-11-18 MED ORDER — FUROSEMIDE 20 MG PO TABS: 20.0000 mg | ORAL_TABLET | Freq: Every day | ORAL | 0 refills | Status: DC

## 2022-11-18 MED ORDER — IBUPROFEN 600 MG PO TABS: 600.0000 mg | ORAL_TABLET | Freq: Four times a day (QID) | ORAL | 0 refills | Status: DC

## 2022-11-18 NOTE — Discharge Instructions (Signed)
WHAT TO LOOK OUT FOR: Fever of 100.4 or above Mastitis: feels like flu and breasts hurt Infection: increased pain, swelling or redness Blood clots golf ball size or larger Postpartum depression   Congratulations on your newest addition!

## 2022-11-20 ENCOUNTER — Other Ambulatory Visit (HOSPITAL_COMMUNITY): Payer: Self-pay

## 2022-11-20 ENCOUNTER — Encounter: Payer: Medicaid Other | Admitting: Obstetrics and Gynecology

## 2022-11-20 ENCOUNTER — Other Ambulatory Visit: Payer: Medicaid Other

## 2022-11-22 ENCOUNTER — Encounter (HOSPITAL_COMMUNITY): Payer: Self-pay | Admitting: Obstetrics and Gynecology

## 2022-11-22 ENCOUNTER — Inpatient Hospital Stay (HOSPITAL_COMMUNITY)
Admission: AD | Admit: 2022-11-22 | Discharge: 2022-11-23 | Disposition: A | Discharge status: Home / Self Care | Payer: Medicaid Other | Source: Home / Self Care | Attending: Obstetrics and Gynecology | Admitting: Obstetrics and Gynecology

## 2022-11-22 DIAGNOSIS — N939 Abnormal uterine and vaginal bleeding, unspecified: Secondary | ICD-10-CM | POA: Insufficient documentation

## 2022-11-22 DIAGNOSIS — O3480 Maternal care for other abnormalities of pelvic organs, unspecified trimester: Secondary | ICD-10-CM

## 2022-11-22 DIAGNOSIS — O909 Complication of the puerperium, unspecified: Secondary | ICD-10-CM | POA: Insufficient documentation

## 2022-11-22 DIAGNOSIS — N8110 Cystocele, unspecified: Secondary | ICD-10-CM | POA: Insufficient documentation

## 2022-11-22 NOTE — MAU Note (Addendum)
.  Paula Gilbert is a 32 y.o. at Unknown here in MAU reporting thinking she may have a prolapsed uterus. She had it with a previous delivery.[name] she had a BM and when she wiped she felt something are the vaginal area. She took a picture to show the provider. Reports a mild headache and bleeding is scant.   Onset of complaint: tonight Pain score: 2 Vitals:   11/22/22 2341 11/22/22 2343  BP:  110/75  Pulse: 78   Resp: 17   Temp: 98 F (36.7 C)   SpO2: 100%      FHT:n/a Lab orders placed from triage:  none

## 2022-11-23 DIAGNOSIS — N939 Abnormal uterine and vaginal bleeding, unspecified: Secondary | ICD-10-CM | POA: Diagnosis not present

## 2022-11-23 DIAGNOSIS — O909 Complication of the puerperium, unspecified: Secondary | ICD-10-CM | POA: Diagnosis present

## 2022-11-23 DIAGNOSIS — N8110 Cystocele, unspecified: Secondary | ICD-10-CM | POA: Diagnosis not present
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
## 2022-11-23 NOTE — Progress Notes (Signed)
WRitten and verbal d/c instructions given and understanding voiced.  

## 2022-12-12 ENCOUNTER — Telehealth (HOSPITAL_COMMUNITY): Payer: Self-pay | Admitting: *Deleted

## 2022-12-12 NOTE — Telephone Encounter (Signed)
12/12/2022  Name: Undrea Ganzer MRN: [id number] DOB: [birth year]  Reason for Call:  Transition of Care Hospital Discharge Call  Contact Status: Patient Contact Status: Message  Language assistant needed:          Follow-Up Questions:    Inocente Salles Postnatal Depression Scale:  In the Past 7 Days:    PHQ2-9 Depression Scale:     Discharge Follow-up:    Post-discharge interventions: NA  Salena Saner, RN 12/12/2022 12:17

## 2022-12-16 ENCOUNTER — Telehealth: Payer: Self-pay

## 2022-12-16 NOTE — Telephone Encounter (Addendum)
VM left by [facility] RN Olegario Messier (callback number [phone]). Visited patient's home yesterday evening. Would like to report her BP was 118/90. Was previously taking nifedipine but has now stopped. Pt denied any HA, blurred vision, dizziness and no edema on exam.   Chart review shows pt is s/p vaginal birth on 11/16/22. Called pt to follow up. She has not taken nifedipine in 1-2 weeks due to being forgetful. Confirms she is not experiencing any symptoms. No BP cuff at home. Will come tomorrow for nurse visit to recheck BP and possibly restart nifedipine.

## 2022-12-17 ENCOUNTER — Other Ambulatory Visit: Payer: Self-pay

## 2022-12-17 ENCOUNTER — Ambulatory Visit: Payer: Medicaid Other | Admitting: *Deleted

## 2022-12-17 VITALS — BP 115/79 | HR 82 | Ht 67.0 in | Wt 147.9 lb

## 2022-12-17 DIAGNOSIS — Z0130 Encounter for examination of blood pressure without abnormal findings: Secondary | ICD-10-CM

## 2022-12-17 NOTE — Progress Notes (Signed)
Pt presents for BP check due to elevation during Home health visit on 8/12.  She had vaginal delivery on 7/14 and has not been taking Nifedipine as prescribed for past 1-2 weeks.  Pt denies H/A or visual disturbances.  BP today 115/79, P - 82.  Pt was advised to keep PP appt as scheduled on 8/28.  She voiced understanding.

## 2022-12-18 ENCOUNTER — Encounter: Payer: Self-pay | Admitting: Lactation Services

## 2022-12-18 ENCOUNTER — Telehealth: Payer: Self-pay | Admitting: Lactation Services

## 2022-12-18 NOTE — Telephone Encounter (Signed)
Opened in error

## 2022-12-29 ENCOUNTER — Encounter: Payer: Self-pay | Admitting: *Deleted

## 2022-12-31 ENCOUNTER — Ambulatory Visit: Payer: Medicaid Other | Admitting: Family Medicine

## 2023-01-12 ENCOUNTER — Encounter: Payer: Self-pay | Admitting: *Deleted
